# Patient Record
Sex: Male | Born: 1965 | Race: Black or African American | Hispanic: No | Smoking: Current every day smoker
Health system: Southern US, Community
[De-identification: ages and names within clinical notes are randomized; demographics above are authoritative.]

## PROBLEM LIST (undated history)

## (undated) DIAGNOSIS — F32A Depression, unspecified: Secondary | ICD-10-CM

## (undated) DIAGNOSIS — G35 Multiple sclerosis: Secondary | ICD-10-CM

## (undated) DIAGNOSIS — R269 Unspecified abnormalities of gait and mobility: Principal | ICD-10-CM

## (undated) DIAGNOSIS — I10 Essential (primary) hypertension: Secondary | ICD-10-CM

## (undated) DIAGNOSIS — J3089 Other allergic rhinitis: Secondary | ICD-10-CM

## (undated) DIAGNOSIS — Z8719 Personal history of other diseases of the digestive system: Secondary | ICD-10-CM

## (undated) DIAGNOSIS — G8250 Quadriplegia, unspecified: Secondary | ICD-10-CM

## (undated) DIAGNOSIS — N400 Enlarged prostate without lower urinary tract symptoms: Secondary | ICD-10-CM

## (undated) DIAGNOSIS — F329 Major depressive disorder, single episode, unspecified: Secondary | ICD-10-CM

## (undated) HISTORY — DX: Unspecified abnormalities of gait and mobility: R26.9

## (undated) HISTORY — PX: APPENDECTOMY: SHX54

## (undated) HISTORY — PX: HERNIA REPAIR: SHX51

## (undated) HISTORY — PX: OTHER SURGICAL HISTORY: SHX169

## (undated) HISTORY — DX: Quadriplegia, unspecified: G82.50

## (undated) HISTORY — DX: Multiple sclerosis: G35

---

## 1999-11-19 ENCOUNTER — Emergency Department (HOSPITAL_COMMUNITY): Admission: EM | Admit: 1999-11-19 | Discharge: 1999-11-19 | Payer: Self-pay | Admitting: Emergency Medicine

## 1999-12-16 ENCOUNTER — Emergency Department (HOSPITAL_COMMUNITY): Admission: EM | Admit: 1999-12-16 | Discharge: 1999-12-16 | Payer: Self-pay | Admitting: Emergency Medicine

## 1999-12-30 ENCOUNTER — Emergency Department (HOSPITAL_COMMUNITY): Admission: EM | Admit: 1999-12-30 | Discharge: 1999-12-30 | Payer: Self-pay | Admitting: Emergency Medicine

## 1999-12-31 ENCOUNTER — Emergency Department (HOSPITAL_COMMUNITY): Admission: EM | Admit: 1999-12-31 | Discharge: 1999-12-31 | Payer: Self-pay

## 2000-01-06 ENCOUNTER — Emergency Department (HOSPITAL_COMMUNITY): Admission: EM | Admit: 2000-01-06 | Discharge: 2000-01-06 | Payer: Self-pay | Admitting: Emergency Medicine

## 2000-01-14 ENCOUNTER — Emergency Department (HOSPITAL_COMMUNITY): Admission: EM | Admit: 2000-01-14 | Discharge: 2000-01-14 | Payer: Self-pay | Admitting: Emergency Medicine

## 2000-01-23 ENCOUNTER — Encounter: Payer: Self-pay | Admitting: Orthopedic Surgery

## 2000-01-23 ENCOUNTER — Ambulatory Visit (HOSPITAL_COMMUNITY): Admission: RE | Admit: 2000-01-23 | Discharge: 2000-01-23 | Payer: Self-pay | Admitting: Orthopedic Surgery

## 2000-02-05 ENCOUNTER — Encounter: Payer: Self-pay | Admitting: Emergency Medicine

## 2000-02-05 ENCOUNTER — Emergency Department (HOSPITAL_COMMUNITY): Admission: EM | Admit: 2000-02-05 | Discharge: 2000-02-05 | Payer: Self-pay | Admitting: Emergency Medicine

## 2000-02-11 ENCOUNTER — Encounter: Payer: Self-pay | Admitting: Emergency Medicine

## 2000-02-11 ENCOUNTER — Emergency Department (HOSPITAL_COMMUNITY): Admission: EM | Admit: 2000-02-11 | Discharge: 2000-02-11 | Payer: Self-pay | Admitting: Emergency Medicine

## 2000-02-18 ENCOUNTER — Emergency Department (HOSPITAL_COMMUNITY): Admission: EM | Admit: 2000-02-18 | Discharge: 2000-02-18 | Payer: Self-pay | Admitting: *Deleted

## 2000-02-18 ENCOUNTER — Emergency Department (HOSPITAL_COMMUNITY): Admission: EM | Admit: 2000-02-18 | Discharge: 2000-02-18 | Payer: Self-pay | Admitting: Emergency Medicine

## 2000-02-23 ENCOUNTER — Emergency Department (HOSPITAL_COMMUNITY): Admission: EM | Admit: 2000-02-23 | Discharge: 2000-02-23 | Payer: Self-pay | Admitting: Emergency Medicine

## 2000-02-23 ENCOUNTER — Ambulatory Visit (HOSPITAL_COMMUNITY): Admission: RE | Admit: 2000-02-23 | Discharge: 2000-02-23 | Payer: Self-pay | Admitting: Orthopedic Surgery

## 2000-02-23 ENCOUNTER — Encounter: Payer: Self-pay | Admitting: Orthopedic Surgery

## 2000-03-09 ENCOUNTER — Emergency Department (HOSPITAL_COMMUNITY): Admission: EM | Admit: 2000-03-09 | Discharge: 2000-03-09 | Payer: Self-pay | Admitting: Emergency Medicine

## 2000-03-22 ENCOUNTER — Emergency Department (HOSPITAL_COMMUNITY): Admission: EM | Admit: 2000-03-22 | Discharge: 2000-03-22 | Payer: Self-pay

## 2000-04-27 HISTORY — PX: LUMBAR DISC SURGERY: SHX700

## 2000-05-18 ENCOUNTER — Emergency Department (HOSPITAL_COMMUNITY): Admission: EM | Admit: 2000-05-18 | Discharge: 2000-05-18 | Payer: Self-pay | Admitting: Emergency Medicine

## 2000-05-28 ENCOUNTER — Ambulatory Visit (HOSPITAL_COMMUNITY): Admission: RE | Admit: 2000-05-28 | Discharge: 2000-05-28 | Payer: Self-pay | Admitting: *Deleted

## 2000-05-28 ENCOUNTER — Encounter: Payer: Self-pay | Admitting: *Deleted

## 2000-05-31 ENCOUNTER — Emergency Department (HOSPITAL_COMMUNITY): Admission: EM | Admit: 2000-05-31 | Discharge: 2000-05-31 | Payer: Self-pay | Admitting: Emergency Medicine

## 2000-06-21 ENCOUNTER — Emergency Department (HOSPITAL_COMMUNITY): Admission: EM | Admit: 2000-06-21 | Discharge: 2000-06-21 | Payer: Self-pay | Admitting: Emergency Medicine

## 2000-06-26 ENCOUNTER — Emergency Department (HOSPITAL_COMMUNITY): Admission: EM | Admit: 2000-06-26 | Discharge: 2000-06-26 | Payer: Self-pay | Admitting: Emergency Medicine

## 2000-09-09 ENCOUNTER — Emergency Department (HOSPITAL_COMMUNITY): Admission: EM | Admit: 2000-09-09 | Discharge: 2000-09-09 | Payer: Self-pay | Admitting: Emergency Medicine

## 2000-09-13 ENCOUNTER — Emergency Department (HOSPITAL_COMMUNITY): Admission: EM | Admit: 2000-09-13 | Discharge: 2000-09-13 | Payer: Self-pay

## 2000-10-13 ENCOUNTER — Ambulatory Visit (HOSPITAL_COMMUNITY): Admission: RE | Admit: 2000-10-13 | Discharge: 2000-10-13 | Payer: Self-pay | Admitting: Neurosurgery

## 2000-10-14 ENCOUNTER — Encounter: Payer: Self-pay | Admitting: Neurosurgery

## 2000-11-02 ENCOUNTER — Inpatient Hospital Stay (HOSPITAL_COMMUNITY): Admission: RE | Admit: 2000-11-02 | Discharge: 2000-11-03 | Payer: Self-pay | Admitting: Neurosurgery

## 2000-11-02 ENCOUNTER — Encounter: Payer: Self-pay | Admitting: Neurosurgery

## 2000-11-02 HISTORY — PX: LUMBAR DISC SURGERY: SHX700

## 2001-03-08 ENCOUNTER — Encounter: Payer: Self-pay | Admitting: Emergency Medicine

## 2001-03-08 ENCOUNTER — Emergency Department (HOSPITAL_COMMUNITY): Admission: EM | Admit: 2001-03-08 | Discharge: 2001-03-08 | Payer: Self-pay | Admitting: Emergency Medicine

## 2001-10-14 ENCOUNTER — Emergency Department (HOSPITAL_COMMUNITY): Admission: EM | Admit: 2001-10-14 | Discharge: 2001-10-14 | Payer: Self-pay | Admitting: Emergency Medicine

## 2002-11-19 ENCOUNTER — Emergency Department (HOSPITAL_COMMUNITY): Admission: EM | Admit: 2002-11-19 | Discharge: 2002-11-19 | Payer: Self-pay | Admitting: Emergency Medicine

## 2002-11-22 ENCOUNTER — Emergency Department (HOSPITAL_COMMUNITY): Admission: EM | Admit: 2002-11-22 | Discharge: 2002-11-22 | Payer: Self-pay | Admitting: Emergency Medicine

## 2002-11-26 ENCOUNTER — Emergency Department (HOSPITAL_COMMUNITY): Admission: EM | Admit: 2002-11-26 | Discharge: 2002-11-26 | Payer: Self-pay | Admitting: Emergency Medicine

## 2004-04-27 HISTORY — PX: SEPTOPLASTY: SUR1290

## 2004-09-24 ENCOUNTER — Emergency Department (HOSPITAL_COMMUNITY): Admission: EM | Admit: 2004-09-24 | Discharge: 2004-09-24 | Payer: Self-pay | Admitting: Emergency Medicine

## 2004-10-08 ENCOUNTER — Ambulatory Visit (HOSPITAL_COMMUNITY): Admission: RE | Admit: 2004-10-08 | Discharge: 2004-10-08 | Payer: Self-pay | Admitting: Otolaryngology

## 2004-10-08 HISTORY — PX: CLOSED REDUCTION NASAL FRACTURE: SUR256

## 2005-03-25 ENCOUNTER — Emergency Department (HOSPITAL_COMMUNITY): Admission: EM | Admit: 2005-03-25 | Discharge: 2005-03-25 | Payer: Self-pay | Admitting: Emergency Medicine

## 2005-07-21 ENCOUNTER — Emergency Department (HOSPITAL_COMMUNITY): Admission: EM | Admit: 2005-07-21 | Discharge: 2005-07-21 | Payer: Self-pay | Admitting: Emergency Medicine

## 2006-04-27 HISTORY — PX: ORIF ANKLE FRACTURE: SUR919

## 2006-04-27 HISTORY — PX: ORIF HUMERUS FRACTURE: SHX2126

## 2006-07-21 ENCOUNTER — Emergency Department (HOSPITAL_COMMUNITY): Admission: EM | Admit: 2006-07-21 | Discharge: 2006-07-21 | Payer: Self-pay | Admitting: Emergency Medicine

## 2006-07-27 ENCOUNTER — Emergency Department (HOSPITAL_COMMUNITY): Admission: EM | Admit: 2006-07-27 | Discharge: 2006-07-27 | Payer: Self-pay | Admitting: Emergency Medicine

## 2006-09-06 ENCOUNTER — Ambulatory Visit: Payer: Self-pay | Admitting: Family Medicine

## 2006-09-10 ENCOUNTER — Ambulatory Visit (HOSPITAL_COMMUNITY): Admission: RE | Admit: 2006-09-10 | Discharge: 2006-09-10 | Payer: Self-pay | Admitting: Family Medicine

## 2006-09-21 ENCOUNTER — Ambulatory Visit: Payer: Self-pay | Admitting: Family Medicine

## 2006-09-22 ENCOUNTER — Ambulatory Visit: Payer: Self-pay | Admitting: *Deleted

## 2006-11-09 ENCOUNTER — Ambulatory Visit (HOSPITAL_COMMUNITY): Admission: RE | Admit: 2006-11-09 | Discharge: 2006-11-09 | Payer: Self-pay | Admitting: Family Medicine

## 2006-11-22 ENCOUNTER — Ambulatory Visit: Payer: Self-pay | Admitting: Family Medicine

## 2006-11-26 ENCOUNTER — Ambulatory Visit (HOSPITAL_COMMUNITY): Admission: RE | Admit: 2006-11-26 | Discharge: 2006-11-26 | Payer: Self-pay | Admitting: Family Medicine

## 2007-01-13 ENCOUNTER — Ambulatory Visit: Payer: Self-pay | Admitting: Family Medicine

## 2007-01-24 ENCOUNTER — Inpatient Hospital Stay (HOSPITAL_COMMUNITY): Admission: AC | Admit: 2007-01-24 | Discharge: 2007-02-11 | Payer: Self-pay | Admitting: Emergency Medicine

## 2007-01-27 HISTORY — PX: OTHER SURGICAL HISTORY: SHX169

## 2007-03-14 ENCOUNTER — Encounter: Admission: RE | Admit: 2007-03-14 | Discharge: 2007-04-27 | Payer: Self-pay | Admitting: Orthopaedic Surgery

## 2007-03-21 ENCOUNTER — Ambulatory Visit: Payer: Self-pay | Admitting: Family Medicine

## 2007-04-28 HISTORY — PX: SHOULDER SURGERY: SHX246

## 2007-05-04 ENCOUNTER — Encounter: Admission: RE | Admit: 2007-05-04 | Discharge: 2007-06-30 | Payer: Self-pay | Admitting: Orthopaedic Surgery

## 2007-05-10 ENCOUNTER — Inpatient Hospital Stay (HOSPITAL_COMMUNITY): Admission: RE | Admit: 2007-05-10 | Discharge: 2007-05-13 | Payer: Self-pay | Admitting: Orthopaedic Surgery

## 2007-05-10 HISTORY — PX: OTHER SURGICAL HISTORY: SHX169

## 2007-07-12 ENCOUNTER — Encounter: Admission: RE | Admit: 2007-07-12 | Discharge: 2007-08-22 | Payer: Self-pay | Admitting: Orthopaedic Surgery

## 2007-11-03 ENCOUNTER — Ambulatory Visit: Payer: Self-pay | Admitting: Internal Medicine

## 2007-11-03 ENCOUNTER — Encounter (INDEPENDENT_AMBULATORY_CARE_PROVIDER_SITE_OTHER): Payer: Self-pay | Admitting: Family Medicine

## 2007-11-03 LAB — CONVERTED CEMR LAB
ALT: 10 units/L (ref 0–53)
AST: 13 units/L (ref 0–37)
Basophils Absolute: 0 10*3/uL (ref 0.0–0.1)
Basophils Relative: 1 % (ref 0–1)
Calcium: 9.6 mg/dL (ref 8.4–10.5)
Chloride: 110 meq/L (ref 96–112)
Creatinine, Ser: 1.05 mg/dL (ref 0.40–1.50)
MCHC: 33.2 g/dL (ref 30.0–36.0)
Monocytes Absolute: 0.3 10*3/uL (ref 0.1–1.0)
Neutro Abs: 1.5 10*3/uL — ABNORMAL LOW (ref 1.7–7.7)
Neutrophils Relative %: 38 % — ABNORMAL LOW (ref 43–77)
Platelets: 251 10*3/uL (ref 150–400)
Potassium: 4.2 meq/L (ref 3.5–5.3)
RDW: 14.5 % (ref 11.5–15.5)
Sodium: 144 meq/L (ref 135–145)

## 2007-11-15 ENCOUNTER — Ambulatory Visit (HOSPITAL_COMMUNITY): Admission: RE | Admit: 2007-11-15 | Discharge: 2007-11-15 | Payer: Self-pay | Admitting: Family Medicine

## 2008-05-24 ENCOUNTER — Ambulatory Visit: Payer: Self-pay | Admitting: Family Medicine

## 2008-07-18 ENCOUNTER — Ambulatory Visit (HOSPITAL_COMMUNITY): Admission: RE | Admit: 2008-07-18 | Discharge: 2008-07-18 | Payer: Self-pay | Admitting: Family Medicine
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
## 2009-05-20 ENCOUNTER — Encounter: Admission: RE | Admit: 2009-05-20 | Discharge: 2009-05-20 | Payer: Self-pay | Admitting: Family Medicine

## 2010-01-29 ENCOUNTER — Emergency Department (HOSPITAL_COMMUNITY): Admission: EM | Admit: 2010-01-29 | Discharge: 2010-01-29 | Payer: Self-pay | Admitting: Emergency Medicine
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
## 2010-09-09 NOTE — Op Note (Signed)
[name], [name] NO.:  [id number]   MEDICAL RECORD NO.:  [id number]          PATIENT TYPE:  INP   LOCATION:  5020                         FACILITY:  [facility]   PHYSICIAN:  Vanita Panda. Magnus Ivan, M.D.DATE OF BIRTH:  [birth year]   DATE OF PROCEDURE:  02/07/2007  DATE OF DISCHARGE:                               OPERATIVE REPORT   PREOPERATIVE DIAGNOSES:  Right shoulder infection status post open  reduction and internal fixation.   POSTOPERATIVE DIAGNOSES:  Right shoulder infection status post open  reduction and internal fixation.   FINDINGS:  Gross purulence right shoulder wound.   SURGEON:  Vanita Panda. Magnus Ivan, MD.   ANESTHESIA:  General.   ESTIMATED BLOOD LOSS:  100 mL.   COMPLICATIONS:  None.   INDICATIONS:  [name] is a 45 year old who is 11 days post open  reduction and internal fixation of a comminuted right proximal humerus  fracture.  Over the last 2 days while he was in the hospital, he  developed pain and drainage from the incision site. The peripheral white  blood cell count was elevated and his platelets were elevated as well.  Due to drainage from this area, I elected to bring him back to the  operating room for irrigation and debridement.  The risks and benefits  of this were explained to him and well understood and he agreed to  proceed with surgery.  Of note, while he was on the floor, due to his  increasing white blood cell count before I could get the surgery  scheduled I did start him on IV Ancef.   DESCRIPTION OF PROCEDURE:  After informed consent was obtained, the  appropriate right shoulder was marked, he was brought to the operating  room, placed supine on the operating table.  General anesthesia was then  obtained.  I then prepped the shoulder out with DuraPrep including a  sterile stockinette and sterile drapes. A timeout was called to identify  the correct patient and the correct extremity.  I then removed the  staples from the previous incision site and removed the first layer of  sutures. Gross purulence was encountered and a large effusion. I  suctioned this out. I was able to see that the plate was intact at the  shoulder itself.  I then removed just minimal necrotic tissue. Using a  Pulsavac, I  then Pulsavaced the wound with 3 liters of normal saline  solution followed by 500 mL of bacitracin solution.  A medium Hemovac  was placed deeply and I closed the tissue deeply with interrupted #0 PDS  suture followed by 2-0 PDS suture in the subcutaneous tissue and  interrupted 2-0 nylon on the  skin. Adaptic followed by a well-padded sterile dressing was applied and  the patient was awakened, extubated and taken to the recovery room in  stable condition.  Postoperatively, I will keep him on IV antibiotics  and he will likely remain hospitalized for several days before  determining the duration and type of antibiotics.      Vanita Panda. Magnus Ivan, M.D.  Electronically Signed     CYB/MEDQ  D:  02/07/2007  T:  02/08/2007  Job:  [id number]

## 2010-09-12 NOTE — Consult Note (Signed)
NAME:  JANZIEL, HOCKETT NO.:  [id number]   MEDICAL RECORD NO.:  [id number]          PATIENT TYPE:  EMS   LOCATION:  MAJO                         FACILITY:  [facility]   PHYSICIAN:  Coletta Memos, M.D.     DATE OF BIRTH:  1965/05/23   DATE OF CONSULTATION:  DATE OF DISCHARGE:  03/25/2005                                   CONSULTATION   INDICATIONS:  1.  Ms. Kayveon Lennartz is a 45 year old who presents with numbness and      difficulty using hands.  2.  Episodes of urinary incontinence.   HISTORY OF PRESENT ILLNESS:  Mr. Oguinn is a 45 year old gentleman who over  the last three weeks has had a decrease in his strength in his hands,  decrease in the use of his hands also.  He had great difficulty tying his  shoes for example, picking change up from a table. He says he was worse  about a week ago and has made some slight improvement to this day.  He,  however, has also had some episodes of urinary incontinence and has soiled  himself.  He does feel the need to go, but he finds it very difficult to  control the urge.  He has been relatively constipated over this period of  time also.  Prior to this he had no problems with urinary function or bowel  function.  He has never had problems like this in the upper extremities.  He  does have a history of lumbar surgery by Dr. Darreld Mclean at L4-5 due to a  herniated disk.  He currently is working for the city, I believe in lawn  care.  He has had some difficulty with his gait.  He notices himself  stumbling often times to the left side.  He says it appears as if he is  drunk on occasion.   MEDICATIONS:  He is taking no medications.   ALLERGIES:  MORPHINE.   SOCIAL HISTORY:  Past history of cocaine abuse.   FAMILY HISTORY:  Family history of cerebrovascular disease and has had a  number of relatives with cerebrovascular accidents.  He does use tobacco and  has used it for approximately 20 years.   PHYSICAL EXAMINATION:   VITAL SIGNS:  Temperature of 97.4, pulse of 51, blood  pressure 129/82, respiratory rate of 20.  GENERAL:  He is alert and oriented x4 and answers all questions  appropriately.  NEUROLOGIC:  He is hyperreflexic in both the upper and lower extremities.  He has suprapatellar responses.  He has some strength to his finger flexors  from the brachioradialis. He has bilateral Hoffman's sign.  No clonus is  elicited.  Toes are downgoing.  Good plantar simulation.  Pinprick and  proprioception were intact in the upper and lower extremities.  He has  positive Romberg test.  He is unable to tandem walk without sticking his arm  out to hold onto a wall or bed for balance.  Muscle tone and bulk are  normal.  He has some deficits in the fine motor movement.  Taking off his  socks was difficult for him.   I believe that Mr. Dohrman has some component of cervical spinal cord  compression.  He is uninsured and is I would inform that we will be able to  get a scan done at [facility] for less than what he would have to pay.  He also makes it clear that his function is better now than it was  approximately a week ago and he certainly has had improvement.  He no longer  has any pain.  At this point he is simply having a great deal of numbness.  I am therefore going to arrange for him to get an MRI done as an outpatient  and based on this make treatment  recommendations.  He is unable to stay in the hospital overnight and says he  has to work and he works for a Product/process development scientist.  He says if he  does not work he will lose his job.  Given that I think it best that we get  the scan as an outpatient.           ______________________________  Coletta Memos, M.D.     KC/MEDQ  D:  03/25/2005  T:  03/26/2005  Job:  [id number]

## 2010-09-12 NOTE — Discharge Summary (Signed)
[place]. [facility]  Patient:    [name]                     MRN: [id number] Adm. Date:  [id number] Disc. Date: 11/03/00 Attending:  Jackelyn Knife                           Discharge Summary  HISTORY OF PRESENT ILLNESS: 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  Subsequent MRI demonstrated a ruptured intervertebral disk on the left at L4-5.  HOSPITAL COURSE: After appropriate preoperative work-up including an explanation of the goals and risks of the procedure, he was taken to the operating room on November 02, 2000 and underwent a left L4-5 hemilaminectomy and diskectomy, with the finding of a rather large ruptured disk.  He did well postoperatively.  At the time of discharge, November 03, 2000, the incision was healing well and he was doing well.  FINAL DIAGNOSIS: Ruptured intervertebral disk, left L4-5.  DISCHARGE CONDITION: Improving.  DISCHARGE ACTIVITY: Up ad lib with a ten pound weight lifting restriction for four weeks.  DISCHARGE DIET: Regular.  FOLLOW-UP: Back to see me or Dr. Phoebe Perch in a week for suture removal.  DISCHARGE MEDICATIONS: Percocet p.r.n. DD:  11/03/00 TD:  11/03/00 Job: [id number]

## 2010-09-12 NOTE — H&P (Signed)
[place]. [facility]  Patient:    [name], [name]                     MRN: [id number] Adm. Date:  [id number] Disc. Date: [id number] Attending:  Armanda Heritage                         History and Physical  CHIEF COMPLAINT: [name] is a generally healthy 45 year old male, who was seen initially about a month or so ago with a chief complaint of back pain with bilateral leg involvement.  HISTORY OF PRESENT ILLNESS: He had been followed and treated appropriately with conservative measures by Dr. Parke Simmers.  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  The injury occurred on November 21, 1999.  He was lifting a heavy stone at that time and injured his back and developed bilateral leg pain afterward, which also has included some left buttock area pain.  He was followed conservatively and then eventually had an MRI study done in October 2001.  This study showed a disk rupture at the L5-S1 level, but this was not treated surgically and he continued to be followed. He has had ongoing back and leg pain and recently has noted increased back and bilateral leg pain, made worse with activity and relieved to some extent with reduced activity and analgesics.  He has had no difficulty with bowel or bladder control.  He underwent a repeat lumbosacral MRI at [facility] on October 14, 2000.  This study showed improvement of the previously noted disk herniation at L5-S1, but also noted a new problem with a rather large sized disk rupture at L4-5.  He was followed conservatively for a while and then he was asked to increase his activity to see what effect this might have and to see if we could make some attempt to get him back to work.  With increased activity around the house he had an increase in the symptoms of back and bilateral leg pain, and because of that called and wanted to  go ahead with the previously described surgical decompression at L4-5.  PAST MEDICAL HISTORY: He has generally been healthy.  He has had some stress headaches.  PAST SURGICAL HISTORY:  1. Appendectomy.  2. Hernia repair.  3. Epidurals tried for his back pain, which were of minimal help.  FAMILY HISTORY: His mother is living but has had a stroke.  His father died in a car wreck.  He has a 104 year old brother living and well.  He had a second brother, who died of an accidental gunshot wound 28 years ago.  He has a 54 year old child living and well.  CURRENT MEDICATIONS:  1. Vicodin.  2. Flexeril.  3. Ibuprofen.  4. Vioxx.  5. Celebrex (though not at the same time as Vioxx).  ALLERGIES: MORPHINE.  SOCIAL HISTORY: He smokes half a packs of cigarettes a day and drinks beer in moderation.  REVIEW OF SYSTEMS: Pertinent as mentioned.  PHYSICAL EXAMINATION:  GENERAL: He is alert and cooperative.  VITAL SIGNS: Weight 186 pounds.  Height 6 feet 1 inch.  Blood pressure 119/76, pulse 61.  He is afebrile.  HEENT: Normal.  NECK: No cervical or supraclavicular bruits, or mass lesions.  CHEST: Clear.  CARDIAC: No murmurs or enlargement.  ABDOMEN: Soft, nontender, no palpable masses.  NEUROLOGIC: Sensorium and cranial  nerves intact.  Upper extremities are normal in all regards, with good strength and normal sensation and normal reflexes. Straight leg raising on the right side produces a rather severe increase in back pain but no increased leg pain.  Straight leg raising on the left did not tend to accentuate back pain.  He has good peripheral pulses.  Sensory examination showed some spotty hypesthesia but nothing that was reliably reproducible.  Deep tendon reflexes were about 1+ and roughly symmetrical.  I questioned whether there was some diminution of the right ankle reflex but that on subsequent examinations remained questionable.  IMPRESSION: Disk herniation, L4-5 and  L5-S1, with magnetic resonance imaging improvement of the L5-S1 disk herniation and the L4-5 problem is new.  Since he has not responded to conservative measures surgical decompression is planned at this time.  PLAN: Careful explanation of the goals and risks of the procedure was carried out with the patient.  He understands the risks include the risk of infection, bleeding, damage to the common dural tube with CSF leak.  There can be damage to nerve roots with subsequent weakness or loss of sensation. DD:  11/02/00 TD:  11/02/00 Job: [id number]

## 2010-09-12 NOTE — Op Note (Signed)
[place]. [facility]  Patient:    NGUYEN, TODOROV                     MRN: [id number] Proc. Date: 11/02/00 Adm. Date:  [id number] Attending:  Jackelyn Knife                           Operative Report  PREOPERATIVE DIAGNOSIS:  Herniated intervertebral disk left L4-5.  POSTOPERATIVE DIAGNOSIS:  Herniated intervertebral disk left L4-5.  PROCEDURE:  Left L4-5 hemilaminotomy and diskectomy.  SURGEON:  Izell Montpelier. Elesa Hacker, M.D.  ASSISTANT:  Garlon Hatchet., M.D.  INDICATIONS:  Mr. Cavion Faiola is a 45 year old man who presented with a several-month history of low back and left leg pain.  This did not respond to conservative measures.  He had a test period of increased activity, which greatly exacerbated his discomfort in both his low back and left leg, and he also began to develop some dorsiflexor weakness involving the great toe and foot on that side.  Because of progression of his problem, he was decompressed in the manner described below.  DESCRIPTION OF PROCEDURE:  Under general endotracheal anesthesia, the usual subperiosteal approach was carried out to the left L4-5 interlaminar space.  A proper operative level was confirmed with intraoperative x-rays.  Using the operating microscope and high-speed drill, a hemilaminotomy was performed.  Ligamentum flavum was excised, and the dura and _____ nerve root were identified.  With retraction of these structures medially, a rather large, well-localized disk protrusion was identified.  The greatly-thinned annulus was incised with a #15 blade, and a moderately large quantity of degenerative disk material was removed from the interspace.  This afforded excellent decompression of the dural sac and nerve root.  A careful search was made for distal extruded fragments, and none were found. The wound was irrigated with antibiotic solution, and hemostasis was obtained. The wound was then closed in layers and a  sterile dressing applied.  The patient was taken to the recovery room in good condition. DD:  11/02/00 TD:  11/03/00 Job: [id number]
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
## 2011-01-14 LAB — CBC
HCT: 39.1
MCV: 89.3
Platelets: 248
RDW: 14.6

## 2011-01-14 LAB — BASIC METABOLIC PANEL
BUN: 12
GFR calc non Af Amer: >60
Glucose, Bld: 87
Potassium: 4.2

## 2011-02-04 LAB — PROTIME-INR
INR: 1.3
INR: 1.3
Prothrombin Time: 16.2 — ABNORMAL HIGH
Prothrombin Time: 16.4 — ABNORMAL HIGH
Prothrombin Time: 16.5 — ABNORMAL HIGH

## 2011-02-04 LAB — CBC
Hemoglobin: 9.1 — ABNORMAL LOW
MCHC: 33.2
Platelets: 1067
RBC: 2.83 — ABNORMAL LOW
RDW: 14.4 — ABNORMAL HIGH
RDW: 14.6 — ABNORMAL HIGH
WBC: 10.1

## 2011-02-05 LAB — CBC
HCT: 23.1 — ABNORMAL LOW
HCT: 26 — ABNORMAL LOW
HCT: 27.4 — ABNORMAL LOW
HCT: 27.9 — ABNORMAL LOW
HCT: 31.7 — ABNORMAL LOW
HCT: 26.6 — ABNORMAL LOW
HCT: 38.4 — ABNORMAL LOW
Hemoglobin: 10.4 — ABNORMAL LOW
Hemoglobin: 10.5 — ABNORMAL LOW
Hemoglobin: 7.9 — CL
Hemoglobin: 8.8 — ABNORMAL LOW
Hemoglobin: 9.1 — ABNORMAL LOW
Hemoglobin: 9.1 — ABNORMAL LOW
Hemoglobin: 9.2 — ABNORMAL LOW
Hemoglobin: 12.9 — ABNORMAL LOW
Hemoglobin: 9 — ABNORMAL LOW
MCHC: 33.6
MCHC: 33.6
MCHC: 34.1
MCV: 93.3
MCV: 93.4
MCV: 93.5
MCV: 94.8
Platelets: 1120
Platelets: 241
RBC: 2.44 — ABNORMAL LOW
RBC: 2.78 — ABNORMAL LOW
RBC: 2.82 — ABNORMAL LOW
RBC: 2.86 — ABNORMAL LOW
RBC: 2.97 — ABNORMAL LOW
RBC: 3.34 — ABNORMAL LOW
RBC: 3.4 — ABNORMAL LOW
RDW: 13.5
RDW: 14.2 — ABNORMAL HIGH
RDW: 14.6 — ABNORMAL HIGH
RDW: 14.5 — ABNORMAL HIGH
WBC: 11.3 — ABNORMAL HIGH
WBC: 12.6 — ABNORMAL HIGH
WBC: 12.7 — ABNORMAL HIGH
WBC: 9.2
WBC: 9.5
WBC: 7.9

## 2011-02-05 LAB — BASIC METABOLIC PANEL
CO2: 31
CO2: 24
Chloride: 108
GFR calc Af Amer: >60
GFR calc non Af Amer: >60
GFR calc non Af Amer: >60
Glucose, Bld: 110 — ABNORMAL HIGH
Glucose, Bld: 136 — ABNORMAL HIGH
Potassium: 3.3 — ABNORMAL LOW
Potassium: 3.8
Sodium: 136
Sodium: 140

## 2011-02-05 LAB — SEDIMENTATION RATE: Sed Rate: 101 — ABNORMAL HIGH

## 2011-02-05 LAB — PROTIME-INR
INR: 1.5
INR: 1.9 — ABNORMAL HIGH
INR: 1.9 — ABNORMAL HIGH
INR: 2.2 — ABNORMAL HIGH
INR: 2.4 — ABNORMAL HIGH
INR: 2.9 — ABNORMAL HIGH
Prothrombin Time: 22.4 — ABNORMAL HIGH
Prothrombin Time: 22.6 — ABNORMAL HIGH
Prothrombin Time: 26.4 — ABNORMAL HIGH
Prothrombin Time: 26.9 — ABNORMAL HIGH

## 2011-02-05 LAB — WOUND CULTURE: Gram Stain: NONE SEEN

## 2011-02-05 LAB — ANAEROBIC CULTURE

## 2011-02-05 LAB — POCT I-STAT CREATININE
Creatinine, Ser: 1.2
Operator id: 146091

## 2011-02-05 LAB — I-STAT 8, (EC8 V) (CONVERTED LAB)
HCT: 41
Hemoglobin: 13.9
Operator id: 146091
Potassium: 3.5
Sodium: 141
TCO2: 22

## 2011-02-05 LAB — CROSSMATCH
ABO/RH(D): O POS
Antibody Screen: NEGATIVE

## 2011-02-05 LAB — POCT I-STAT 4, (NA,K, GLUC, HGB,HCT)
HCT: 29 — ABNORMAL LOW
Operator id: 219281
Operator id: 117071

## 2011-02-05 LAB — ABO/RH: ABO/RH(D): O POS

## 2011-02-05 LAB — DIFFERENTIAL
Basophils Relative: 1
Eosinophils Relative: 1
Lymphs Abs: 1.6
Monocytes Absolute: 1.6 — ABNORMAL HIGH
Monocytes Relative: 13 — ABNORMAL HIGH
Neutro Abs: 9.2 — ABNORMAL HIGH
Smear Review: INCREASED

## 2011-12-07 ENCOUNTER — Ambulatory Visit (HOSPITAL_COMMUNITY): Payer: Self-pay | Admitting: Physical Therapy

## 2011-12-09 ENCOUNTER — Ambulatory Visit (HOSPITAL_COMMUNITY)
Admission: RE | Admit: 2011-12-09 | Discharge: 2011-12-09 | Disposition: A | Discharge status: Home / Self Care | Payer: Medicaid Other | Source: Ambulatory Visit | Attending: Orthopaedic Surgery | Admitting: Orthopaedic Surgery

## 2011-12-09 DIAGNOSIS — S82201A Unspecified fracture of shaft of right tibia, initial encounter for closed fracture: Secondary | ICD-10-CM | POA: Insufficient documentation

## 2011-12-09 DIAGNOSIS — M79609 Pain in unspecified limb: Secondary | ICD-10-CM | POA: Insufficient documentation

## 2011-12-09 DIAGNOSIS — R262 Difficulty in walking, not elsewhere classified: Secondary | ICD-10-CM | POA: Insufficient documentation

## 2011-12-09 DIAGNOSIS — M6281 Muscle weakness (generalized): Secondary | ICD-10-CM | POA: Insufficient documentation

## 2011-12-09 DIAGNOSIS — IMO0001 Reserved for inherently not codable concepts without codable children: Secondary | ICD-10-CM | POA: Insufficient documentation
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
## 2011-12-11 ENCOUNTER — Ambulatory Visit (HOSPITAL_COMMUNITY)
Admission: RE | Admit: 2011-12-11 | Discharge: 2011-12-11 | Disposition: A | Discharge status: Home / Self Care | Payer: Medicaid Other | Source: Ambulatory Visit | Attending: Orthopaedic Surgery | Admitting: Orthopaedic Surgery

## 2011-12-11 NOTE — Progress Notes (Signed)
Physical Therapy Treatment Patient Details  Name: [name] MRN: [id number] Date of Birth: January 24, 1966  Today's Date: 12/11/2011 Time: 1420-1509 PT Time Calculation (min): 49 min Charges: 17' TE Visit#: 2  of 12  (if able to be covered by [facility] discount)  Re-eval: 01/08/12    Authorization: Medicaid  Authorization Time Period: Pt reports that he spoke with betty and reports he is 100% covered.  Checked Medicaid today, no word.   Authorization Visit#: 1  of 3    Subjective: Symptoms/Limitations Symptoms: I am feeling a little tired today, but I am ready to work.   Exercise/Treatments Aerobic Stationary Bike: 8' @ 3.0 for acitivity tolerance Machines for Strengthening Cybex Knee Extension: 2.5 PL 2x15 Cybex Knee Flexion: 4.5 PL 2x15 Standing Heel Raises: 15 reps;Limitations Heel Raises Limitations: Toe Raises: 15x Lateral Step Up: Right;15 reps;Step Height: 4";Hand Hold: 1 Forward Step Up: Right;15 reps;Hand Hold: 1;Step Height: 4" Functional Squat: 15 reps;Limitations Functional Squat Limitations: cueing for technique Rocker Board: 2 minutes (w/finger touch only) Other Standing Knee Exercises: 1 RT each: tandem Gait, heel walking Supine Straight Leg Raises: AROM;15 reps;Right Sidelying Hip ABduction: Right;20 reps Hip ADduction: Right;15 reps Prone  Hip Extension: Right;15 reps  Physical Therapy Assessment and Plan PT Assessment and Plan Clinical Impression Statement: Pt is motivated to improve his LE strength, he is limited by functional RLE DF which is was aware of during entire treatment.  Requires mod A for most balance activities.  Able to independently demonstrate HEP. PT Plan: Cont to progress LE strength.  Add stool scoots, grn ball bridges, step downs, sports cord when able    Goals    Problem List Patient Active Problem List  Diagnosis  . Fracture of fibula with tibia, right, closed  . Muscle weakness (generalized)  . Difficulty in walking    PT -  End of Session Activity Tolerance: Patient limited by fatigue  Mat Stuard, PT 12/11/2011, 3:11 PM

## 2011-12-16 ENCOUNTER — Ambulatory Visit (HOSPITAL_COMMUNITY): Payer: Medicaid Other | Admitting: Physical Therapy

## 2011-12-18 ENCOUNTER — Ambulatory Visit (HOSPITAL_COMMUNITY)
Admission: RE | Admit: 2011-12-18 | Discharge: 2011-12-18 | Disposition: A | Discharge status: Home / Self Care | Payer: Medicaid Other | Source: Ambulatory Visit | Attending: Internal Medicine | Admitting: Internal Medicine

## 2011-12-18 NOTE — Progress Notes (Signed)
Physical Therapy Treatment Patient Details  Name: OREN BARELLA MRN: [id number] Date of Birth: [birth year]  Today's Date: 12/18/2011 Time: 1008-1059 PT Time Calculation (min): 51 min Charges: 87' TE Visit#: 3  of 12  (if able to be covered by [facility] discount)  Re-eval: 01/08/12    Authorization: Medicaid  Authorization Time Period: Pt reports that he sent in paper work to with betty and should hear back today about official coverage.  Medicaid approved 3 visits.   Authorization Visit#: 2  of 3    Subjective: Symptoms/Limitations Symptoms: Pt reports he is doing well, but his legs still remain tired.  Pain Assessment Currently in Pain?: No/denies  Exercise/Treatments Aerobic Stationary Bike: 8' @ 5.0 for acitivity tolerance Elliptical: 5 min @ 1.0 at end of tx Machines for Strengthening Cybex Knee Extension: 2.5pl x15, 3.5pl x10, 4.5pl x8 Cybex Knee Flexion: 4.5 pl x15, 5.5 Pl x10, 5.5 pl x8 Standing Lateral Step Up: Right;15 reps;Hand Hold: 1;Step Height: 6" Forward Step Up: Right;15 reps;Hand Hold: 0;Step Height: 6" Functional Squat: 20 reps;Other (comment) (w/orange theraball lift off the ground) Gait Training: 2 RT heel walking, 1 RT: retro gait, tandem gait.  independent gait:     Physical Therapy Assessment and Plan PT Assessment and Plan Clinical Impression Statement: Pt able to complete all exercises today with 1 RB.  He continues to have the greatest amount of difficulty with his balance.  Able to increase step heing to 6 inches and pt able to complete forward step without UE A.  PT Plan: Cont to progress LE strength.  Add stool scoots, grn ball bridges, step downs, sports cord when able    Goals    Problem List Patient Active Problem List  Diagnosis  . Fracture of fibula with tibia, right, closed  . Muscle weakness (generalized)  . Difficulty in walking    PT - End of Session Activity Tolerance: Patient limited by fatigue  Zethan Alfieri, PT 12/18/2011, 12:31  PM

## 2011-12-21 ENCOUNTER — Ambulatory Visit (HOSPITAL_COMMUNITY): Payer: Medicaid Other | Admitting: Physical Therapy

## 2011-12-23 ENCOUNTER — Ambulatory Visit (HOSPITAL_COMMUNITY): Payer: Medicaid Other

## 2011-12-25 ENCOUNTER — Ambulatory Visit (HOSPITAL_COMMUNITY)
Admission: RE | Admit: 2011-12-25 | Discharge: 2011-12-25 | Disposition: A | Discharge status: Home / Self Care | Payer: Medicaid Other | Source: Ambulatory Visit | Attending: Internal Medicine | Admitting: Internal Medicine

## 2011-12-25 NOTE — Progress Notes (Signed)
Physical Therapy Treatment Patient Details  Name: [name] MRN: [id number] Date of Birth: 1965-08-02  Today's Date: 12/25/2011 Time: 1430-1525 PT Time Calculation (min): 55 min  Visit#: 4  of 12   Re-eval: 01/08/12  Charge: therex 38', NMR 15'   Authorization: Medicaid  Authorization Time Period: Pt reports that he sent in paper work to with betty and should hear back today about official coverage. Medicaid approved 3 visits.  Authorization Visit#: 3  of 3    Subjective: Symptoms/Limitations Symptoms: Pt reports he is doing good, no pain today.  Still feels balance is off. Pain Assessment Currently in Pain?: No/denies  Objective:   Exercise/Treatments Aerobic Elliptical: 5 min @ 1.0 at end of tx Machines for Strengthening Cybex Knee Extension: 3.5Pl 15x; 4Pl 12x, 5Pl 10x  Cybex Knee Flexion: 4.5 pl x15, 5.5 Pl x10, 6.5 pl x10 Standing Lateral Step Up: Right;15 reps;Hand Hold: 1;Step Height: 6" Forward Step Up: Right;15 reps;Hand Hold: 1;Step Height: 6" Step Down: Right;10 reps;Hand Hold: 1;Step Height: 6" Gait Training: 2 RT heel walking, 2 RT: retro gait, tandem gait.  independent gait:  Other Standing Knee Exercises: sit to stand 3x- did require HHA descending Seated Stool Scoot - Round Trips: push and pull 2 RT on carpet    Physical Therapy Assessment and Plan PT Assessment and Plan Clinical Impression Statement: Pt with good control noted with all therex, added forward step down with 6 in with 1 HHA for control.  Balance continues to be pt.'s greatest deficit,  requires min-mod assistance for LOB episodes and cueing for spatial awareness  Strength is improving, able to increase weight with cybex machines noted increased quad and hamstring fatigue following activity. PT Plan: Continue to progress LE strength and balance.  Add sit to stands with cueing for control descent, green ball bridges and sports cord next session.    Goals    Problem List Patient  Active Problem List  Diagnosis  . Fracture of fibula with tibia, right, closed  . Muscle weakness (generalized)  . Difficulty in walking    PT - End of Session Activity Tolerance: Patient tolerated treatment well;Patient limited by fatigue General Behavior During Session: [facility] for tasks performed Cognition: [facility] for tasks performed  GP    Juel Burrow 12/25/2011, 4:02 PM

## 2011-12-29 ENCOUNTER — Ambulatory Visit (HOSPITAL_COMMUNITY): Payer: Medicaid Other

## 2011-12-31 ENCOUNTER — Ambulatory Visit (HOSPITAL_COMMUNITY)
Admission: RE | Admit: 2011-12-31 | Discharge: 2011-12-31 | Disposition: A | Discharge status: Home / Self Care | Payer: Medicaid Other | Source: Ambulatory Visit | Attending: Orthopaedic Surgery | Admitting: Orthopaedic Surgery

## 2011-12-31 DIAGNOSIS — IMO0001 Reserved for inherently not codable concepts without codable children: Secondary | ICD-10-CM | POA: Insufficient documentation

## 2011-12-31 DIAGNOSIS — M79609 Pain in unspecified limb: Secondary | ICD-10-CM | POA: Insufficient documentation

## 2011-12-31 DIAGNOSIS — R262 Difficulty in walking, not elsewhere classified: Secondary | ICD-10-CM | POA: Insufficient documentation

## 2011-12-31 DIAGNOSIS — M6281 Muscle weakness (generalized): Secondary | ICD-10-CM | POA: Insufficient documentation

## 2011-12-31 NOTE — Progress Notes (Signed)
Physical Therapy Treatment Patient Details  Name: [name] MRN: [id number] Date of Birth: 06-18-1965  Today's Date: 12/31/2011 Time: 4098-1191 PT Time Calculation (min): 51 min  Visit#: 5  of 12   Re-eval: 01/08/12  Charge: therex 23', NMR 28'  Authorization: Medicaid  Authorization Time Period: Pt reports has contacted finanical aide and completed the paperwork.  Authorization Visit#: 4  of 11    Subjective: Symptoms/Limitations Symptoms: Pt reports he is tired today, had a lot of activities to complete today.  Pt plans to return to the [facility] for strengthening next week.  Objective:   Exercise/Treatments Aerobic Elliptical: 8' @ 1.0 for activity tolerance  Machines for Strengthening Cybex Knee Extension: 4Pl 15x; 5Pl 12x, 5.5Pl 10x  Cybex Knee Flexion: 5.0 pl x15, 6.0 Pl x10, 7.5 pl x10 Standing Heel Raises: 20 reps;Limitations Heel Raises Limitations: Toe Raises: 20x Functional Squat: 10 reps;5 seconds;Limitations Functional Squat Limitations: increased hold time to control descent Gait Training: 2 RT down carpet for heel to toe gait and to bend knees Other Standing Knee Exercises: 5 STS without HHA with foam with max cueing to control descent Other Standing Knee Exercises: 2 RT tandem gait with 30" stance, retro tandem gait  Physical Therapy Assessment and Plan PT Assessment and Plan Clinical Impression Statement: Pt with improved descent control with sit to stands following multimodal cueing for proper form and technique with functional squats.  Pt able to STS without HHA following instructions.  Balance contrinues to be pt.'s greatest deficiit, pt able to tandem stance independently followiong cues for spatial awareness.  LE strength is improving, able to increase weight with cybes, pt plans to return to the [facility] next week, pt was given worksheet for appropriate weight and reps for quad and hamstring machines.  Pt improving activity tolerance, able to ride elliptical  for 8 minutes at end of session though limited by fatigue.   PT Plan: Continue to progress LE strength and balance.  Begin adduction isometric bridges and sports cord next session.    Goals    Problem List Patient Active Problem List  Diagnosis  . Fracture of fibula with tibia, right, closed  . Muscle weakness (generalized)  . Difficulty in walking    PT - End of Session Equipment Utilized During Treatment: Gait belt Activity Tolerance: Patient tolerated treatment well;Patient limited by fatigue General Behavior During Session: [facility] for tasks performed Cognition: [facility] for tasks performed  GP    Juel Burrow 12/31/2011, 5:18 PM

## 2012-01-04 ENCOUNTER — Ambulatory Visit (HOSPITAL_COMMUNITY): Payer: Medicaid Other | Admitting: Physical Therapy

## 2012-01-06 ENCOUNTER — Ambulatory Visit (HOSPITAL_COMMUNITY): Payer: Medicaid Other

## 2012-01-08 ENCOUNTER — Ambulatory Visit (HOSPITAL_COMMUNITY): Payer: Medicaid Other

## 2012-01-11 ENCOUNTER — Ambulatory Visit (HOSPITAL_COMMUNITY): Payer: Medicaid Other | Admitting: Physical Therapy

## 2012-01-12 ENCOUNTER — Ambulatory Visit (HOSPITAL_COMMUNITY)
Admission: RE | Admit: 2012-01-12 | Discharge: 2012-01-12 | Disposition: A | Discharge status: Home / Self Care | Payer: Medicaid Other | Source: Ambulatory Visit | Attending: Internal Medicine | Admitting: Internal Medicine

## 2012-01-12 NOTE — Progress Notes (Signed)
Physical Therapy Treatment Patient Details  Name: NISHANTH MCCAUGHAN MRN: [id number] Date of Birth: Jul 17, 1965  Today's Date: 01/12/2012 Time: 4696-2952 PT Time Calculation (min): 49 min Charges: 33' NMR, 14' Te Visit#: 6  of 12   Re-eval: 01/08/12    Authorization: Medicaid  Authorization Time Period: Pt reports has contacted finanical aide and completed the paperwork.  Authorization Visit#: 6  of 11    Subjective: Symptoms/Limitations Symptoms: Pt reports that he has not been doing much at home and has not been able to go to the [facility].  He complains of increased knee pain which he attributes to doing more activity Pain Assessment Currently in Pain?: Yes Pain Location: Knee Pain Orientation: Right  Precautions/Restrictions     Exercise/Treatments Stretches Quad Stretch: 1 rep;60 seconds (BLE) Aerobic Elliptical: 8' @ 1.0 for activity tolerance  Standing Side Lunges: Both;Limitations Side Lunges Limitations: 1 RT Other Standing Knee Exercises: Heel and Toe walking w/min A 1 RT on carpet, tandem gait w/5 sec hold each 2 RT w/min A, "butt kicks" w/min A x1/2 RT, front kicks 1/2 RT w/min A, Forward and Retro Ramp 1 RT each w/min A and cueing for leg length Supine Bridges: Both;10 reps;Limitations Bridges Limitations: w/cueing for knee push to improve pelvic rotation. Prone  Other Prone Exercises: TKE BLE 10x5 sec holds      Physical Therapy Assessment and Plan PT Assessment and Plan Clinical Impression Statement: Treatment focused on balance activities and strength. Pt continues to require min A with all balance activities and encouragement to ambulate w/o AD. Added exercises to improve balance and pelvic rotation.  PT Plan: Continue to progress LE strength and balance.  Re-eval next visit    Goals    Problem List Patient Active Problem List  Diagnosis  . Fracture of fibula with tibia, right, closed  . Muscle weakness (generalized)  . Difficulty in walking    PT  - End of Session Equipment Utilized During Treatment: Gait belt Activity Tolerance: Patient tolerated treatment well;Patient limited by fatigue General Behavior During Session: [facility] for tasks performed Cognition: [facility] for tasks performed  GP    Kendrea Cerritos 01/12/2012, 10:21 AM

## 2012-01-13 ENCOUNTER — Ambulatory Visit (HOSPITAL_COMMUNITY): Payer: Medicaid Other | Admitting: Physical Therapy

## 2012-01-14 ENCOUNTER — Ambulatory Visit (HOSPITAL_COMMUNITY): Payer: Medicaid Other | Admitting: Physical Therapy

## 2012-01-15 ENCOUNTER — Ambulatory Visit (HOSPITAL_COMMUNITY): Payer: Medicaid Other | Admitting: Physical Therapy

## 2012-01-19 ENCOUNTER — Ambulatory Visit (HOSPITAL_COMMUNITY)
Admission: RE | Admit: 2012-01-19 | Discharge: 2012-01-19 | Disposition: A | Discharge status: Home / Self Care | Payer: Medicaid Other | Source: Ambulatory Visit | Attending: Internal Medicine | Admitting: Internal Medicine

## 2012-01-19 NOTE — Progress Notes (Signed)
Physical Therapy Re-evaluation  Patient Details  Name: [name] MRN: [id number] Date of Birth: Aug 18, 1965  Today's Date: 01/19/2012 Time: 0929-1025 PT Time Calculation (min): 56 min  Visit#: 7  of 12   Re-eval: 02/16/12  Charge: MMT 1 unit, gait 10', NMR 23, self care 10  Authorization: Medicaid/financial aide through [employer]  Authorization Time Period:    Authorization Visit#: 7  of 11    Subjective Symptoms/Limitations Symptoms: Pt reports R LE is stiff from sitting today, no pain to c/o.   Pain Assessment Currently in Pain?: No/denies  Objective:   Sensation/Coordination/Flexibility/Functional Tests Functional Tests Functional Tests: LEFS: 39/80 (was 30/80) Functional Tests: 5 STS 11.9" no HHA  Assessment RLE Strength Right Hip Flexion: 5/5 (was 4+/5) Right Hip Extension: 4/5 (was 3-/5) Right Hip ABduction: 3/5 (was 3-/5) Right Hip ADduction: 4/5 (4+/5 was 3+/5) Right Knee Flexion: 5/5 Right Knee Extension:  (4+/5 was 4+/5) Right Ankle Dorsiflexion: 4/5 (was 3+/5) Right Ankle Plantar Flexion:  (2+/5 lacking full ROMwas 2+/5)  Mobility/Balance  Static Standing Balance Single Leg Stance - Right Leg: 4  Single Leg Stance - Left Leg: 2  Tandem Stance - Right Leg: 48  Tandem Stance - Left Leg: 32  Rhomberg - Eyes Opened: 60  Rhomberg - Eyes Closed: 60    Exercise/Treatments Aerobic Elliptical: 8' @ L2 for activity tolerance   Physical Therapy Assessment and Plan PT Assessment and Plan Clinical Impression Statement: Reassessment complete, Mr Fieldhouse has had 7 OPPT sessions over 6 weeks with the following findings:  Pt has met 0/1 STGs and 1/4 LTGs.  Pt is not compliant with HEP, reported the sit to stand and ambulation exercise but does not have a copy of his HEP.  Pt explained the benefits of completeing the exercises and encouraged to increase frequency,, pt was given another copy of HEP worksheet.  Overall balance has improved with Romberg's test  with eyes open and closed with no LOB, able to hold stance for 1'+, pt able to independently form the partial tandem stance  for greater than 29", pt is still unable to form tandem stance.  SLS is still very difficult and does require assistance with activity to reduce risk of falls.  Overall R LE strength has improved.  Pt able to complete 5 STS with no HHA in 11.9" without assistance and has increased perceived functional LE abilites, increased by 9 points since initial eval.  Pt is able to ambulate indoors independently with no AD, pt does state he uses SPC outdoor settings.    Goals Home Exercise Program Pt will Perform Home Exercise Program: Independently PT Goal: Perform Home Exercise Program - Progress: Not met PT Short Term Goals Time to Complete Short Term Goals: 2 weeks PT Short Term Goal 1: Pt will improve his balance and demonstrate static tandem stance x20 sec.  PT Short Term Goal 1 - Progress: Progressing toward goal (Partial tandem stance L 32", R 48) PT Long Term Goals Time to Complete Long Term Goals: 4 weeks PT Long Term Goal 1: Pt will improve RLE strength by 1 mm grade in order to ambulate independently x30 minutes. PT Long Term Goal 1 - Progress: Progressing toward goal PT Long Term Goal 2: Pt will improve 5 STS to less than 12 sec for improved functional strength (5 STS 11.9" no HHA) PT Long Term Goal 2 - Progress: Met Long Term Goal 3: Pt will improve his LEFS to 40/80 for improved percieved functional ability  Long Term  Goal 3 Progress: Progressing toward goal (LEFS: 39/80) Long Term Goal 4: Pt will improve his RLE flexibility to [facility] in order to ambulate indpendently with apporpriate gait mechanics  Long Term Goal 4 Progress: Progressing toward goal  Problem List Patient Active Problem List  Diagnosis  . Fracture of fibula with tibia, right, closed  . Muscle weakness (generalized)  . Difficulty in walking   PT - End of Session Equipment Utilized During Treatment:  Gait belt Activity Tolerance: Patient tolerated treatment well General Behavior During Session: [facility] for tasks performed Cognition: [facility] for tasks performed  Juel Burrow 01/19/2012, 2:20 PM

## 2012-01-21 ENCOUNTER — Ambulatory Visit (HOSPITAL_COMMUNITY)
Admission: RE | Admit: 2012-01-21 | Discharge: 2012-01-21 | Disposition: A | Discharge status: Home / Self Care | Payer: Medicaid Other | Source: Ambulatory Visit | Attending: Internal Medicine | Admitting: Internal Medicine

## 2012-01-21 NOTE — Progress Notes (Signed)
Physical Therapy Treatment Patient Details  Name: [name] MRN: [id number] Date of Birth: 08/28/1965  Today's Date: 01/21/2012 Time: 1000-1044 PT Time Calculation (min): 44 min Charges: 79' TE Visit#: 8  of 12   Re-eval: 02/16/12   Authorization: Medicaid/financial aide through [place]  Authorization Time Period:    Authorization Visit#: 8  of 11    Subjective: Symptoms/Limitations Symptoms: Feeling good today, no pain just stiff Pain Assessment Currently in Pain?: No/denies  Precautions/Restrictions     Exercise/Treatments Aerobic Elliptical: 8' @ L2 for activity tolerance  Machines for Strengthening Cybex Knee Extension: 5 PL 2x15 Cybex Knee Flexion: 8 PL 2x15 Standing Other Standing Knee Exercises: 2 RT retro and tandem gait with sacral-sternal cueing for posture and VC for stride length.  5 sec holds each step Other Standing Knee Exercises: Walking: high kicks, SKTC, butt kicks 1/2 RT each - mod A for all    Physical Therapy Assessment and Plan PT Assessment and Plan Clinical Impression Statement: Pt continues to require mod-max A w/sacral-sternal cueing for posture and balance.  he is most limited in his decreased hip extension and pelvic rotation.  PT Frequency: Min 1X/week PT Duration: 4 weeks PT Plan: Cont to improve balance and promote pelvic rotation and hip extension.  Add standing hip flexion stretch    Goals    Problem List Patient Active Problem List  Diagnosis  . Fracture of fibula with tibia, right, closed  . Muscle weakness (generalized)  . Difficulty in walking    PT Plan of Care PT Patient Instructions: Encouraged pt to work out at the [facility] to improve overall strength.   Hadley Detloff, PT 01/21/2012, 10:52 AM

## 2012-01-27 ENCOUNTER — Ambulatory Visit (HOSPITAL_COMMUNITY): Payer: Medicaid Other

## 2012-01-28 ENCOUNTER — Ambulatory Visit (HOSPITAL_COMMUNITY): Payer: Medicaid Other | Admitting: *Deleted

## 2012-02-03 ENCOUNTER — Ambulatory Visit (HOSPITAL_COMMUNITY): Payer: Medicaid Other | Admitting: Physical Therapy

## 2012-02-10 ENCOUNTER — Ambulatory Visit (HOSPITAL_COMMUNITY): Payer: Medicaid Other | Admitting: Physical Therapy

## 2012-02-17 ENCOUNTER — Ambulatory Visit (HOSPITAL_COMMUNITY): Payer: Medicaid Other | Admitting: Physical Therapy

## 2012-11-09 ENCOUNTER — Other Ambulatory Visit: Payer: Self-pay | Admitting: Physician Assistant

## 2012-11-09 DIAGNOSIS — R252 Cramp and spasm: Secondary | ICD-10-CM

## 2012-11-14 ENCOUNTER — Ambulatory Visit
Admission: RE | Admit: 2012-11-14 | Discharge: 2012-11-14 | Disposition: A | Discharge status: Home / Self Care | Payer: Medicaid Other | Source: Ambulatory Visit | Attending: Physician Assistant | Admitting: Physician Assistant

## 2012-11-14 DIAGNOSIS — R252 Cramp and spasm: Secondary | ICD-10-CM

## 2015-04-18 ENCOUNTER — Emergency Department (HOSPITAL_COMMUNITY)
Admission: EM | Admit: 2015-04-18 | Discharge: 2015-04-18 | Disposition: A | Discharge status: Home / Self Care | Payer: Medicaid Other | Attending: Emergency Medicine | Admitting: Emergency Medicine

## 2015-04-18 ENCOUNTER — Encounter (HOSPITAL_COMMUNITY): Payer: Self-pay | Admitting: Emergency Medicine

## 2015-04-18 DIAGNOSIS — R339 Retention of urine, unspecified: Secondary | ICD-10-CM | POA: Insufficient documentation

## 2015-04-18 DIAGNOSIS — R1030 Lower abdominal pain, unspecified: Secondary | ICD-10-CM | POA: Insufficient documentation

## 2015-04-18 DIAGNOSIS — R3911 Hesitancy of micturition: Secondary | ICD-10-CM | POA: Insufficient documentation

## 2015-04-18 DIAGNOSIS — F17210 Nicotine dependence, cigarettes, uncomplicated: Secondary | ICD-10-CM | POA: Diagnosis not present

## 2015-04-18 DIAGNOSIS — Z79899 Other long term (current) drug therapy: Secondary | ICD-10-CM | POA: Diagnosis not present

## 2015-04-18 DIAGNOSIS — R338 Other retention of urine: Secondary | ICD-10-CM

## 2015-04-18 LAB — I-STAT CREATININE, ED: CREATININE: 1.2 mg/dL (ref 0.61–1.24)

## 2015-04-18 MED ORDER — HYDROMORPHONE HCL 1 MG/ML IJ SOLN
0.5000 mg | Freq: Once | INTRAMUSCULAR | Status: AC
  Administered 2015-04-18: 0.5 mg via INTRAVENOUS
  Filled 2015-04-18: qty 1

## 2015-04-18 MED ORDER — TAMSULOSIN HCL 0.4 MG PO CAPS: 0.4000 mg | ORAL_CAPSULE | Freq: Every day | ORAL | Status: DC

## 2015-04-18 NOTE — ED Notes (Signed)
Per EMS pt states he has not been able to urinate in 2 hours; hx enlarged prostate;  BP 124/90; P 80; RR 16;

## 2015-04-18 NOTE — ED Notes (Signed)
Coude foley attempted unsuccessful

## 2015-04-18 NOTE — ED Provider Notes (Signed)
CSN: [id number]     Arrival date & time 04/18/15  2018 History   First MD Initiated Contact with Patient 04/18/15 2101     Chief Complaint  Patient presents with  . Urinary Retention     (Consider location/radiation/quality/duration/timing/severity/associated sxs/prior Treatment) HPI   49 year old male with history of benign prostatic hypertrophy brought here via EMS for evaluation of urinary retention. Patient reports gradual onset of low abdominal pain and urinary retention for the past 2-3 hours. Described as sharp pressure pain moderate in intensity, nonradiating. Patient attempt to urinate, but unable to produce any urine. He reports occasional urinary hesitancy and weak streams but never had urinary retention requiring Foley catheter placement. Patient denies any recent medication changes or any injury. He is a smoker and a drinker. No history of cancer. He does not have a urologist. His PCP is Dr. Renaye Rakers.    History reviewed. No pertinent past medical history. Past Surgical History  Procedure Laterality Date  . Back surgery    . Joint replacement     No family history on file. Social History  Substance Use Topics  . Smoking status: Current Every Day Smoker    Types: Cigarettes  . Smokeless tobacco: None  . Alcohol Use: Yes    Review of Systems  All other systems reviewed and are negative.     Allergies  Morphine and related  Home Medications   Prior to Admission medications   Medication Sig Start Date End Date Taking? Authorizing Provider  baclofen (LIORESAL) 10 MG tablet Take 10 mg by mouth 3 (three) times daily.   Yes Historical Provider, MD  FLUoxetine (PROZAC) 20 MG tablet Take 20 mg by mouth daily.   Yes Historical Provider, MD  lisinopril-hydrochlorothiazide (PRINZIDE,ZESTORETIC) 20-12.5 MG tablet Take 1 tablet by mouth daily.   Yes Historical Provider, MD  nicotine polacrilex (NICORETTE) 4 MG lozenge Take 4 mg by mouth daily as needed for smoking  cessation.   Yes Historical Provider, MD  traZODone (DESYREL) 150 MG tablet Take 150 mg by mouth at bedtime.   Yes Historical Provider, MD   BP 132/78 mmHg  Pulse 85  Temp(Src) 97.9 F (36.6 C) (Oral)  Resp 18  SpO2 100% Physical Exam  Constitutional: He appears well-developed and well-nourished. No distress.  African-American male, appears uncomfortable in bed.  HENT:  Head: Atraumatic.  Eyes: Conjunctivae are normal.  Neck: Neck supple.  Cardiovascular: Normal rate and regular rhythm.   Pulmonary/Chest: Effort normal and breath sounds normal.  Abdominal: There is tenderness (Suprapubic tenderness with guarding but no rebound tenderness.).  Genitourinary: Prostate normal and penis normal.  Chaperone present during exam. Normal rectal tone, enlarged prostate without any obvious mass. Normal color stool.  Neurological: He is alert.  Skin: No rash noted.  Psychiatric: He has a normal mood and affect.  Nursing note and vitals reviewed.   ED Course  Procedures (including critical care time) Labs Review Labs Reviewed  I-STAT CREATININE, ED     MDM   Final diagnoses:  Acute urinary retention    BP 116/82 mmHg  Pulse 83  Temp(Src) 97.9 F (36.6 C) (Oral)  Resp 16  SpO2 100%   9:14 PM Patient with history of BPH here with urinary retention and no abdominal pain. A bladder scan reveals greater than 1000 mL of retained urine. Plan to insert Foley catheter.  9:22 PM Initial attempt of Foley insertion by her nurse without any success. Will attempt Foley insertion using a coud catheter.  9:57  PM Unsuccessful attempt placing 16 Fr coude catheter.  It appears there's some clots obstructing the urethra.  Appreciate consultation with urologist Dr. Ronne Binning who will see pt in ER and will provide further management.  Pain medication given.  Care discussed with DR. Zammit.    11:01 PM Urologist Dr. Ronne Binning was able to successfully advance a foley catheter through a guide  wire.  Clear urine return and pt felt much better.  Dr. Ronne Binning recommend flomax for the next 7 days along with wearing foley catheter.  He will see pt in office in 1 week.  Pt agrees with plan.  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    Fayrene Helper, PA-C 04/18/15 2312  Bethann Berkshire, MD 04/18/15 [phone]

## 2015-04-18 NOTE — ED Notes (Signed)
RN provided education on Foley catheter care; pt verbalized understanding

## 2015-04-18 NOTE — ED Notes (Signed)
Bed: WA13 Expected date:  Expected time:  Means of arrival:  Comments: EMS-urinary retention 

## 2015-04-18 NOTE — Discharge Instructions (Signed)
Please take flomax daily and wear foley catheter for 1 week then follow up with Dr. Ronne Binning in office for further care.  Return if you have any concerns.    Acute Urinary Retention, Male Acute urinary retention is the temporary inability to urinate. This is a common problem in older men. As men age their prostates become larger and block the flow of urine from the bladder. This is usually a problem that has come on gradually.  HOME CARE INSTRUCTIONS If you are sent home with a Foley catheter and a drainage system, you will need to discuss the best course of action with your health care provider. While the catheter is in, maintain a good intake of fluids. Keep the drainage bag emptied and lower than your catheter. This is so that contaminated urine will not flow back into your bladder, which could lead to a urinary tract infection. There are two main types of drainage bags. One is a large bag that usually is used at night. It has a good capacity that will allow you to sleep through the night without having to empty it. The second type is called a leg bag. It has a smaller capacity, so it needs to be emptied more frequently. However, the main advantage is that it can be attached by a leg strap and can go underneath your clothing, allowing you the freedom to move about or leave your home. Only take over-the-counter or prescription medicines for pain, discomfort, or fever as directed by your health care provider.  SEEK MEDICAL CARE IF:  You develop a low-grade fever.  You experience spasms or leakage of urine with the spasms. SEEK IMMEDIATE MEDICAL CARE IF:  1. You develop chills or fever. 2. Your catheter stops draining urine. 3. Your catheter falls out. 4. You start to develop increased bleeding that does not respond to rest and increased fluid intake. MAKE SURE YOU: 1. Understand these instructions. 2. Will watch your condition. 3. Will get help right away if you are not doing well or get  worse.   This information is not intended to replace advice given to you by your health care provider. Make sure you discuss any questions you have with your health care provider.   Document Released: 07/20/2000 Document Revised: 08/28/2014 Document Reviewed: 09/22/2012 Elsevier Interactive Patient Education 2016 [employer]  Foley Catheter Care, Adult A Foley catheter is a soft, flexible tube that is placed into the bladder to drain urine. A Foley catheter may be inserted if:  You leak urine or are not able to control when you urinate (urinary incontinence).  You are not able to urinate when you need to (urinary retention).  You had prostate surgery or surgery on the genitals.  You have certain medical conditions, such as multiple sclerosis, dementia, or a spinal cord injury. If you are going home with a Foley catheter in place, follow the instructions below. TAKING CARE OF THE CATHETER 5. Wash your hands with soap and water. 6. Using mild soap and warm water on a clean washcloth:  Clean the area on your body closest to the catheter insertion site using a circular motion, moving away from the catheter. Never wipe toward the catheter because this could sweep bacteria up into the urethra and cause infection.  Remove all traces of soap. Pat the area dry with a clean towel. For males, reposition the foreskin. 7. Attach the catheter to your leg so there is no tension on the catheter. Use adhesive tape or a  leg strap. If you are using adhesive tape, remove any sticky residue left behind by the previous tape you used. 8. Keep the drainage bag below the level of the bladder, but keep it off the floor. 9. Check throughout the day to be sure the catheter is working and urine is draining freely. Make sure the tubing does not become kinked. 10. Do not pull on the catheter or try to remove it. Pulling could damage internal tissues. TAKING CARE OF THE DRAINAGE BAGS You will be given two drainage  bags to take home. One is a large overnight drainage bag, and the other is a smaller leg bag that fits underneath clothing. You may wear the overnight bag at any time, but you should never wear the smaller leg bag at night. Follow the instructions below for how to empty, change, and clean your drainage bags. Emptying the Drainage Bag You must empty your drainage bag when it is  - full or at least 2-3 times a day. 4. Wash your hands with soap and water. 5. Keep the drainage bag below your hips, below the level of your bladder. This stops urine from going back into the tubing and into your bladder. 6. Hold the dirty bag over the toilet or a clean container. 7. Open the pour spout at the bottom of the bag and empty the urine into the toilet or container. Do not let the pour spout touch the toilet, container, or any other surface. Doing so can place bacteria on the bag, which can cause an infection. 8. Clean the pour spout with a gauze pad or cotton ball that has rubbing alcohol on it. 9. Close the pour spout. 10. Attach the bag to your leg with adhesive tape or a leg strap. 11. Wash your hands well. Changing the Drainage Bag Change your drainage bag once a month or sooner if it starts to smell bad or look dirty. Below are steps to follow when changing the drainage bag. 1. Wash your hands with soap and water. 2. Pinch off the rubber catheter so that urine does not spill out. 3. Disconnect the catheter tube from the drainage tube at the connection valve. Do not let the tubes touch any surface. 4. Clean the end of the catheter tube with an alcohol wipe. Use a different alcohol wipe to clean the end of the drainage tube. 5. Connect the catheter tube to the drainage tube of the clean drainage bag. 6. Attach the new bag to the leg with adhesive tape or a leg strap. Avoid attaching the new bag too tightly. 7. Wash your hands well. Cleaning the Drainage Bag 1. Wash your hands with soap and water. 2. Wash  the bag in warm, soapy water. 3. Rinse the bag thoroughly with warm water. 4. Fill the bag with a solution of white vinegar and water (1 cup vinegar to 1 qt warm water [.2 L vinegar to 1 L warm water]). Close the bag and soak it for 30 minutes in the solution. 5. Rinse the bag with warm water. 6. Hang the bag to dry with the pour spout open and hanging downward. 7. Store the clean bag (once it is dry) in a clean plastic bag. 8. Wash your hands well. PREVENTING INFECTION  Wash your hands before and after handling your catheter.  Take showers daily and wash the area where the catheter enters your body. Do not take baths. Replace wet leg straps with dry ones, if this applies.  Do not  use powders, sprays, or lotions on the genital area. Only use creams, lotions, or ointments as directed by your caregiver.  For females, wipe from front to back after each bowel movement.  Drink enough fluids to keep your urine clear or pale yellow unless you have a fluid restriction.  Do not let the drainage bag or tubing touch or lie on the floor.  Wear cotton underwear to absorb moisture and to keep your skin drier. SEEK MEDICAL CARE IF:   Your urine is cloudy or smells unusually bad.  Your catheter becomes clogged.  You are not draining urine into the bag or your bladder feels full.  Your catheter starts to leak. SEEK IMMEDIATE MEDICAL CARE IF:   You have pain, swelling, redness, or pus where the catheter enters the body.  You have pain in the abdomen, legs, lower back, or bladder.  You have a fever.  You see blood fill the catheter, or your urine is pink or red.  You have nausea, vomiting, or chills.  Your catheter gets pulled out. MAKE SURE YOU:   Understand these instructions.  Will watch your condition.  Will get help right away if you are not doing well or get worse.   This information is not intended to replace advice given to you by your health care provider. Make sure you  discuss any questions you have with your health care provider.   Document Released: 04/13/2005 Document Revised: 08/28/2013 Document Reviewed: 04/04/2012 Elsevier Interactive Patient Education [employer]

## 2015-04-18 NOTE — Consult Note (Signed)
Urology Consult  Referring physician: Dr. Estell Harpin Reason for referral: urinary retention, difficult foley  Chief Complaint: suprapubic pain  History of Present Illness: [name] is a 49yo who presented to the ER tonight with a 12 hour hx of an inability to urinate. He has been having worsening LUTS over the past year. He has nocturia 3x, frequency q 1-2 hours, urgency, hesitancy, weak stream. He is not on any BPH meds. He denies hematuria, dysuria. No hx of UTIs, prostate infections. No issues with erections  History reviewed. No pertinent past medical history. Past Surgical History  Procedure Laterality Date  . Back surgery    . Joint replacement      Medications: I have reviewed the patient's current medications. Allergies:  Allergies  Allergen Reactions  . Morphine And Related Hives    No family history on file. Social History:  reports that he has been smoking Cigarettes.  He does not have any smokeless tobacco history on file. He reports that he drinks alcohol. His drug history is not on file.  Review of Systems  Gastrointestinal: Positive for nausea.  Genitourinary: Positive for urgency and frequency.  All other systems reviewed and are negative.   Physical Exam:  Vital signs in last 24 hours: Temp:  [97.9 F (36.6 C)] 97.9 F (36.6 C) (12/22 2030) Pulse Rate:  [85] 85 (12/22 2030) Resp:  [18] 18 (12/22 2030) BP: (132)/(78) 132/78 mmHg (12/22 2030) SpO2:  [100 %] 100 % (12/22 2030) Physical Exam  Constitutional: He is oriented to person, place, and time. He appears well-developed and well-nourished.  HENT:  Head: Normocephalic and atraumatic.  Eyes: EOM are normal. Pupils are equal, round, and reactive to light.  Neck: Normal range of motion. No thyromegaly present.  Cardiovascular: Normal rate and regular rhythm.   Respiratory: Effort normal. No respiratory distress.  GI: Soft. He exhibits no distension. Hernia confirmed negative in the right inguinal area and  confirmed negative in the left inguinal area.  Genitourinary: Rectum normal and penis normal. Prostate is enlarged. Prostate is not tender. Right testis shows no mass and no tenderness. Left testis shows no mass and no tenderness. Circumcised. No penile erythema or penile tenderness. No discharge found.  Musculoskeletal: Normal range of motion.  Neurological: He is alert and oriented to person, place, and time.  Skin: Skin is warm and dry.  Psychiatric: He has a normal mood and affect. His behavior is normal. Judgment and thought content normal.    Laboratory Data:  Results for orders placed or performed during the hospital encounter of 04/18/15 (from the past 72 hour(s))  I-Stat Creatinine, ED (do not order at [facility])     Status: None   Collection Time: 04/18/15  9:50 PM  Result Value Ref Range   Creatinine, Ser 1.20 0.61 - 1.24 mg/dL   No results found for this or any previous visit (from the past 240 hour(s)). Creatinine:  Recent Labs  04/18/15 2150  CREATININE 1.20   Baseline Creatinine: unknown  Foley Catheter Placemenet--Complex  Patient was prepped and draped in the usual sterile fashion using betadine solution. 2% viscous lidocaine was inserted per urethra. A 0.038 sensor was advanced per urethra without significant resistance or recoiling. Upon passage through the bladder neck with the wire urine was noted to emanate from the urethral meatus. A 18 Fr council catheter was placed via the Blitz technique and passed easily with immediate return of >1000 cc of clear, amber colored urine without clot. 10 cc sterile water was  placed in the balloon port.    Impression/Assessment:  49yo with BPH w LUTS, urinary retention, difficult foley placement  Plan:  1. 18 french foley placed and 1000cc of clear urine drained promptly. 2. Flomax 0.4mg  daily 3. Followup Urology 1 week for voiding trial  [name] L 04/18/2015, 10:57 PM

## 2015-04-18 NOTE — ED Notes (Signed)
Writer was unable to insert a foley, notified PA and RN.

## 2015-07-14 ENCOUNTER — Encounter (HOSPITAL_COMMUNITY): Payer: Self-pay | Admitting: Oncology

## 2015-07-14 ENCOUNTER — Emergency Department (HOSPITAL_COMMUNITY)
Admission: EM | Admit: 2015-07-14 | Discharge: 2015-07-14 | Disposition: A | Discharge status: Home / Self Care | Payer: Medicaid Other | Attending: Emergency Medicine | Admitting: Emergency Medicine

## 2015-07-14 DIAGNOSIS — F17210 Nicotine dependence, cigarettes, uncomplicated: Secondary | ICD-10-CM | POA: Diagnosis not present

## 2015-07-14 DIAGNOSIS — R1030 Lower abdominal pain, unspecified: Secondary | ICD-10-CM | POA: Insufficient documentation

## 2015-07-14 DIAGNOSIS — N400 Enlarged prostate without lower urinary tract symptoms: Secondary | ICD-10-CM | POA: Diagnosis not present

## 2015-07-14 DIAGNOSIS — R140 Abdominal distension (gaseous): Secondary | ICD-10-CM | POA: Diagnosis not present

## 2015-07-14 DIAGNOSIS — Z79899 Other long term (current) drug therapy: Secondary | ICD-10-CM | POA: Diagnosis not present

## 2015-07-14 DIAGNOSIS — R339 Retention of urine, unspecified: Secondary | ICD-10-CM | POA: Insufficient documentation

## 2015-07-14 HISTORY — DX: Benign prostatic hyperplasia without lower urinary tract symptoms: N40.0

## 2015-07-14 LAB — URINE MICROSCOPIC-ADD ON
Bacteria, UA: NONE SEEN
WBC UA: NONE SEEN WBC/hpf (ref 0–5)

## 2015-07-14 LAB — URINALYSIS, ROUTINE W REFLEX MICROSCOPIC
Bilirubin Urine: NEGATIVE
Glucose, UA: NEGATIVE mg/dL
KETONES UR: NEGATIVE mg/dL
LEUKOCYTES UA: NEGATIVE
NITRITE: NEGATIVE
PROTEIN: NEGATIVE mg/dL
Specific Gravity, Urine: 1.004 — ABNORMAL LOW (ref 1.005–1.030)
pH: 5.5 (ref 5.0–8.0)

## 2015-07-14 MED ORDER — TAMSULOSIN HCL 0.4 MG PO CAPS: 0.4000 mg | ORAL_CAPSULE | Freq: Every day | ORAL | Status: DC

## 2015-07-14 NOTE — ED Notes (Signed)
Per EMS pt has been unable to void since yesterday morning.  Pt is rating pain 8/10.

## 2015-07-14 NOTE — ED Notes (Signed)
Per bladder scanner pt has >999 ml's in bladder.  Attempted I&O x 1 w/o success.  Pt told this writer that the last time he had this problem they had to put "wires" in d/t enlarged prostate.

## 2015-07-14 NOTE — ED Provider Notes (Signed)
CSN: [id number]     Arrival date & time 07/14/15  0031 History  By signing my name below, I, [name], attest that this documentation has been prepared under the direction and in the presence of Geoffery Lyons, MD. Electronically Signed: Bethel Olsen, ED Scribe. 07/14/2015. 1:13 AM   Chief Complaint  Patient presents with  . Urinary Retention   The history is provided by the patient. No language interpreter was used.   [name] is a 50 y.o. male who presents to the Emergency Department complaining of recurrent urinary retention with onset yesterday. Pt states that he has been unable to urinate since yesterday morning. He last had similar symptoms 1 week ago that resolved over time.  In the past he has been told that he had an enlarged prostate.  Associated symptoms include constant 8/10 in severity lower abdominal pain. Pt denies fever, nausea, and vomiting.   Past Medical History  Diagnosis Date  . BPH (benign prostatic hyperplasia)    Past Surgical History  Procedure Laterality Date  . Back surgery    . Joint replacement     No family history on file. Social History  Substance Use Topics  . Smoking status: Current Every Day Smoker    Types: Cigarettes  . Smokeless tobacco: None  . Alcohol Use: Yes    Review of Systems  10 Systems reviewed and all are negative for acute change except as noted in the HPI.  Allergies  Morphine and related  Home Medications   Prior to Admission medications   Medication Sig Start Date End Date Taking? Authorizing Provider  baclofen (LIORESAL) 10 MG tablet Take 10 mg by mouth 3 (three) times daily.   Yes Historical Provider, MD  FLUoxetine (PROZAC) 20 MG tablet Take 20 mg by mouth daily.   Yes Historical Provider, MD  lisinopril-hydrochlorothiazide (PRINZIDE,ZESTORETIC) 20-12.5 MG tablet Take 1 tablet by mouth daily.   Yes Historical Provider, MD  montelukast (SINGULAIR) 10 MG tablet Take 10 mg by mouth at bedtime.   Yes  Historical Provider, MD  tamsulosin (FLOMAX) 0.4 MG CAPS capsule Take 1 capsule (0.4 mg total) by mouth daily. 04/18/15  Yes Fayrene Helper, PA-C  traZODone (DESYREL) 150 MG tablet Take 75 mg by mouth at bedtime.    Yes Historical Provider, MD   BP 146/103 mmHg  Pulse 60  Temp(Src) 97.5 F (36.4 C) (Oral)  Resp 22  SpO2 99% Physical Exam  Constitutional: He is oriented to person, place, and time. He appears well-developed and well-nourished.  HENT:  Head: Normocephalic and atraumatic.  Eyes: EOM are normal.  Neck: Normal range of motion.  Cardiovascular: Normal rate, regular rhythm, normal heart sounds and intact distal pulses.   Pulmonary/Chest: Effort normal and breath sounds normal. No respiratory distress.  Abdominal: Soft. He exhibits distension. There is tenderness.  Suprapubic tenderness present and distension   Musculoskeletal: Normal range of motion.  Neurological: He is alert and oriented to person, place, and time.  Skin: Skin is warm and dry.  Psychiatric: He has a normal mood and affect. Judgment normal.  Nursing note and vitals reviewed.   ED Course  Procedures (including critical care time) DIAGNOSTIC STUDIES: Oxygen Saturation is 99% on RA,  normal by my interpretation.    COORDINATION OF CARE: 12:49 AM Discussed treatment plan which includes urinary catheter placement and UA with pt at bedside and pt agreed to plan.  Labs Review Labs Reviewed  URINALYSIS, ROUTINE W REFLEX MICROSCOPIC (NOT AT [facility])  Imaging Review No results found. I personally reviewed and evaluated these lab results as a part of my medical decision-making.    EKG Interpretation None      MDM   Final diagnoses:  None    Foley catheter placed with nearly 1 L of return. Symptoms have resolved. Patient advised to follow-up with urology, however he states that he does not have the financial means to do so. He is requesting to be discharged after the Foley is removed. He has been on  Flomax in the past, but cannot afford this medication either. He will be discharged with a prescription for this and referral will be placed to social work to see if they can give him some assistance with affording this medication. His urine is clear with no evidence for infection.  I personally performed the services described in this documentation, which was scribed in my presence. The recorded information has been reviewed and is accurate.       Geoffery Lyons, MD 07/14/15 [phone]

## 2015-07-14 NOTE — ED Notes (Signed)
Bed: WA01 Expected date:  Expected time:  Means of arrival:  Comments: EMS 

## 2015-07-14 NOTE — Discharge Instructions (Signed)
Flomax as prescribed.  Follow-up with [facility]. The contact information has been provided in this discharge summary for you to call and arrange this appointment.   Acute Urinary Retention, Male Acute urinary retention is the temporary inability to urinate. This is a common problem in older men. As men age their prostates become larger and block the flow of urine from the bladder. This is usually a problem that has come on gradually.  HOME CARE INSTRUCTIONS If you are sent home with a Foley catheter and a drainage system, you will need to discuss the best course of action with your health care provider. While the catheter is in, maintain a good intake of fluids. Keep the drainage bag emptied and lower than your catheter. This is so that contaminated urine will not flow back into your bladder, which could lead to a urinary tract infection. There are two main types of drainage bags. One is a large bag that usually is used at night. It has a good capacity that will allow you to sleep through the night without having to empty it. The second type is called a leg bag. It has a smaller capacity, so it needs to be emptied more frequently. However, the main advantage is that it can be attached by a leg strap and can go underneath your clothing, allowing you the freedom to move about or leave your home. Only take over-the-counter or prescription medicines for pain, discomfort, or fever as directed by your health care provider.  SEEK MEDICAL CARE IF:  You develop a low-grade fever.  You experience spasms or leakage of urine with the spasms. SEEK IMMEDIATE MEDICAL CARE IF:   You develop chills or fever.  Your catheter stops draining urine.  Your catheter falls out.  You start to develop increased bleeding that does not respond to rest and increased fluid intake. MAKE SURE YOU:  Understand these instructions.  Will watch your condition.  Will get help right away if you are not doing well or  get worse.   This information is not intended to replace advice given to you by your health care provider. Make sure you discuss any questions you have with your health care provider.   Document Released: 07/20/2000 Document Revised: 08/28/2014 Document Reviewed: 09/22/2012 Elsevier Interactive Patient Education [employer]

## 2015-07-14 NOTE — ED Notes (Signed)
MD at bedside. 

## 2015-07-15 ENCOUNTER — Telehealth: Payer: Self-pay | Admitting: *Deleted

## 2015-07-16 ENCOUNTER — Telehealth: Payer: Self-pay | Admitting: *Deleted

## 2015-08-22 ENCOUNTER — Emergency Department (HOSPITAL_COMMUNITY)
Admission: EM | Admit: 2015-08-22 | Discharge: 2015-08-22 | Disposition: A | Discharge status: Home / Self Care | Payer: Medicaid Other | Attending: Emergency Medicine | Admitting: Emergency Medicine

## 2015-08-22 ENCOUNTER — Encounter (HOSPITAL_COMMUNITY): Payer: Self-pay | Admitting: Emergency Medicine

## 2015-08-22 DIAGNOSIS — F17210 Nicotine dependence, cigarettes, uncomplicated: Secondary | ICD-10-CM | POA: Diagnosis not present

## 2015-08-22 DIAGNOSIS — Z79899 Other long term (current) drug therapy: Secondary | ICD-10-CM | POA: Insufficient documentation

## 2015-08-22 DIAGNOSIS — R109 Unspecified abdominal pain: Secondary | ICD-10-CM | POA: Diagnosis not present

## 2015-08-22 DIAGNOSIS — N401 Enlarged prostate with lower urinary tract symptoms: Secondary | ICD-10-CM | POA: Insufficient documentation

## 2015-08-22 DIAGNOSIS — R339 Retention of urine, unspecified: Secondary | ICD-10-CM | POA: Insufficient documentation

## 2015-08-22 DIAGNOSIS — R3915 Urgency of urination: Secondary | ICD-10-CM | POA: Insufficient documentation

## 2015-08-22 DIAGNOSIS — R300 Dysuria: Secondary | ICD-10-CM | POA: Diagnosis present

## 2015-08-22 DIAGNOSIS — R39198 Other difficulties with micturition: Secondary | ICD-10-CM | POA: Diagnosis not present

## 2015-08-22 DIAGNOSIS — R338 Other retention of urine: Secondary | ICD-10-CM

## 2015-08-22 LAB — CBC WITH DIFFERENTIAL/PLATELET
BASOS ABS: 0 10*3/uL (ref 0.0–0.1)
Basophils Relative: 1 %
Eosinophils Absolute: 0.1 10*3/uL (ref 0.0–0.7)
Eosinophils Relative: 1 %
HEMATOCRIT: 37.8 % — AB (ref 39.0–52.0)
Hemoglobin: 13 g/dL (ref 13.0–17.0)
LYMPHS ABS: 2.1 10*3/uL (ref 0.7–4.0)
LYMPHS PCT: 40 %
MCH: 31.6 pg (ref 26.0–34.0)
MCHC: 34.4 g/dL (ref 30.0–36.0)
MCV: 91.7 fL (ref 78.0–100.0)
MONO ABS: 0.4 10*3/uL (ref 0.1–1.0)
MONOS PCT: 8 %
NEUTROS ABS: 2.6 10*3/uL (ref 1.7–7.7)
Neutrophils Relative %: 50 %
Platelets: 213 10*3/uL (ref 150–400)
RBC: 4.12 MIL/uL — ABNORMAL LOW (ref 4.22–5.81)
RDW: 13.3 % (ref 11.5–15.5)
WBC: 5.3 10*3/uL (ref 4.0–10.5)

## 2015-08-22 LAB — URINALYSIS, ROUTINE W REFLEX MICROSCOPIC
Bilirubin Urine: NEGATIVE
GLUCOSE, UA: NEGATIVE mg/dL
KETONES UR: NEGATIVE mg/dL
Nitrite: NEGATIVE
PH: 6 (ref 5.0–8.0)
Protein, ur: NEGATIVE mg/dL
Specific Gravity, Urine: 1.004 — ABNORMAL LOW (ref 1.005–1.030)

## 2015-08-22 LAB — BASIC METABOLIC PANEL
ANION GAP: 12 (ref 5–15)
BUN: 16 mg/dL (ref 6–20)
CHLORIDE: 108 mmol/L (ref 101–111)
CO2: 23 mmol/L (ref 22–32)
Calcium: 9.6 mg/dL (ref 8.9–10.3)
Creatinine, Ser: 1.3 mg/dL — ABNORMAL HIGH (ref 0.61–1.24)
GFR calc Af Amer: >60 mL/min (ref >60)
GLUCOSE: 85 mg/dL (ref 65–99)
POTASSIUM: 3.8 mmol/L (ref 3.5–5.1)
Sodium: 143 mmol/L (ref 135–145)

## 2015-08-22 LAB — URINE MICROSCOPIC-ADD ON

## 2015-08-22 MED ORDER — LIDOCAINE HCL 2 % EX GEL
1.0000 "application " | Freq: Once | CUTANEOUS | Status: AC
  Administered 2015-08-22: 1 via URETHRAL
  Filled 2015-08-22: qty 11

## 2015-08-22 NOTE — ED Notes (Signed)
Foley catheter placement attempted x3 without success. MD at bedside on 3rd attempt and is to consult Urology for difficulty with catheter placement.

## 2015-08-22 NOTE — ED Provider Notes (Signed)
CSN: [id number]     Arrival date & time 08/22/15  1921 History   First MD Initiated Contact with Patient 08/22/15 1938     Chief Complaint  Patient presents with  . Dysuria  PT HAS A HX OF BPH AND URINARY RETENTION.  HE WAS HERE LAST IN MARCH FOR THE SAME.  THE PT SAID HE SEES [facility].  IN THE PAST, HE HAD TROUBLE GETTING HIS FLOMAX DUE TO COST, BUT HE SAID THAT HE HAS BEEN ABLE TO GET IT AND HAS BEEN TAKING IT.  THE PT WAS LAST ABLE TO URINATE AROUND 1600 AND FEELS LIKE HIS BLADDER IS FULL.   (Consider location/radiation/quality/duration/timing/severity/associated sxs/prior Treatment) Patient is a 50 y.o. male presenting with dysuria. The history is provided by the patient and the EMS personnel.  Dysuria This is a recurrent problem. The current episode started 3 to 5 hours ago. Associated symptoms include abdominal pain. Nothing aggravates the symptoms. Nothing relieves the symptoms.    Past Medical History  Diagnosis Date  . BPH (benign prostatic hyperplasia)    Past Surgical History  Procedure Laterality Date  . Back surgery    . Joint replacement     History reviewed. No pertinent family history. Social History  Substance Use Topics  . Smoking status: Current Every Day Smoker -- 0.50 packs/day    Types: Cigarettes  . Smokeless tobacco: None  . Alcohol Use: Yes     Comment: drinks beer occasionally    Review of Systems  Gastrointestinal: Positive for abdominal pain.  Genitourinary: Positive for dysuria, urgency and difficulty urinating.  All other systems reviewed and are negative.     Allergies  Morphine and related  Home Medications   Prior to Admission medications   Medication Sig Start Date End Date Taking? Authorizing Provider  baclofen (LIORESAL) 10 MG tablet Take 10 mg by mouth 3 (three) times daily.   Yes Historical Provider, MD  FLUoxetine (PROZAC) 20 MG tablet Take 20 mg by mouth daily.   Yes Historical Provider, MD    lisinopril-hydrochlorothiazide (PRINZIDE,ZESTORETIC) 20-12.5 MG tablet Take 1 tablet by mouth daily.   Yes Historical Provider, MD  montelukast (SINGULAIR) 10 MG tablet Take 10 mg by mouth at bedtime.   Yes Historical Provider, MD  tamsulosin (FLOMAX) 0.4 MG CAPS capsule Take 1 capsule (0.4 mg total) by mouth daily. 07/14/15  Yes Geoffery Lyons, MD  traZODone (DESYREL) 150 MG tablet Take 75 mg by mouth at bedtime.    Yes Historical Provider, MD   BP 144/99 mmHg  Pulse 72  Temp(Src) 98 F (36.7 C) (Oral)  Resp 22  Ht  (1.88 m)  Wt 179 lb (81.194 kg)  BMI 22.97 kg/m2  SpO2 98% Physical Exam  Constitutional: He is oriented to person, place, and time. He appears well-developed and well-nourished.  HENT:  Head: Normocephalic and atraumatic.  Right Ear: External ear normal.  Left Ear: External ear normal.  Mouth/Throat: Oropharynx is clear and moist.  Eyes: Conjunctivae and EOM are normal. Pupils are equal, round, and reactive to light.  Neck: Normal range of motion. Neck supple.  Cardiovascular: Normal rate, regular rhythm, normal heart sounds and intact distal pulses.   Pulmonary/Chest: Effort normal and breath sounds normal.  Abdominal: Soft. There is tenderness.    Musculoskeletal: Normal range of motion.  Neurological: He is alert and oriented to person, place, and time.  Skin: Skin is warm and dry.  Psychiatric: He has a normal mood and affect. His behavior is normal.  Thought content normal.  Nursing note and vitals reviewed.   ED Course  Procedures (including critical care time) Labs Review Labs Reviewed  BASIC METABOLIC PANEL - Abnormal; Notable for the following:    Creatinine, Ser 1.30 (*)    All other components within normal limits  CBC WITH DIFFERENTIAL/PLATELET - Abnormal; Notable for the following:    RBC 4.12 (*)    HCT 37.8 (*)    All other components within normal limits  URINALYSIS, ROUTINE W REFLEX MICROSCOPIC (NOT AT [facility]) - Abnormal; Notable for the  following:    APPearance CLOUDY (*)    Specific Gravity, Urine 1.004 (*)    Hgb urine dipstick LARGE (*)    Leukocytes, UA SMALL (*)    All other components within normal limits  URINE MICROSCOPIC-ADD ON - Abnormal; Notable for the following:    Squamous Epithelial / LPF 0-5 (*)    Bacteria, UA RARE (*)    All other components within normal limits    Imaging Review No results found. I have personally reviewed and evaluated these images and lab results as part of my medical decision-making.   EKG Interpretation None      MDM  NURSES TRIED 3 TIMES TO PASS A FOLEY W/O SUCCESS.  I THEN PLACED VISCOUS LIDOCAINE INTO URETHRA AND THE NURSE WAS ABLE TO PASS A 16 F COUDE INTO PT'S BLADDER.  1000 CC OF URINE DRAINED FROM BLADDER.  PT FEELS MUCH BETTER.  PT WILL BE D/C'D HOME WITH HIS CATH AND INSTR TO F/U WITH UROLOGY. Final diagnoses:  Acute urinary retention        Jacalyn Lefevre, MD 08/22/15 2051

## 2015-08-22 NOTE — ED Notes (Signed)
[facility] [facility] notified of need for pt transport back to residence.

## 2015-08-22 NOTE — ED Notes (Signed)
Bed: OV56 Expected date:  Expected time:  Means of arrival:  Comments: EMS-difficulty urinating

## 2015-08-22 NOTE — ED Notes (Signed)
[facility] here for transport. 

## 2015-08-22 NOTE — ED Notes (Signed)
Pt reports not being able to urinate since 4pm today. Pt reports having history of difficulty urinating over a month ago.

## 2015-08-22 NOTE — Progress Notes (Signed)
[facility] consulted for medication assistance.  EDCM unable to assist with medication cost as this patient has Medicaid insurance.  With Medicaid, patient's medications will cost three dollars or less.

## 2015-08-22 NOTE — Discharge Instructions (Signed)
Acute Urinary Retention, Male  Acute urinary retention is when you are unable to pee (urinate). Acute urinary retention is common in older men. Prostates can get bigger, which blocks the flow of pee.   HOME CARE  · Drink enough fluids to keep your pee clear or pale yellow.  · If you are sent home with a tube that drains the bladder (catheter), there will be a drainage bag attached to it. There are two types of bags. One is big that you can wear at night without having to empty it. One is smaller and needs to be emptied more often.    Keep the drainage bag empty.    Keep the drainage bag lower than your catheter.  · Only take medicine as told by your doctor.  GET HELP IF:  · You have a low-grade fever.  · You have spasms or you are leaking pee when you have spasms.  GET HELP RIGHT AWAY IF:   · You have chills or a fever.  · Your catheter stops draining pee.  · Your catheter falls out.  · You have increased bleeding that does not stop after you have rested and increased the amount of fluids you had been drinking.  MAKE SURE YOU:   · Understand these instructions.  · Will watch your condition.  · Will get help right away if you are not doing well or get worse.     This information is not intended to replace advice given to you by your health care provider. Make sure you discuss any questions you have with your health care provider.     Document Released: 09/30/2007 Document Revised: 08/28/2014 Document Reviewed: 09/22/2012  Elsevier Interactive Patient Education ©2016 Elsevier Inc.

## 2015-08-23 ENCOUNTER — Encounter (HOSPITAL_COMMUNITY): Payer: Self-pay | Admitting: *Deleted

## 2015-08-23 ENCOUNTER — Emergency Department (HOSPITAL_COMMUNITY)
Admission: EM | Admit: 2015-08-23 | Discharge: 2015-08-23 | Disposition: A | Discharge status: Home / Self Care | Payer: Medicaid Other | Attending: Emergency Medicine | Admitting: Emergency Medicine

## 2015-08-23 DIAGNOSIS — T83031A Leakage of indwelling urethral catheter, initial encounter: Secondary | ICD-10-CM | POA: Insufficient documentation

## 2015-08-23 DIAGNOSIS — Z79899 Other long term (current) drug therapy: Secondary | ICD-10-CM | POA: Diagnosis not present

## 2015-08-23 DIAGNOSIS — Y828 Other medical devices associated with adverse incidents: Secondary | ICD-10-CM | POA: Insufficient documentation

## 2015-08-23 DIAGNOSIS — N401 Enlarged prostate with lower urinary tract symptoms: Secondary | ICD-10-CM | POA: Insufficient documentation

## 2015-08-23 DIAGNOSIS — F17210 Nicotine dependence, cigarettes, uncomplicated: Secondary | ICD-10-CM | POA: Diagnosis not present

## 2015-08-23 DIAGNOSIS — T839XXA Unspecified complication of genitourinary prosthetic device, implant and graft, initial encounter: Secondary | ICD-10-CM

## 2015-08-23 NOTE — ED Provider Notes (Signed)
CSN: [id number]     Arrival date & time 08/23/15  1021 History   First MD Initiated Contact with Patient 08/23/15 1043     Chief Complaint  Patient presents with  . cath leaking      (Consider location/radiation/quality/duration/timing/severity/associated sxs/prior Treatment) HPI Comments: 50 yo M w/ catheter placed last night for BPH and retention. Had some drainage around catheter last night so came in here for eval. No pain. No fever. No other symptoms.    Past Medical History  Diagnosis Date  . BPH (benign prostatic hyperplasia)    Past Surgical History  Procedure Laterality Date  . Back surgery    . Joint replacement     No family history on file. Social History  Substance Use Topics  . Smoking status: Current Every Day Smoker -- 0.50 packs/day    Types: Cigarettes  . Smokeless tobacco: None  . Alcohol Use: Yes     Comment: drinks beer occasionally    Review of Systems  All other systems reviewed and are negative.     Allergies  Morphine and related  Home Medications   Prior to Admission medications   Medication Sig Start Date End Date Taking? Authorizing Provider  baclofen (LIORESAL) 10 MG tablet Take 10 mg by mouth 3 (three) times daily.   Yes Historical Provider, MD  FLUoxetine (PROZAC) 20 MG tablet Take 20 mg by mouth daily.   Yes Historical Provider, MD  lisinopril-hydrochlorothiazide (PRINZIDE,ZESTORETIC) 20-12.5 MG tablet Take 1 tablet by mouth daily.   Yes Historical Provider, MD  montelukast (SINGULAIR) 10 MG tablet Take 10 mg by mouth at bedtime.   Yes Historical Provider, MD  tamsulosin (FLOMAX) 0.4 MG CAPS capsule Take 1 capsule (0.4 mg total) by mouth daily. 07/14/15  Yes Geoffery Lyons, MD  traZODone (DESYREL) 150 MG tablet Take 75 mg by mouth at bedtime.     Historical Provider, MD   BP 114/76 mmHg  Pulse 62  Temp(Src) 98.1 F (36.7 C) (Oral)  Resp 17  SpO2 100% Physical Exam  Constitutional: He is oriented to person, place, and time. He  appears well-developed and well-nourished.  HENT:  Head: Normocephalic and atraumatic.  Neck: Normal range of motion.  Cardiovascular: Normal rate.   Pulmonary/Chest: Effort normal. No respiratory distress.  Abdominal: Soft. He exhibits no distension. There is no tenderness.  Musculoskeletal: Normal range of motion.  Neurological: He is alert and oriented to person, place, and time. No cranial nerve deficit. Coordination normal.  Skin: Skin is warm and dry.  Nursing note and vitals reviewed.   ED Course  Procedures (including critical care time) Labs Review Labs Reviewed - No data to display  Imaging Review No results found. I have personally reviewed and evaluated these images and lab results as part of my medical decision-making.   EKG Interpretation None      MDM   Final diagnoses:  Foley catheter problem, initial encounter (HCC)   Foley leaking, deflated balloon, repositioned and observed for > 1 hour without recurrent leaking. Has urology follow up on Tuesday for consideration of TURP.   New Prescriptions: New Prescriptions   No medications on file    I have personally and contemperaneously reviewed labs and imaging and used in my decision making as above.   A medical screening exam was performed and I feel the patient has had an appropriate workup for their chief complaint at this time and likelihood of emergent condition existing is low and thus workup can continue on an  outpatient basis.. Their vital signs are stable. They have been counseled on decision, discharge, follow up and which symptoms necessitate immediate return to the emergency department.  They verbally stated understanding and agreement with plan and discharged in stable condition.      Marily Memos, MD 08/23/15 1257

## 2015-08-23 NOTE — ED Notes (Signed)
Patient states he had a urinary catheter placed last night and this morning felt as though the catheter wasn't draining.  On exam, patient has 600 ml of urine in foley bag.  Patient's abdomen is soft and non-tender to palpation.

## 2015-08-23 NOTE — ED Notes (Signed)
Seen here last night due to unable to urinate, cath placed, now leaking around cath.

## 2015-10-14 ENCOUNTER — Other Ambulatory Visit: Payer: Self-pay | Admitting: Urology

## 2015-10-18 ENCOUNTER — Encounter (HOSPITAL_BASED_OUTPATIENT_CLINIC_OR_DEPARTMENT_OTHER): Payer: Self-pay | Admitting: *Deleted

## 2015-10-21 ENCOUNTER — Encounter (HOSPITAL_BASED_OUTPATIENT_CLINIC_OR_DEPARTMENT_OTHER): Payer: Self-pay | Admitting: *Deleted

## 2015-10-21 NOTE — Progress Notes (Signed)
NPO AFTER MN WITH EXCEPTION CLEAR LIQUIDS UNTIL 0730 (NO CREAM /MILK PRODUCTS).  ARRIVE AT 1215.  NEEDS ISTAT AND EKG.  WILL TAKE AM MEDS DOS W/ SIPS OF WATER EXCEPT NO LISINOPRIL/ HCT.  PT AWARE OWER AT MAIN.

## 2015-10-25 ENCOUNTER — Encounter (HOSPITAL_BASED_OUTPATIENT_CLINIC_OR_DEPARTMENT_OTHER): Payer: Self-pay | Admitting: *Deleted

## 2015-10-25 MED ORDER — LACTATED RINGERS IV SOLN
INTRAVENOUS | Status: DC
  Administered 2015-10-28 (×2): via INTRAVENOUS
  Filled 2015-10-25: qty 1000

## 2015-10-28 ENCOUNTER — Ambulatory Visit (HOSPITAL_COMMUNITY): Payer: Medicaid Other | Admitting: Certified Registered Nurse Anesthetist

## 2015-10-28 ENCOUNTER — Observation Stay (HOSPITAL_COMMUNITY)
Admission: RE | Admit: 2015-10-28 | Discharge: 2015-10-29 | Disposition: A | Discharge status: Home / Self Care | Payer: Medicaid Other | Source: Ambulatory Visit | Attending: Urology | Admitting: Urology

## 2015-10-28 ENCOUNTER — Encounter (HOSPITAL_COMMUNITY)
Admission: RE | Disposition: A | Discharge status: Home / Self Care | Payer: Self-pay | Source: Ambulatory Visit | Attending: Urology

## 2015-10-28 ENCOUNTER — Encounter (HOSPITAL_COMMUNITY): Payer: Self-pay | Admitting: *Deleted

## 2015-10-28 DIAGNOSIS — K449 Diaphragmatic hernia without obstruction or gangrene: Secondary | ICD-10-CM | POA: Insufficient documentation

## 2015-10-28 DIAGNOSIS — R3915 Urgency of urination: Secondary | ICD-10-CM | POA: Diagnosis not present

## 2015-10-28 DIAGNOSIS — R262 Difficulty in walking, not elsewhere classified: Secondary | ICD-10-CM | POA: Insufficient documentation

## 2015-10-28 DIAGNOSIS — R339 Retention of urine, unspecified: Secondary | ICD-10-CM | POA: Diagnosis not present

## 2015-10-28 DIAGNOSIS — Z79899 Other long term (current) drug therapy: Secondary | ICD-10-CM | POA: Insufficient documentation

## 2015-10-28 DIAGNOSIS — Z885 Allergy status to narcotic agent status: Secondary | ICD-10-CM | POA: Diagnosis not present

## 2015-10-28 DIAGNOSIS — F17210 Nicotine dependence, cigarettes, uncomplicated: Secondary | ICD-10-CM | POA: Insufficient documentation

## 2015-10-28 DIAGNOSIS — N400 Enlarged prostate without lower urinary tract symptoms: Secondary | ICD-10-CM | POA: Diagnosis present

## 2015-10-28 DIAGNOSIS — N401 Enlarged prostate with lower urinary tract symptoms: Principal | ICD-10-CM | POA: Insufficient documentation

## 2015-10-28 DIAGNOSIS — F329 Major depressive disorder, single episode, unspecified: Secondary | ICD-10-CM | POA: Diagnosis not present

## 2015-10-28 DIAGNOSIS — I10 Essential (primary) hypertension: Secondary | ICD-10-CM | POA: Diagnosis not present

## 2015-10-28 DIAGNOSIS — R300 Dysuria: Secondary | ICD-10-CM | POA: Diagnosis not present

## 2015-10-28 HISTORY — DX: Other allergic rhinitis: J30.89

## 2015-10-28 HISTORY — DX: Major depressive disorder, single episode, unspecified: F32.9

## 2015-10-28 HISTORY — DX: Depression, unspecified: F32.A

## 2015-10-28 HISTORY — PX: TRANSURETHRAL RESECTION OF PROSTATE: SHX73

## 2015-10-28 HISTORY — DX: Personal history of other diseases of the digestive system: Z87.19

## 2015-10-28 HISTORY — DX: Essential (primary) hypertension: I10

## 2015-10-28 LAB — CBC
HCT: 34.3 % — ABNORMAL LOW (ref 39.0–52.0)
HEMATOCRIT: 35.6 % — AB (ref 39.0–52.0)
HEMOGLOBIN: 12.1 g/dL — AB (ref 13.0–17.0)
Hemoglobin: 11.5 g/dL — ABNORMAL LOW (ref 13.0–17.0)
MCH: 30.9 pg (ref 26.0–34.0)
MCH: 30.7 pg (ref 26.0–34.0)
MCHC: 34 g/dL (ref 30.0–36.0)
MCHC: 33.5 g/dL (ref 30.0–36.0)
MCV: 91 fL (ref 78.0–100.0)
MCV: 91.7 fL (ref 78.0–100.0)
PLATELETS: 186 10*3/uL (ref 150–400)
Platelets: 196 10*3/uL (ref 150–400)
RBC: 3.91 MIL/uL — AB (ref 4.22–5.81)
RBC: 3.74 MIL/uL — ABNORMAL LOW (ref 4.22–5.81)
RDW: 13.3 % (ref 11.5–15.5)
RDW: 13.2 % (ref 11.5–15.5)
WBC: 4 10*3/uL (ref 4.0–10.5)
WBC: 4.6 10*3/uL (ref 4.0–10.5)

## 2015-10-28 LAB — BASIC METABOLIC PANEL
ANION GAP: 5 (ref 5–15)
Anion gap: 6 (ref 5–15)
BUN: 18 mg/dL (ref 6–20)
BUN: 17 mg/dL (ref 6–20)
CALCIUM: 9.4 mg/dL (ref 8.9–10.3)
CALCIUM: 9 mg/dL (ref 8.9–10.3)
CO2: 28 mmol/L (ref 22–32)
CO2: 27 mmol/L (ref 22–32)
Chloride: 106 mmol/L (ref 101–111)
Chloride: 107 mmol/L (ref 101–111)
Creatinine, Ser: 1.32 mg/dL — ABNORMAL HIGH (ref 0.61–1.24)
Creatinine, Ser: 1.32 mg/dL — ABNORMAL HIGH (ref 0.61–1.24)
GFR calc Af Amer: >60 mL/min (ref >60)
GLUCOSE: 93 mg/dL (ref 65–99)
GLUCOSE: 101 mg/dL — AB (ref 65–99)
POTASSIUM: 3.8 mmol/L (ref 3.5–5.1)
Potassium: 3.9 mmol/L (ref 3.5–5.1)
Sodium: 139 mmol/L (ref 135–145)
Sodium: 140 mmol/L (ref 135–145)

## 2015-10-28 SURGERY — TRANSURETHRAL RESECTION OF THE PROSTATE WITH GYRUS INSTRUMENTS
Anesthesia: General | Site: Prostate

## 2015-10-28 MED ORDER — LIDOCAINE HCL (CARDIAC) 20 MG/ML IV SOLN
INTRAVENOUS | Status: AC
Start: 2015-10-28 — End: 2015-10-28
  Filled 2015-10-28: qty 5

## 2015-10-28 MED ORDER — MIDAZOLAM HCL 2 MG/2ML IJ SOLN
INTRAMUSCULAR | Status: AC
  Filled 2015-10-28: qty 2

## 2015-10-28 MED ORDER — FENTANYL CITRATE (PF) 100 MCG/2ML IJ SOLN
INTRAMUSCULAR | Status: AC
  Filled 2015-10-28: qty 2

## 2015-10-28 MED ORDER — BACLOFEN 10 MG PO TABS
10.0000 mg | ORAL_TABLET | Freq: Three times a day (TID) | ORAL | Status: DC
  Administered 2015-10-28 – 2015-10-29 (×3): 10 mg via ORAL
  Filled 2015-10-28 (×3): qty 1

## 2015-10-28 MED ORDER — FLUOXETINE HCL 20 MG PO CAPS
40.0000 mg | ORAL_CAPSULE | Freq: Every morning | ORAL | Status: DC
  Administered 2015-10-29: 40 mg via ORAL
  Filled 2015-10-28: qty 2

## 2015-10-28 MED ORDER — LIDOCAINE HCL (CARDIAC) 20 MG/ML IV SOLN
INTRAVENOUS | Status: DC | PRN
  Administered 2015-10-28: 75 mg via INTRAVENOUS

## 2015-10-28 MED ORDER — DIPHENHYDRAMINE HCL 50 MG/ML IJ SOLN: 12.5000 mg | Freq: Four times a day (QID) | INTRAMUSCULAR | Status: DC | PRN

## 2015-10-28 MED ORDER — ONDANSETRON HCL 4 MG/2ML IJ SOLN
INTRAMUSCULAR | Status: AC
  Filled 2015-10-28: qty 2

## 2015-10-28 MED ORDER — BELLADONNA ALKALOIDS-OPIUM 16.2-60 MG RE SUPP: 1.0000 | Freq: Four times a day (QID) | RECTAL | Status: DC | PRN

## 2015-10-28 MED ORDER — TRAZODONE HCL 50 MG PO TABS
75.0000 mg | ORAL_TABLET | Freq: Every day | ORAL | Status: DC
  Administered 2015-10-28: 75 mg via ORAL
  Filled 2015-10-28: qty 2

## 2015-10-28 MED ORDER — EPHEDRINE SULFATE 50 MG/ML IJ SOLN
INTRAMUSCULAR | Status: DC | PRN
  Administered 2015-10-28: 5 mg via INTRAVENOUS

## 2015-10-28 MED ORDER — GLYCOPYRROLATE 0.2 MG/ML IJ SOLN
INTRAMUSCULAR | Status: DC | PRN
  Administered 2015-10-28 (×2): 0.1 mg via INTRAVENOUS

## 2015-10-28 MED ORDER — ONDANSETRON HCL 4 MG/2ML IJ SOLN: 4.0000 mg | Freq: Once | INTRAMUSCULAR | Status: DC | PRN

## 2015-10-28 MED ORDER — CETYLPYRIDINIUM CHLORIDE 0.05 % MT LIQD: 7.0000 mL | Freq: Two times a day (BID) | OROMUCOSAL | Status: DC

## 2015-10-28 MED ORDER — ONDANSETRON HCL 4 MG/2ML IJ SOLN
INTRAMUSCULAR | Status: DC | PRN
  Administered 2015-10-28 (×2): 2 mg via INTRAVENOUS

## 2015-10-28 MED ORDER — DIPHENHYDRAMINE HCL 12.5 MG/5ML PO ELIX: 12.5000 mg | ORAL_SOLUTION | Freq: Four times a day (QID) | ORAL | Status: DC | PRN

## 2015-10-28 MED ORDER — MONTELUKAST SODIUM 10 MG PO TABS
10.0000 mg | ORAL_TABLET | Freq: Every morning | ORAL | Status: DC
  Administered 2015-10-29: 10 mg via ORAL
  Filled 2015-10-28: qty 1

## 2015-10-28 MED ORDER — SODIUM CHLORIDE 0.9 % IR SOLN
Status: DC | PRN
  Administered 2015-10-28: 6000 mL

## 2015-10-28 MED ORDER — HYDROMORPHONE HCL 1 MG/ML IJ SOLN: 0.5000 mg | INTRAMUSCULAR | Status: DC | PRN

## 2015-10-28 MED ORDER — DEXTROSE 5 % IV SOLN
2.0000 g | INTRAVENOUS | Status: DC
  Administered 2015-10-28: 2 g via INTRAVENOUS
  Filled 2015-10-28: qty 2

## 2015-10-28 MED ORDER — PROPOFOL 10 MG/ML IV BOLUS
INTRAVENOUS | Status: AC
  Filled 2015-10-28: qty 20

## 2015-10-28 MED ORDER — PIPERACILLIN-TAZOBACTAM 3.375 G IVPB
INTRAVENOUS | Status: AC
  Filled 2015-10-28: qty 50

## 2015-10-28 MED ORDER — DEXAMETHASONE SODIUM PHOSPHATE 10 MG/ML IJ SOLN
INTRAMUSCULAR | Status: AC
  Filled 2015-10-28: qty 1

## 2015-10-28 MED ORDER — OXYCODONE-ACETAMINOPHEN 5-325 MG PO TABS: 1.0000 | ORAL_TABLET | ORAL | Status: DC | PRN

## 2015-10-28 MED ORDER — FENTANYL CITRATE (PF) 100 MCG/2ML IJ SOLN: 25.0000 ug | INTRAMUSCULAR | Status: DC | PRN

## 2015-10-28 MED ORDER — SODIUM CHLORIDE 0.9 % IV SOLN
INTRAVENOUS | Status: DC
  Administered 2015-10-28 – 2015-10-29 (×3): via INTRAVENOUS

## 2015-10-28 MED ORDER — METOCLOPRAMIDE HCL 5 MG/ML IJ SOLN
INTRAMUSCULAR | Status: DC | PRN
  Administered 2015-10-28: 5 mg via INTRAVENOUS

## 2015-10-28 MED ORDER — ONDANSETRON HCL 4 MG/2ML IJ SOLN: 4.0000 mg | INTRAMUSCULAR | Status: DC | PRN

## 2015-10-28 MED ORDER — KETOROLAC TROMETHAMINE 30 MG/ML IJ SOLN: 30.0000 mg | Freq: Once | INTRAMUSCULAR | Status: DC

## 2015-10-28 MED ORDER — ZOLPIDEM TARTRATE 5 MG PO TABS
5.0000 mg | ORAL_TABLET | Freq: Every evening | ORAL | Status: DC | PRN
Start: 2015-10-28 — End: 2015-10-29

## 2015-10-28 MED ORDER — DEXAMETHASONE SODIUM PHOSPHATE 10 MG/ML IJ SOLN
INTRAMUSCULAR | Status: DC | PRN
  Administered 2015-10-28: 8 mg via INTRAVENOUS

## 2015-10-28 MED ORDER — ACETAMINOPHEN 325 MG PO TABS: 650.0000 mg | ORAL_TABLET | ORAL | Status: DC | PRN

## 2015-10-28 MED ORDER — MEPERIDINE HCL 50 MG/ML IJ SOLN: 6.2500 mg | INTRAMUSCULAR | Status: DC | PRN

## 2015-10-28 MED ORDER — FENTANYL CITRATE (PF) 100 MCG/2ML IJ SOLN
INTRAMUSCULAR | Status: DC | PRN
  Administered 2015-10-28: 25 ug via INTRAVENOUS
  Administered 2015-10-28: 50 ug via INTRAVENOUS
  Administered 2015-10-28: 25 ug via INTRAVENOUS

## 2015-10-28 MED ORDER — PIPERACILLIN-TAZOBACTAM 3.375 G IVPB 30 MIN
3.3750 g | INTRAVENOUS | Status: AC
  Administered 2015-10-28: 3.375 g via INTRAVENOUS
  Filled 2015-10-28: qty 50

## 2015-10-28 MED ORDER — MIDAZOLAM HCL 5 MG/5ML IJ SOLN
INTRAMUSCULAR | Status: DC | PRN
  Administered 2015-10-28 (×2): 1 mg via INTRAVENOUS

## 2015-10-28 MED ORDER — OXYCODONE-ACETAMINOPHEN 5-325 MG PO TABS
1.0000 | ORAL_TABLET | ORAL | Status: DC | PRN
  Administered 2015-10-29: 1 via ORAL
  Filled 2015-10-28: qty 1

## 2015-10-28 MED ORDER — PROPOFOL 10 MG/ML IV BOLUS
INTRAVENOUS | Status: DC | PRN
  Administered 2015-10-28: 100 mg via INTRAVENOUS

## 2015-10-28 SURGICAL SUPPLY — 19 items
BAG URINE DRAINAGE (UROLOGICAL SUPPLIES) ×3 IMPLANT
BAG URO CATCHER STRL LF (MISCELLANEOUS) ×3 IMPLANT
ELECT REM PT RETURN 9FT ADLT (ELECTROSURGICAL) ×3
ELECTRODE REM PT RTRN 9FT ADLT (ELECTROSURGICAL) ×1 IMPLANT
GLOVE BIO SURGEON STRL SZ 6.5 (GLOVE) ×2 IMPLANT
GLOVE BIO SURGEONS STRL SZ 6.5 (GLOVE) ×1
GLOVE BIOGEL M 8.0 STRL (GLOVE) ×3 IMPLANT
GLOVE BIOGEL PI IND STRL 6.5 (GLOVE) ×1 IMPLANT
GLOVE BIOGEL PI INDICATOR 6.5 (GLOVE) ×2
GOWN STRL REUS W/TWL LRG LVL3 (GOWN DISPOSABLE) ×3 IMPLANT
GOWN STRL REUS W/TWL XL LVL3 (GOWN DISPOSABLE) ×3 IMPLANT
HOLDER FOLEY CATH W/STRAP (MISCELLANEOUS) IMPLANT
LOOP CUT BIPOLAR 24F LRG (ELECTROSURGICAL) ×3 IMPLANT
MANIFOLD NEPTUNE II (INSTRUMENTS) ×3 IMPLANT
PACK CYSTO (CUSTOM PROCEDURE TRAY) ×3 IMPLANT
SET ASPIRATION TUBING (TUBING) IMPLANT
SYRINGE IRR TOOMEY STRL 70CC (SYRINGE) IMPLANT
TUBING CONNECTING 10 (TUBING) ×2 IMPLANT
TUBING CONNECTING 10' (TUBING) ×1

## 2015-10-28 NOTE — Transfer of Care (Signed)
Immediate Anesthesia Transfer of Care Note  Patient: [name]  Procedure(s) Performed: Procedure(s): TRANSURETHRAL RESECTION OF THE PROSTATE WITH GYRUS INSTRUMENTS (N/A)  Patient Location: PACU  Anesthesia Type:General  Level of Consciousness: awake, oriented, pateint uncooperative, lethargic and responds to stimulation  Airway & Oxygen Therapy: Patient Spontanous Breathing and Patient connected to face mask oxygen  Post-op Assessment: Report given to RN, Post -op Vital signs reviewed and stable and Patient moving all extremities  Post vital signs: Reviewed and stable  Last Vitals:  Filed Vitals:   10/28/15 1113  BP: 112/77  Pulse: 47  Temp: 36.1 C  Resp: 18    Last Pain: There were no vitals filed for this visit.       Complications: No apparent anesthesia complications

## 2015-10-28 NOTE — Anesthesia Preprocedure Evaluation (Addendum)
Anesthesia Evaluation  Patient identified by MRN, date of birth, ID band Patient awake    Reviewed: Allergy & Precautions, H&P , Patient's Chart, lab work & pertinent test results  Airway Mallampati: III  TM Distance: >3 FB Neck ROM: full    Dental no notable dental hx. (+) Teeth Intact   Pulmonary Current Smoker,    Pulmonary exam normal        Cardiovascular hypertension, Pt. on medications Normal cardiovascular exam     Neuro/Psych negative neurological ROS     GI/Hepatic Neg liver ROS,   Endo/Other  negative endocrine ROS  Renal/GU negative Renal ROS     Musculoskeletal   Abdominal Normal abdominal exam  (+)   Peds  Hematology negative hematology ROS (+)   Anesthesia Other Findings   Reproductive/Obstetrics negative OB ROS                            Anesthesia Physical Anesthesia Plan  ASA: II  Anesthesia Plan: General   Post-op Pain Management:    Induction: Intravenous  Airway Management Planned: LMA  Additional Equipment:   Intra-op Plan:   Post-operative Plan:   Informed Consent: I have reviewed the patients History and Physical, chart, labs and discussed the procedure including the risks, benefits and alternatives for the proposed anesthesia with the patient or authorized representative who has indicated his/her understanding and acceptance.     Plan Discussed with: CRNA and Surgeon  Anesthesia Plan Comments:         Anesthesia Quick Evaluation

## 2015-10-28 NOTE — H&P (Signed)
Urology Admission H&P  Chief Complaint: urinary retention  History of Present Illness: [name] is a 50yo with a hx of BPh with LUTS her for TURP. He has failed medical therapy and currently has an indwelling foley. He has urgency and dysuria with the foley in place  Past Medical History  Diagnosis Date  . BPH (benign prostatic hyperplasia)   . Hypertension   . Environmental and seasonal allergies   . Foley catheter in place   . Depression   . History of hiatal hernia    Past Surgical History  Procedure Laterality Date  . Lumbar disc surgery  11-02-2000    L4 -- L5  . Closed reduction nasal fracture  10-08-2004    and Septoplasty  . Orif right proximal humerus fx and orif right ankle fx  01-27-2007  . 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 Hernia repair      pt states has had hiatal hernia repair   . Appendectomy    . Left ankle surgery       Home Medications:  Prescriptions prior to admission  Medication Sig Dispense Refill Last Dose  . baclofen (LIORESAL) 10 MG tablet Take 10 mg by mouth 3 (three) times daily.   10/28/2015 at 0730  . FLUoxetine (PROZAC) 40 MG capsule Take 40 mg by mouth every morning.   10/28/2015 at 0730  . lisinopril-hydrochlorothiazide (PRINZIDE,ZESTORETIC) 20-12.5 MG tablet Take 1 tablet by mouth every morning.    10/26/2015  . montelukast (SINGULAIR) 10 MG tablet Take 10 mg by mouth every morning.    10/27/2015 at Unknown time  . tamsulosin (FLOMAX) 0.4 MG CAPS capsule Take 1 capsule (0.4 mg total) by mouth daily. (Patient taking differently: Take 0.4 mg by mouth daily after breakfast. ) 30 capsule 1 10/28/2015 at 0730  . traZODone (DESYREL) 150 MG tablet Take 75 mg by mouth at bedtime.    10/27/2015 at 2200   Allergies:  Allergies  Allergen Reactions  . Morphine And Related Hives    History reviewed. No pertinent family history. Social  History:  reports that he has been smoking Cigarettes.  He has a 15 pack-year smoking history. He has never used smokeless tobacco. He reports that he drinks alcohol. He reports that he does not use illicit drugs.  Review of Systems  All other systems reviewed and are negative.   Physical Exam:  Vital signs in last 24 hours: Temp:  [97 F (36.1 C)] 97 F (36.1 C) (07/03 1113) Pulse Rate:  [47] 47 (07/03 1113) Resp:  [18] 18 (07/03 1113) BP: (112)/(77) 112/77 mmHg (07/03 1113) SpO2:  [100 %] 100 % (07/03 1113) Weight:  [81.647 kg (180 lb)] 81.647 kg (180 lb) (07/03 1113) Physical Exam  Constitutional: He is oriented to person, place, and time. He appears well-developed and well-nourished.  HENT:  Head: Normocephalic and atraumatic.  Eyes: EOM are normal. Pupils are equal, round, and reactive to light.  Neck: Normal range of motion. No thyromegaly present.  Cardiovascular: Normal rate and regular rhythm.   Respiratory: Effort normal. No respiratory distress.  GI: Soft. He exhibits no distension.  Musculoskeletal: Normal range of motion.  Neurological: He is alert and oriented to person, place, and time.  Skin: Skin is warm and dry.  Psychiatric: He has a normal mood and affect. His behavior is normal. Judgment and thought content normal.    Laboratory  Data:  Results for orders placed or performed during the hospital encounter of 10/28/15 (from the past 24 hour(s))  Basic metabolic panel     Status: Abnormal   Collection Time: 10/28/15 11:55 AM  Result Value Ref Range   Sodium 139 135 - 145 mmol/L   Potassium 3.8 3.5 - 5.1 mmol/L   Chloride 106 101 - 111 mmol/L   CO2 28 22 - 32 mmol/L   Glucose, Bld 93 65 - 99 mg/dL   BUN 18 6 - 20 mg/dL   Creatinine, Ser 9.14 (H) 0.61 - 1.24 mg/dL   Calcium 9.4 8.9 - 78.2 mg/dL   GFR calc non Af Amer >60 >60 mL/min   GFR calc Af Amer >60 >60 mL/min   Anion gap 5 5 - 15  CBC     Status: Abnormal   Collection Time: 10/28/15 11:55 AM   Result Value Ref Range   WBC 4.0 4.0 - 10.5 K/uL   RBC 3.91 (L) 4.22 - 5.81 MIL/uL   Hemoglobin 12.1 (L) 13.0 - 17.0 g/dL   HCT 95.6 (L) 21.3 - 08.6 %   MCV 91.0 78.0 - 100.0 fL   MCH 30.9 26.0 - 34.0 pg   MCHC 34.0 30.0 - 36.0 g/dL   RDW 57.8 46.9 - 62.9 %   Platelets 196 150 - 400 K/uL   No results found for this or any previous visit (from the past 240 hour(s)). Creatinine:  Recent Labs  10/28/15 1155  CREATININE 1.32*   Baseline Creatinine: 1.3  Impression/Assessment:  50yo with BPH with LUTS, urinary retention  Plan:  The risks/benefits/alternatives to TURp was explained to the patient and he understands and wishes to proceed with surgery  Wilkie Aye 10/28/2015, 12:52 PM

## 2015-10-28 NOTE — Op Note (Signed)
Preoperative diagnosis: BPH  Postoperative diagnosis: BPH  Procedure: 1 cystoscopy 2. Transurethral resection of the prostate  Attending: Wilkie Aye  Anesthesia: General  Estimated blood loss: Minimal  Drains: 22 French foley  Specimens: 1. Prostate Chips  Antibiotics: Rocephin  Findings: Bilobar prostate enlargement. Ureteral orifices in normal anatomic location.   Indications: Patient is a 50 year old male with a history of BPH and urinary retention requiring indwelling foley.  After discussing treatment options, they decided proceed with transurethral resection of the prostate.  Procedure her in detail: The patient was brought to the operating room and a brief timeout was done to ensure correct patient, correct procedure, correct site.  General anesthesia was administered patient was placed in dorsal lithotomy position.  Their genitalia was then prepped and draped in usual sterile fashion.  A rigid 22 French cystoscope was passed in the urethra and the bladder.  Bladder was inspected and we noted no masses or lesions.  the ureteral orifices were in the normal orthotopic locations. removed the cystoscope and placed a resectoscope into the bladder. We then turned our attention to the prostate resection. Using the bipolar resectoscope we resected the median lobe first from the bladder neck to the verumontanum. We then started at the 12 oclock position on the left lobe and resection to the 6 o'clock position from the bladder neck to the verumontanum. We then did the same resection of the right lobe. Once the resection was complete we then cauterized individual bleeders. We then removed the prostate chips and sent them for pathology.  We then re-inspected the prostatic fossa and found no residual bleeding.  the bladder was then drained, a 22 French foley was placed and this concluded the procedure which was well tolerated by patient.  Complications: None  Condition: Stable, extubated,  transferred to PACU  Plan: Patient is admitted overnight with continuous bladder irrigation. If their urine is clear tomorrow they will be discharged home and followup in 5 days for foley catheter removal and pathology discussion.

## 2015-10-28 NOTE — Anesthesia Postprocedure Evaluation (Signed)
Anesthesia Post Note  Patient: [name]  Procedure(s) Performed: Procedure(s) (LRB): TRANSURETHRAL RESECTION OF THE PROSTATE WITH GYRUS INSTRUMENTS (N/A)  Patient location during evaluation: PACU Anesthesia Type: General Level of consciousness: sedated Pain management: pain level controlled Vital Signs Assessment: post-procedure vital signs reviewed and stable Respiratory status: spontaneous breathing Cardiovascular status: stable Postop Assessment: no signs of nausea or vomiting Anesthetic complications: no     Last Vitals:  Filed Vitals:   10/28/15 1415 10/28/15 1430  BP: 111/67 108/77  Pulse: 52 50  Temp:    Resp: 14 13    Last Pain:  Filed Vitals:   10/28/15 1432  PainSc: Asleep   Pain Goal:                 Aysen Shieh JR,JOHN Senica Crall

## 2015-10-28 NOTE — Anesthesia Procedure Notes (Signed)
Procedure Name: LMA Insertion Date/Time: 10/28/2015 1:11 PM Performed by: Edison Pace Pre-anesthesia Checklist: Patient identified, Emergency Drugs available, Suction available, Patient being monitored and Timeout performed Patient Re-evaluated:Patient Re-evaluated prior to inductionOxygen Delivery Method: Circle system utilized Preoxygenation: Pre-oxygenation with 100% oxygen Intubation Type: IV induction LMA: LMA with gastric port inserted LMA Size: 4.0 Number of attempts: 1 Placement Confirmation: positive ETCO2 Tube secured with: Tape Dental Injury: Teeth and Oropharynx as per pre-operative assessment

## 2015-10-28 NOTE — Progress Notes (Signed)
Mr. Lempke reports drinking 20 oz coke this morning at 9am.  Dr. Leta Jungling notified, no new orders given.

## 2015-10-29 DIAGNOSIS — N401 Enlarged prostate with lower urinary tract symptoms: Secondary | ICD-10-CM | POA: Diagnosis not present

## 2015-10-29 LAB — BASIC METABOLIC PANEL
ANION GAP: 5 (ref 5–15)
BUN: 16 mg/dL (ref 6–20)
CO2: 25 mmol/L (ref 22–32)
Calcium: 9.2 mg/dL (ref 8.9–10.3)
Chloride: 109 mmol/L (ref 101–111)
Creatinine, Ser: 1.17 mg/dL (ref 0.61–1.24)
Glucose, Bld: 141 mg/dL — ABNORMAL HIGH (ref 65–99)
POTASSIUM: 4 mmol/L (ref 3.5–5.1)
SODIUM: 139 mmol/L (ref 135–145)

## 2015-10-29 LAB — CBC
HCT: 33.7 % — ABNORMAL LOW (ref 39.0–52.0)
HEMOGLOBIN: 11.5 g/dL — AB (ref 13.0–17.0)
MCH: 31.3 pg (ref 26.0–34.0)
MCHC: 34.1 g/dL (ref 30.0–36.0)
MCV: 91.6 fL (ref 78.0–100.0)
PLATELETS: 182 10*3/uL (ref 150–400)
RBC: 3.68 MIL/uL — AB (ref 4.22–5.81)
RDW: 13.2 % (ref 11.5–15.5)
WBC: 9.5 10*3/uL (ref 4.0–10.5)

## 2015-10-29 MED ORDER — CEPHALEXIN 500 MG PO CAPS: 500.0000 mg | ORAL_CAPSULE | Freq: Three times a day (TID) | ORAL | Status: DC

## 2015-10-29 MED ORDER — OXYCODONE-ACETAMINOPHEN 5-325 MG PO TABS: 1.0000 | ORAL_TABLET | ORAL | Status: DC | PRN

## 2015-10-29 NOTE — Progress Notes (Signed)
Discharge instruction reviewed, questions and concerns denied. Additional instruction provided regarding foley catheter care while at home. F/u visit has been scheduled. Pt aware of date and time.

## 2015-10-29 NOTE — Progress Notes (Signed)
Walked by patients room after he was taken out for DC. Wallet left on bed. Will call pt for pickup

## 2015-10-29 NOTE — Progress Notes (Signed)
Wallet was secured by security representative. Pt is to come pick up

## 2015-10-29 NOTE — Progress Notes (Signed)
Pt does not want to walk in the hallway. Pt states "the only time I want to walk is when they tell me I can go on my way home." Pt educated on importance of walking after surgery. Pt has transferred from bed to chair multiple times overnight.

## 2015-10-29 NOTE — Discharge Summary (Signed)
Date of admission: 10/28/2015  Date of discharge: 10/29/2015  Admission diagnosis: BPH  Discharge diagnosis: BPH  Secondary diagnoses:  Patient Active Problem List   Diagnosis Date Noted  . BPH (benign prostatic hyperplasia) 10/28/2015  . Fracture of fibula with tibia, right, closed 12/09/2011  . Muscle weakness (generalized) 12/09/2011  . Difficulty in walking(719.7) 12/09/2011    History and Physical: For full details, please see admission history and physical. Briefly, [name] is a 50 y.o. year old patient with BPH and urinary retention admitted s/p TURP.   Hospital Course: Patient tolerated the procedure well.  He was then transferred to the floor after an uneventful PACU stay.  His hospital course was uncomplicated.  On POD#1  he had met discharge criteria: was eating a regular diet, was up and ambulating independently,  pain was well controlled, , and was ready to for discharge. Foley was clear yellow with CBI off.   Laboratory values:   Recent Labs  10/28/15 1155 10/28/15 1416 10/29/15 0517  WBC 4.0 4.6 9.5  HGB 12.1* 11.5* 11.5*  HCT 35.6* 34.3* 33.7*    Recent Labs  10/28/15 1155 10/28/15 1416 10/29/15 0517  NA 139 140 139  K 3.8 3.9 4.0  CL 106 107 109  CO2 _0 GLUCOSE 93 101* 141*  BUN _1 CREATININE 1.32* 1.32* 1.17  CALCIUM 9.4 9.0 9.2   No results for input(s): LABPT, INR in the last 72 hours. No results for input(s): LABURIN in the last 72 hours. Results for orders placed or performed during the hospital encounter of 01/24/07  Pathologist smear review     Status: None   Collection Time: 02/06/07  8:07 PM  Result Value Ref Range Status   Tech Review   Final    MILD NORMOCYTIC ANEMIA, LEUKOCYTOSIS, THROMBOCYTOSIS. Reviewed By Violet Baldy, M.D. 02/07/07.  Wound culture     Status: None   Collection Time: 02/07/07  6:25 PM  Result Value Ref Range Status   Specimen Description WOUND RIGHT SHOULDER  Final   Special Requests  PATIENT ON FOLLOWING ANCEF  Final   Gram Stain   Final    RARE WBC PRESENT, PREDOMINANTLY PMN NO SQUAMOUS EPITHELIAL CELLS SEEN NO ORGANISMS SEEN   Culture FEW ENTEROBACTER CLOACAE  Final   Report Status 02/10/2007 FINAL  Final   Organism ID, Bacteria ENTEROBACTER CLOACAE  Final      Susceptibility   Enterobacter cloacae - MIC    CEFAZOLIN >=64 Resistant     CEFEPIME <=1 Sensitive     CEFOTETAN <=4 Resistant     CEFTAZIDIME <=1 Sensitive     CEFTRIAXONE <=1 Sensitive     CIPROFLOXACIN <=0.25 Sensitive     GENTAMICIN <=1 Sensitive     IMIPENEM <=1 Sensitive     PIP/TAZO <=4 Sensitive     TOBRAMYCIN <=1 Sensitive     TRIMETH/SULFA <=20 Sensitive   Anaerobic culture     Status: None   Collection Time: 02/07/07  6:25 PM  Result Value Ref Range Status   Specimen Description WOUND RIGHT SHOULDER  Final   Special Requests PATIENT ON FOLLOWING  ANCEF  Final   Gram Stain   Final    RARE WBC PRESENT, PREDOMINANTLY PMN NO SQUAMOUS EPITHELIAL CELLS SEEN NO ORGANISMS SEEN   Culture NO ANAEROBES ISOLATED  Final   Report Status 02/12/2007 FINAL  Final  Wound culture     Status: None   Collection Time: 02/07/07  6:30  PM  Result Value Ref Range Status   Specimen Description WOUND RIGHT SHOULDER  Final   Special Requests PATIENT ON FOLLOWING ANCEF  Final   Gram Stain   Final    NO WBC SEEN NO SQUAMOUS EPITHELIAL CELLS SEEN NO ORGANISMS SEEN   Culture   Final    FEW PSEUDOMONAS AERUGINOSA MODERATE ENTEROBACTER AEROGENES   Report Status 02/11/2007 FINAL  Final   Organism ID, Bacteria PSEUDOMONAS AERUGINOSA  Final   Organism ID, Bacteria ENTEROBACTER AEROGENES  Final      Susceptibility   Enterobacter aerogenes - MIC    CEFAZOLIN >=64 Resistant     CEFEPIME <=1 Sensitive     CEFOTETAN 8 Resistant     CEFTAZIDIME <=1 Sensitive     CEFTRIAXONE <=1 Sensitive     CIPROFLOXACIN <=0.25 Sensitive     GENTAMICIN <=1 Sensitive     IMIPENEM <=1 Sensitive     PIP/TAZO <=4 Sensitive      TOBRAMYCIN <=1 Sensitive     TRIMETH/SULFA <=20 Sensitive    Pseudomonas aeruginosa - MIC    CEFEPIME 2 Sensitive     CEFTAZIDIME 4 Sensitive     CIPROFLOXACIN <=0.25 Sensitive     GENTAMICIN <=1 Sensitive     IMIPENEM <=1 Sensitive     PIP/TAZO 8 Sensitive     TOBRAMYCIN <=1 Sensitive   Anaerobic culture     Status: None   Collection Time: 02/07/07  6:30 PM  Result Value Ref Range Status   Specimen Description WOUND RIGHT SHOULDER  Final   Special Requests PATIENT ON FOLLOWING ANCEF  Final   Gram Stain   Final    NO WBC SEEN NO SQUAMOUS EPITHELIAL CELLS SEEN NO ORGANISMS SEEN   Culture NO ANAEROBES ISOLATED  Final   Report Status 02/12/2007 FINAL  Final    Disposition: Home  Discharge instruction: The patient was instructed to be ambulatory but told to refrain from heavy lifting, strenuous activity, or driving. Continue foley until follow up  Discharge medications:   Medication List    TAKE these medications        baclofen 10 MG tablet  Commonly known as:  LIORESAL  Take 10 mg by mouth 3 (three) times daily.     FLUoxetine 40 MG capsule  Commonly known as:  PROZAC  Take 40 mg by mouth every morning.     lisinopril-hydrochlorothiazide 20-12.5 MG tablet  Commonly known as:  PRINZIDE,ZESTORETIC  Take 1 tablet by mouth every morning.     montelukast 10 MG tablet  Commonly known as:  SINGULAIR  Take 10 mg by mouth every morning.     oxyCODONE-acetaminophen 5-325 MG tablet  Commonly known as:  ROXICET  Take 1 tablet by mouth every 4 (four) hours as needed for severe pain.     tamsulosin 0.4 MG Caps capsule  Commonly known as:  FLOMAX  Take 1 capsule (0.4 mg total) by mouth daily.     traZODone 150 MG tablet  Commonly known as:  DESYREL  Take 75 mg by mouth at bedtime.        Followup:      Follow-up Information    Follow up with Nicolette Bang, MD.   Specialty:  Urology   Why:  as scheduled   Contact information:   [place]  [phone]

## 2015-11-28 ENCOUNTER — Encounter: Payer: Self-pay | Admitting: Neurology

## 2015-11-28 ENCOUNTER — Ambulatory Visit (INDEPENDENT_AMBULATORY_CARE_PROVIDER_SITE_OTHER): Payer: Medicaid Other | Admitting: Neurology

## 2015-11-28 DIAGNOSIS — R269 Unspecified abnormalities of gait and mobility: Secondary | ICD-10-CM

## 2015-11-28 DIAGNOSIS — G8250 Quadriplegia, unspecified: Secondary | ICD-10-CM

## 2015-11-28 HISTORY — DX: Unspecified abnormalities of gait and mobility: R26.9

## 2015-11-28 HISTORY — DX: Quadriplegia, unspecified: G82.50

## 2015-11-28 MED ORDER — BACLOFEN 10 MG PO TABS: 15.0000 mg | ORAL_TABLET | Freq: Three times a day (TID) | ORAL | 3 refills | Status: DC
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
## 2015-11-28 NOTE — Patient Instructions (Addendum)
We will increase the baclofen to 1.5 tablets three times a day. We will get MRI evaluation of the neck.  Fall Prevention in the Home  Falls can cause injuries and can affect people from all age groups. There are many simple things that you can do to make your home safe and to help prevent falls. WHAT CAN I DO ON THE OUTSIDE OF MY HOME?  Regularly repair the edges of walkways and driveways and fix any cracks.  Remove high doorway thresholds.  Trim any shrubbery on the main path into your home.  Use bright outdoor lighting.  Clear walkways of debris and clutter, including tools and rocks.  Regularly check that handrails are securely fastened and in good repair. Both sides of any steps should have handrails.  Install guardrails along the edges of any raised decks or porches.  Have leaves, snow, and ice cleared regularly.  Use sand or salt on walkways during winter months.  In the garage, clean up any spills right away, including grease or oil spills. WHAT CAN I DO IN THE BATHROOM?  Use night lights.  Install grab bars by the toilet and in the tub and shower. Do not use towel bars as grab bars.  Use non-skid mats or decals on the floor of the tub or shower.  If you need to sit down while you are in the shower, use a plastic, non-slip stool.Marland Kitchen  Keep the floor dry. Immediately clean up any water that spills on the floor.  Remove soap buildup in the tub or shower on a regular basis.  Attach bath mats securely with double-sided non-slip rug tape.  Remove throw rugs and other tripping hazards from the floor. WHAT CAN I DO IN THE BEDROOM?  Use night lights.  Make sure that a bedside light is easy to reach.  Do not use oversized bedding that drapes onto the floor.  Have a firm chair that has side arms to use for getting dressed.  Remove throw rugs and other tripping hazards from the floor. WHAT CAN I DO IN THE KITCHEN?   Clean up any spills right away.  Avoid walking  on wet floors.  Place frequently used items in easy-to-reach places.  If you need to reach for something above you, use a sturdy step stool that has a grab bar.  Keep electrical cables out of the way.  Do not use floor polish or wax that makes floors slippery. If you have to use wax, make sure that it is non-skid floor wax.  Remove throw rugs and other tripping hazards from the floor. WHAT CAN I DO IN THE STAIRWAYS?  Do not leave any items on the stairs.  Make sure that there are handrails on both sides of the stairs. Fix handrails that are broken or loose. Make sure that handrails are as long as the stairways.  Check any carpeting to make sure that it is firmly attached to the stairs. Fix any carpet that is loose or worn.  Avoid having throw rugs at the top or bottom of stairways, or secure the rugs with carpet tape to prevent them from moving.  Make sure that you have a light switch at the top of the stairs and the bottom of the stairs. If you do not have them, have them installed. WHAT ARE SOME OTHER FALL PREVENTION TIPS?  Wear closed-toe shoes that fit well and support your feet. Wear shoes that have rubber soles or low heels.  When you use a  stepladder, make sure that it is completely opened and that the sides are firmly locked. Have someone hold the ladder while you are using it. Do not climb a closed stepladder.  Add color or contrast paint or tape to grab bars and handrails in your home. Place contrasting color strips on the first and last steps.  Use mobility aids as needed, such as canes, walkers, scooters, and crutches.  Turn on lights if it is dark. Replace any light bulbs that burn out.  Set up furniture so that there are clear paths. Keep the furniture in the same spot.  Fix any uneven floor surfaces.  Choose a carpet design that does not hide the edge of steps of a stairway.  Be aware of any and all pets.  Review your medicines with your healthcare provider.  Some medicines can cause dizziness or changes in blood pressure, which increase your risk of falling. Talk with your health care provider about other ways that you can decrease your risk of falls. This may include working with a physical therapist or trainer to improve your strength, balance, and endurance.   This information is not intended to replace advice given to you by your health care provider. Make sure you discuss any questions you have with your health care provider.   Document Released: 04/03/2002 Document Revised: 08/28/2014 Document Reviewed: 05/18/2014 Elsevier Interactive Patient Education [employer]

## 2015-12-14 ENCOUNTER — Ambulatory Visit
Admission: RE | Admit: 2015-12-14 | Discharge: 2015-12-14 | Disposition: A | Discharge status: Home / Self Care | Payer: Medicaid Other | Source: Ambulatory Visit | Attending: Neurology | Admitting: Neurology

## 2015-12-14 DIAGNOSIS — R269 Unspecified abnormalities of gait and mobility: Secondary | ICD-10-CM

## 2015-12-14 DIAGNOSIS — G8250 Quadriplegia, unspecified: Secondary | ICD-10-CM

## 2015-12-16 ENCOUNTER — Telehealth: Payer: Self-pay | Admitting: Neurology

## 2015-12-16 DIAGNOSIS — G801 Spastic diplegic cerebral palsy: Secondary | ICD-10-CM

## 2015-12-16 NOTE — Telephone Encounter (Signed)
  I called patient. The MRI the cervical spine shows no problems with spinal cord compression or prior evidence of spinal cord injury. The patient denies any family history of progressive spastic walking problems. I will send the patient back for MRI of the thoracic spine. MRI the cervical spine does not explain his current gait disorder.   MRI cervical 12/16/15:  IMPRESSION:  This MRI of the cervical spine without contrast shows the following: 1.    There is minimal spinal stenosis at C5-C6 and borderline spinal stenosis at C3-C4 and C4-C5.  There are milder degenerative changes at the other cervical levels. There is no nerve root impingement at any of these levels.  2.    The spinal cord is normal signal.     3.    No acute findings.

## 2015-12-31 ENCOUNTER — Ambulatory Visit
Admission: RE | Admit: 2015-12-31 | Discharge: 2015-12-31 | Disposition: A | Discharge status: Home / Self Care | Payer: Medicaid Other | Source: Ambulatory Visit | Attending: Neurology | Admitting: Neurology

## 2015-12-31 DIAGNOSIS — G801 Spastic diplegic cerebral palsy: Secondary | ICD-10-CM

## 2016-01-02 ENCOUNTER — Telehealth: Payer: Self-pay | Admitting: Neurology

## 2016-01-02 DIAGNOSIS — IMO0002 Reserved for concepts with insufficient information to code with codable children: Secondary | ICD-10-CM

## 2016-01-02 DIAGNOSIS — R269 Unspecified abnormalities of gait and mobility: Secondary | ICD-10-CM

## 2016-01-02 NOTE — Telephone Encounter (Signed)
I called patient. MRI of the thoracic spine and cervical spine do not show an etiology for his progressive gait disorder. We will do blood work. Something such as a hereditary spastic paraparesis should be considered.   MRI thoracic 01/01/16:  IMPRESSION:  Normal MRI thoracic spine (without).

## 2016-03-03 ENCOUNTER — Telehealth: Payer: Self-pay

## 2016-03-03 ENCOUNTER — Ambulatory Visit: Payer: Medicaid Other | Admitting: Adult Health

## 2016-03-03 NOTE — Telephone Encounter (Signed)
Patient did not show to appt today  

## 2016-03-04 ENCOUNTER — Encounter: Payer: Self-pay | Admitting: Adult Health

## 2016-03-16 ENCOUNTER — Encounter (INDEPENDENT_AMBULATORY_CARE_PROVIDER_SITE_OTHER): Payer: Medicaid Other | Admitting: Adult Health

## 2016-03-16 NOTE — Progress Notes (Signed)
Patient did not have lab work completed. Today he completed lab work that was ordered in September. He rescheduled for 12/5. He was not seen.   This encounter was created in error - please disregard.

## 2016-03-23 ENCOUNTER — Telehealth: Payer: Self-pay | Admitting: Neurology

## 2016-03-23 LAB — LYME, WESTERN BLOT, SERUM (REFLEXED)
IGG P30 AB.: ABSENT
IGG P45 AB.: ABSENT
IGG P66 AB.: ABSENT
IGM P39 AB.: ABSENT
IgG P18 Ab.: ABSENT
IgG P23 Ab.: ABSENT
IgG P28 Ab.: ABSENT
IgG P39 Ab.: ABSENT
IgG P93 Ab.: ABSENT
IgM P41 Ab.: ABSENT
LYME IGM WB: NEGATIVE
Lyme IgG Wb: NEGATIVE

## 2016-03-23 LAB — PARANEOPLASTIC PROFILE 1
Neuronal Nuclear (Hu) Antibody (IB): <1:10 {titer}
Purkinje Cell (Yo) Autoantobodies- IFA: <1:10 {titer}

## 2016-03-23 LAB — ENA+DNA/DS+SJORGEN'S: DSDNA AB: 17 [IU]/mL — AB (ref 0–9)

## 2016-03-23 LAB — B. BURGDORFI ANTIBODIES: Lyme IgG/IgM Ab: 1.07 {ISR} — ABNORMAL HIGH (ref 0.00–0.90)

## 2016-03-23 LAB — HIV ANTIBODY (ROUTINE TESTING W REFLEX): HIV SCREEN 4TH GENERATION: NONREACTIVE

## 2016-03-23 LAB — SEDIMENTATION RATE: SED RATE: 2 mm/h (ref 0–30)

## 2016-03-23 LAB — RPR: RPR: NONREACTIVE

## 2016-03-23 LAB — ANGIOTENSIN CONVERTING ENZYME: ANGIO CONVERT ENZYME: 40 U/L (ref 14–82)

## 2016-03-23 LAB — HTLV I+II ANTIBODIES, (EIA), BLD: HTLV I/II AB: NEGATIVE

## 2016-03-23 LAB — VITAMIN B12: Vitamin B-12: 381 pg/mL (ref 211–946)

## 2016-03-23 LAB — ANA W/REFLEX: Anti Nuclear Antibody(ANA): POSITIVE — AB

## 2016-03-23 NOTE — Telephone Encounter (Signed)
I called patient. Blood work was relatively unremarkable, and a was positive, but only 1 antibody on the panel was elevated slightly, not likely to be clinically significant.  The Lyme antibody panel was positive, but the Western blot was not consistent with Lyme disease.  The patient could have a hereditary spastic paraparesis, he reports no family history, however.  We may consider MRI of the brain in the near future to fully exclude mild name disease. MRI of the cervical spine and thoracic spine did not show an etiology for his gait problem.

## 2016-03-31 ENCOUNTER — Encounter: Payer: Self-pay | Admitting: Adult Health

## 2016-03-31 ENCOUNTER — Ambulatory Visit (INDEPENDENT_AMBULATORY_CARE_PROVIDER_SITE_OTHER): Payer: Medicaid Other | Admitting: Adult Health

## 2016-03-31 VITALS — BP 108/74 | HR 75 | Ht 73.5 in | Wt 199.6 lb

## 2016-03-31 DIAGNOSIS — R269 Unspecified abnormalities of gait and mobility: Secondary | ICD-10-CM

## 2016-03-31 DIAGNOSIS — G8250 Quadriplegia, unspecified: Secondary | ICD-10-CM | POA: Diagnosis not present

## 2016-03-31 MED ORDER — BACLOFEN 20 MG PO TABS: 20.0000 mg | ORAL_TABLET | Freq: Three times a day (TID) | ORAL | 5 refills | Status: DC
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
## 2016-03-31 NOTE — Patient Instructions (Signed)
Increase Baclofen 20 mg three times a day MRI brain  If your symptoms worsen or you develop new symptoms please let us know.

## 2016-03-31 NOTE — Progress Notes (Signed)
I have read the note, and I agree with the clinical assessment and plan.  [name] KEITH   

## 2016-04-14 ENCOUNTER — Other Ambulatory Visit: Payer: Self-pay | Admitting: Adult Health

## 2016-04-21 ENCOUNTER — Ambulatory Visit
Admission: RE | Admit: 2016-04-21 | Discharge: 2016-04-21 | Disposition: A | Discharge status: Home / Self Care | Payer: Medicaid Other | Source: Ambulatory Visit | Attending: Adult Health | Admitting: Adult Health

## 2016-04-21 DIAGNOSIS — R269 Unspecified abnormalities of gait and mobility: Secondary | ICD-10-CM

## 2016-04-21 DIAGNOSIS — G8250 Quadriplegia, unspecified: Secondary | ICD-10-CM

## 2016-04-21 MED ORDER — GADOBENATE DIMEGLUMINE 529 MG/ML IV SOLN
19.0000 mL | Freq: Once | INTRAVENOUS | Status: AC | PRN
  Administered 2016-04-21: 19 mL via INTRAVENOUS

## 2016-04-23 ENCOUNTER — Telehealth: Payer: Self-pay | Admitting: Neurology

## 2016-04-23 DIAGNOSIS — R9089 Other abnormal findings on diagnostic imaging of central nervous system: Secondary | ICD-10-CM

## 2016-04-23 DIAGNOSIS — R269 Unspecified abnormalities of gait and mobility: Secondary | ICD-10-CM

## 2016-04-23 NOTE — Telephone Encounter (Signed)
    MRI brain results  04/23/16:  IMPRESSION:  This is an abnormal MRI of the brain with and without contrast showing the following: 1.    There is extensive T2/FLAIR hyperintense signal change in both hemispheres with some foci also noted in the brainstem. Associated with this is mild generalized cortical atrophy and corpus callosal atrophy. Although nonspecific, this could be seen with chronic microvascular ischemic change, cerebral palsy or longstanding demyelinating disease.    Due to the widespread nature of the changes, genetic leukodystrophies need to also be considered. 2.   There are no acute findings and there is a normal enhancement pattern.

## 2016-04-23 NOTE — Telephone Encounter (Signed)
Patient returning Dr. Anne HahnWillis' call. Please call

## 2016-04-23 NOTE — Telephone Encounter (Signed)
I called patient. The MRI the brain shows extensive white matter changes, we will consider lumbar puncture to fully exclude demyelinating disease. The patient claims he has never had severe hypertension in the past. He is on blood pressure medications currently.

## 2016-04-23 NOTE — Telephone Encounter (Signed)
-----   Message from Guy Begin, RN sent at 04/23/2016  9:04 AM EST -----   ----- Message ----- From: Asa Lente, MD Sent: 04/21/2016   1:03 PM To: Butch Penny, NP

## 2016-04-23 NOTE — Addendum Note (Signed)
Addended by: Stephanie Acre on: 04/23/2016 02:40 PM   Modules accepted: Orders

## 2016-05-01 ENCOUNTER — Ambulatory Visit
Admission: RE | Admit: 2016-05-01 | Discharge: 2016-05-01 | Disposition: A | Discharge status: Home / Self Care | Payer: Medicaid Other | Source: Ambulatory Visit | Attending: Neurology | Admitting: Neurology

## 2016-05-01 VITALS — BP 125/63 | HR 56

## 2016-05-01 DIAGNOSIS — R269 Unspecified abnormalities of gait and mobility: Secondary | ICD-10-CM

## 2016-05-01 DIAGNOSIS — R9089 Other abnormal findings on diagnostic imaging of central nervous system: Secondary | ICD-10-CM

## 2016-05-01 DIAGNOSIS — M6281 Muscle weakness (generalized): Secondary | ICD-10-CM

## 2016-05-01 LAB — CSF CELL COUNT WITH DIFFERENTIAL
RBC COUNT CSF: 2 {cells}/uL (ref 0–10)
WBC CSF: 0 {cells}/uL (ref 0–5)

## 2016-05-01 LAB — GLUCOSE, CSF: GLUCOSE CSF: 64 mg/dL (ref 43–76)

## 2016-05-01 LAB — PROTEIN, CSF: Total Protein, CSF: 30 mg/dL (ref 15–45)

## 2016-05-01 NOTE — Discharge Instructions (Signed)

## 2016-05-01 NOTE — Progress Notes (Signed)
1 SST tube drawn from left AC to go with spinal fluid. Site is unremarkable and pt tolerated well. Discharge instructions explained to pt

## 2016-05-03 LAB — ANGIOTENSIN CONVERTING ENZYME, CSF: ACE, CSF: 3 U/L (ref <=15)

## 2016-05-03 LAB — VDRL, CSF: SYPHILIS VDRL QUANT CSF: NONREACTIVE

## 2016-05-07 ENCOUNTER — Telehealth: Payer: Self-pay | Admitting: Neurology

## 2016-05-07 DIAGNOSIS — R9089 Other abnormal findings on diagnostic imaging of central nervous system: Secondary | ICD-10-CM

## 2016-05-07 LAB — B. BURGDORFI ANTIBODIES, CSF
Albumin Ratio: 0.0027
INTERPRETATION: NEGATIVE

## 2016-05-07 LAB — OLIGOCLONAL BANDS, CSF + SERM

## 2016-05-07 NOTE — Telephone Encounter (Signed)
Patient called in reference to Lumbar puncture results. Please call

## 2016-05-07 NOTE — Telephone Encounter (Signed)
I called patient. The spinal fluid results are all in with exception of the Lyme antibody panel. The spinal fluid is negative with exception that the patient has greater than 5 oligoclonal bands in the spinal fluid, this would be suggestive but not specific for multiple sclerosis. I will check a visual evoked response test, I will have the patient come back in for a revisit, the patient could potentially have primary progressive multiple sclerosis, we may consider a medication such as Ocrevus.

## 2016-05-07 NOTE — Addendum Note (Signed)
Addended by: Stephanie Acre on: 05/07/2016 01:00 PM   Modules accepted: Orders

## 2016-05-07 NOTE — Telephone Encounter (Signed)
Called pt and scheduled work-in appt for Tues 1/23 w/ noon arrival time.

## 2016-05-19 ENCOUNTER — Encounter: Payer: Self-pay | Admitting: Neurology

## 2016-05-19 ENCOUNTER — Ambulatory Visit (INDEPENDENT_AMBULATORY_CARE_PROVIDER_SITE_OTHER): Payer: Medicaid Other | Admitting: Neurology

## 2016-05-19 VITALS — BP 98/70 | HR 51 | Ht 73.5 in | Wt 194.0 lb

## 2016-05-19 DIAGNOSIS — G35 Multiple sclerosis: Secondary | ICD-10-CM

## 2016-05-19 DIAGNOSIS — G8250 Quadriplegia, unspecified: Secondary | ICD-10-CM | POA: Diagnosis not present

## 2016-05-19 DIAGNOSIS — Z5181 Encounter for therapeutic drug level monitoring: Secondary | ICD-10-CM | POA: Diagnosis not present

## 2016-05-19 DIAGNOSIS — M21372 Foot drop, left foot: Secondary | ICD-10-CM

## 2016-05-19 DIAGNOSIS — R269 Unspecified abnormalities of gait and mobility: Secondary | ICD-10-CM

## 2016-05-19 DIAGNOSIS — M21371 Foot drop, right foot: Secondary | ICD-10-CM | POA: Diagnosis not present

## 2016-05-19 HISTORY — DX: Multiple sclerosis: G35

## 2016-05-19 NOTE — Progress Notes (Signed)
Reason for visit: Multiple sclerosis  [name] is an 51 y.o. male  History of present illness:  [name] is a 51 year old right-handed black male with a history of a progressive spastic quadriparesis. The patient is walking with a walker, he has not had any falls since last seen. The patient has undergone MRI evaluation of the cervical spine and thoracic spine that did not show evidence of spinal cord compression or spinal cord lesions. MRI of the brain has shown extensive white matter changes. The patient does have a history of high blood pressure, but his blood pressures have never been extremely elevated. His history is complicated by the fact that he has a prior history of crack cocaine abuse. The patient was sent for lumbar puncture, the studies were unremarkable with exception that the patient had greater than 5 oligoclonal bands in the spinal fluid suggestive of demyelinating disease such as multiple sclerosis. A visual evoked response test has been ordered, this has not yet been done. The patient returns to the office today for an evaluation. He has been set up for physical therapy, this has not yet taken place. The patient himself denies any memory issues, he denies any change in control of the bowels or the bladder.  Past Medical History:  Diagnosis Date  . Abnormality of gait 11/28/2015  . BPH (benign prostatic hyperplasia)   . Depression   . Environmental and seasonal allergies   . History of hiatal hernia   . Hypertension   . 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 APPENDECTOMY    . CLOSED REDUCTION NASAL FRACTURE  10-08-2004   and Septoplasty  . HERNIA REPAIR     pt states has had hiatal hernia repair   . left ankle surgery     . LUMBAR DISC SURGERY  11-02-2000   L4 -- L5  . ORIF RIGHT PROXIMAL HUMERUS FX AND ORIF RIGHT ANKLE FX  01-27-2007  .  RIGHT SHOULDER MANIPULATION WITH HARDWARE REMOVAL/  RIGHT TIBIA NONUNION SITE  HARDWARE REMOVAL AND EXCHANGED AND BONE GRAFT  AND ALLOGRAFT  05-10-2007  . TRANSURETHRAL RESECTION OF PROSTATE N/A 10/28/2015   Procedure: TRANSURETHRAL RESECTION OF THE PROSTATE WITH GYRUS INSTRUMENTS;  Surgeon: Malen Gauze, MD;  Location: [facility];  Service: Urology;  Laterality: N/A;    Family History  Problem Relation Age of Onset  . Colon cancer Mother     Social history:  reports that he has been smoking Cigarettes.  He has a 12.00 pack-year smoking history. He has never used smokeless tobacco. He reports that he drinks alcohol. He reports that he does not use drugs.    Allergies  Allergen Reactions  . Morphine And Related Hives    Medications:  Prior to Admission medications   Medication Sig Start Date End Date Taking? Authorizing Provider  baclofen (LIORESAL) 20 MG tablet Take 1 tablet (20 mg total) by mouth 3 (three) times daily. 03/31/16  Yes Butch Penny, NP  FLUoxetine (PROZAC) 40 MG capsule Take 40 mg by mouth every morning.   Yes Historical Provider, MD  lisinopril-hydrochlorothiazide (PRINZIDE,ZESTORETIC) 20-12.5 MG tablet Take 1 tablet by mouth every morning.    Yes Historical Provider, MD  montelukast (SINGULAIR) 10 MG tablet Take 10 mg by mouth every morning.    Yes Historical Provider, MD  tamsulosin (FLOMAX) 0.4 MG CAPS capsule Take 1 capsule (0.4 mg total) by  mouth daily. Patient taking differently: Take 0.4 mg by mouth daily after breakfast.  07/14/15  Yes Geoffery Lyons, MD  traZODone (DESYREL) 150 MG tablet Take 75 mg by mouth at bedtime.    Yes Historical Provider, MD    ROS:  Out of a complete 14 system review of symptoms, the patient complains only of the following symptoms, and all other reviewed systems are negative.  Walking difficulty  Blood pressure 98/70, pulse (!) 51, height 6' 1.5" (1.867 m), weight 194 lb (88 kg).  Physical Exam  General: The patient is alert  and cooperative at the time of the examination.  Skin: No significant peripheral edema is noted.   Neurologic Exam  Mental status: The patient is alert and oriented x 3 at the time of the examination. The patient has apparent normal recent and remote memory, with an apparently normal attention span and concentration ability.   Cranial nerves: Facial symmetry is present. Speech is normal, no aphasia or dysarthria is noted. Extraocular movements are full. Visual fields are full. Pupils are equal, round, and reactive to light. Discs are flat bilaterally.  Motor: The patient has good strength in all 4 extremities.  Sensory examination: Soft touch sensation is symmetric on the face, arms, and legs.  Coordination: The patient has good finger-nose-finger and heel-to-shin bilaterally.  Gait and station: The patient has a slightly wide-based, diplegic gait, the patient walks with a walker. No drift is seen.  Reflexes: Deep tendon reflexes are symmetric.   MRI brain results  04/23/16:  IMPRESSION: This is an abnormal MRI of the brain with and without contrast showing the following: 1. There is extensive T2/FLAIR hyperintense signal change in both hemispheres with some foci also noted in the brainstem. Associated with this is mild generalized cortical atrophy and corpus callosal atrophy. Although nonspecific, this could be seen with chronic microvascular ischemic change, cerebral palsy or longstanding demyelinating disease. Due to the widespread nature of the changes, genetic leukodystrophies need to also be considered. 2. There are no acute findings and there is a normal enhancement pattern.  * MRI scan images were reviewed online. I agree with the written report.    Assessment/Plan:  1. 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 The  patient will undergo a visual evoked response test, further blood work will be done today, the patient will be considered for treatment with Ocrevus. The patient will follow-up in 4 months. A prescription was given for AFO braces.  Marlan Palau MD 05/19/2016 1:02 PM  [facility] [address] [place], [place] [address]  Phone [phone] Fax [phone]

## 2016-05-22 LAB — COMPREHENSIVE METABOLIC PANEL
A/G RATIO: 1.7 (ref 1.2–2.2)
ALT: 9 IU/L (ref 0–44)
AST: 14 IU/L (ref 0–40)
Albumin: 4.7 g/dL (ref 3.5–5.5)
Alkaline Phosphatase: 49 IU/L (ref 39–117)
BILIRUBIN TOTAL: 0.6 mg/dL (ref 0.0–1.2)
BUN/Creatinine Ratio: 11 (ref 9–20)
BUN: 14 mg/dL (ref 6–24)
CHLORIDE: 102 mmol/L (ref 96–106)
CO2: 25 mmol/L (ref 18–29)
Calcium: 10 mg/dL (ref 8.7–10.2)
Creatinine, Ser: 1.31 mg/dL — ABNORMAL HIGH (ref 0.76–1.27)
GFR calc non Af Amer: 62 mL/min/{1.73_m2} (ref >59)
GFR, EST AFRICAN AMERICAN: 72 mL/min/{1.73_m2} (ref >59)
GLOBULIN, TOTAL: 2.7 g/dL (ref 1.5–4.5)
Glucose: 97 mg/dL (ref 65–99)
POTASSIUM: 4.3 mmol/L (ref 3.5–5.2)
SODIUM: 141 mmol/L (ref 134–144)
TOTAL PROTEIN: 7.4 g/dL (ref 6.0–8.5)

## 2016-05-22 LAB — CBC WITH DIFFERENTIAL/PLATELET
BASOS ABS: 0 10*3/uL (ref 0.0–0.2)
Basos: 1 %
EOS (ABSOLUTE): 0.1 10*3/uL (ref 0.0–0.4)
Eos: 2 %
Hematocrit: 40.4 % (ref 37.5–51.0)
Hemoglobin: 13.7 g/dL (ref 13.0–17.7)
Immature Grans (Abs): 0 10*3/uL (ref 0.0–0.1)
Immature Granulocytes: 0 %
LYMPHS ABS: 2.5 10*3/uL (ref 0.7–3.1)
LYMPHS: 55 %
MCH: 30.8 pg (ref 26.6–33.0)
MCHC: 33.9 g/dL (ref 31.5–35.7)
MCV: 91 fL (ref 79–97)
MONOS ABS: 0.3 10*3/uL (ref 0.1–0.9)
Monocytes: 7 %
NEUTROS ABS: 1.6 10*3/uL (ref 1.4–7.0)
Neutrophils: 35 %
PLATELETS: 286 10*3/uL (ref 150–379)
RBC: 4.45 x10E6/uL (ref 4.14–5.80)
RDW: 13.9 % (ref 12.3–15.4)
WBC: 4.6 10*3/uL (ref 3.4–10.8)

## 2016-05-22 LAB — QUANTIFERON IN TUBE
QUANTIFERON MITOGEN VALUE: >10 IU/mL
QUANTIFERON NIL VALUE: 0.2 [IU]/mL
QUANTIFERON TB AG VALUE: 0.14 [IU]/mL
QUANTIFERON TB GOLD: NEGATIVE

## 2016-05-22 LAB — HEPATITIS C ANTIBODY: HEP C VIRUS AB: 1 {s_co_ratio} — AB (ref 0.0–0.9)

## 2016-05-22 LAB — HEPATITIS B SURFACE ANTIGEN: Hepatitis B Surface Ag: NEGATIVE

## 2016-05-22 LAB — HEPATITIS B CORE ANTIBODY, TOTAL: HEP B C TOTAL AB: NEGATIVE

## 2016-05-22 LAB — QUANTIFERON TB GOLD ASSAY (BLOOD)

## 2016-05-22 LAB — VARICELLA ZOSTER ANTIBODY, IGG: VARICELLA: 403 {index} (ref >165)

## 2016-05-27 ENCOUNTER — Ambulatory Visit: Payer: Medicaid Other | Attending: Adult Health | Admitting: Physical Therapy

## 2016-05-27 DIAGNOSIS — R2689 Other abnormalities of gait and mobility: Secondary | ICD-10-CM | POA: Diagnosis present
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
## 2016-05-29 ENCOUNTER — Telehealth: Payer: Self-pay | Admitting: Neurology

## 2016-05-29 DIAGNOSIS — Z5181 Encounter for therapeutic drug level monitoring: Secondary | ICD-10-CM

## 2016-05-29 NOTE — Telephone Encounter (Signed)
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

## 2016-06-04 ENCOUNTER — Encounter: Payer: Self-pay | Admitting: Adult Health

## 2016-06-05 ENCOUNTER — Encounter (INDEPENDENT_AMBULATORY_CARE_PROVIDER_SITE_OTHER): Payer: Medicaid Other

## 2016-06-17 ENCOUNTER — Other Ambulatory Visit: Payer: Self-pay | Admitting: Neurology

## 2016-06-17 ENCOUNTER — Telehealth: Payer: Self-pay | Admitting: Neurology

## 2016-06-17 ENCOUNTER — Ambulatory Visit: Payer: Medicaid Other | Attending: Adult Health | Admitting: Physical Therapy

## 2016-06-17 DIAGNOSIS — M21371 Foot drop, right foot: Secondary | ICD-10-CM

## 2016-06-17 DIAGNOSIS — R2689 Other abnormalities of gait and mobility: Secondary | ICD-10-CM

## 2016-06-17 DIAGNOSIS — M21372 Foot drop, left foot: Principal | ICD-10-CM

## 2016-06-17 NOTE — Telephone Encounter (Signed)
I called patient. For some reason he called concerned about side effects on Ocrevus, he has not even signed a form to get the medication started yet. He will be in the office on February 26  To sign the form.  I indicated to him that this is the only medication for primary progressive multiple sclerosis that is available, there are no other options.

## 2016-06-17 NOTE — Telephone Encounter (Signed)
Dr Willis- please advise 

## 2016-06-17 NOTE — Therapy (Signed)
[facility] [address] [place], [place], [address] Phone: [phone]   Fax:  [phone]  Patient Details  Name: [name] MRN: [id number] Date of Birth: 1965-11-05 Referring Provider: York Spaniel, MD  Encounter Date: 06/17/2016  Mr. Mercure saw Molly Maduro Reita Cliche) [place], [facility] at [facility] on Wednesday, 06/10/2016 for assessment for AFOs. [employer] is working on getting documentation to support Bil. AFOs from Dr. York Spaniel and submit to [facility].  For maximal benefits to his PT care, PT is holding his 3 visits approved by Medicaid until Mr. Turso has AFOs. He ambulates with foot drop, supination / eversion of ankle and knee hyperextension with Left > Right. Patient would benefit from dynamic response Carbon AFOs with medial struts and posterior plate / calf support due noted deviations.  PT plan is to see pt 1x/week for 3 weeks after he receives the AFOs.  Vladimir Faster PT, DPT 06/17/2016, 1:53 PM  [facility] [address] [place], [place], [address] Phone: [phone]   Fax:  [phone]

## 2016-06-17 NOTE — Telephone Encounter (Signed)
Pt would like a call back from nurse regardig medication MS. He is concerned over the side effects and would like something else prescribed. [phone]

## 2016-06-19 ENCOUNTER — Ambulatory Visit: Payer: Medicaid Other | Admitting: Physical Therapy

## 2016-06-22 ENCOUNTER — Encounter (INDEPENDENT_AMBULATORY_CARE_PROVIDER_SITE_OTHER): Payer: Self-pay

## 2016-06-22 ENCOUNTER — Ambulatory Visit: Payer: Medicaid Other | Admitting: Physical Therapy

## 2016-06-22 DIAGNOSIS — Z0289 Encounter for other administrative examinations: Secondary | ICD-10-CM

## 2016-06-22 NOTE — Telephone Encounter (Signed)
Patient stopped in office today. He signed forms for ocrevus. Spoke with Lupe Carney, RN and he had previously signed forms in January. She stated approval still in process.

## 2016-06-25 ENCOUNTER — Encounter: Payer: Self-pay | Admitting: *Deleted

## 2016-06-25 ENCOUNTER — Telehealth: Payer: Self-pay | Admitting: *Deleted

## 2016-06-25 MED ORDER — OCRELIZUMAB 300 MG/10ML IV SOLN: INTRAVENOUS | 0 refills | Status: DC

## 2016-06-25 NOTE — Progress Notes (Signed)
Faxed signed orders back to [facility] for need for bilateral AFOS to improve ambulation because of bilateral foot drop.  (Carbon sprial AFO) Fax: [phone]. Received confirmation.

## 2016-06-25 NOTE — Telephone Encounter (Signed)
Rx. faxed to [facility]/fim 

## 2016-06-30 MED FILL — OCREVUS 300 MG/10ML SOLN: 300 | 15 days supply | Qty: 20 | Fill #0

## 2016-07-01 NOTE — Telephone Encounter (Signed)
Teacher, music Scientist, research (physical sciences)) at [employer]. Advised PA ocrevus approved. She verbalized understanding and stated they were able to run the medication through yesterday and they are already working on it.

## 2016-07-01 NOTE — Telephone Encounter (Signed)
Called [employer]. PA ocrevus approved.  WU#[id number]. Effective from 06/30/16-06/25/17.

## 2016-07-08 ENCOUNTER — Telehealth: Payer: Self-pay | Admitting: Neurology

## 2016-07-08 ENCOUNTER — Ambulatory Visit (INDEPENDENT_AMBULATORY_CARE_PROVIDER_SITE_OTHER): Payer: Medicaid Other

## 2016-07-08 DIAGNOSIS — R9089 Other abnormal findings on diagnostic imaging of central nervous system: Secondary | ICD-10-CM

## 2016-07-08 DIAGNOSIS — G35 Multiple sclerosis: Secondary | ICD-10-CM | POA: Diagnosis not present

## 2016-07-08 NOTE — Telephone Encounter (Signed)
I called the patient. The VER is abnormal bilaterally. I think that this helps solidify the diagnosis of multiple sclerosis, the patient will be starting on Ocrevus in the near future.

## 2016-07-08 NOTE — Procedures (Signed)
     History: [name] is a 51 year old gentleman with a history of multiple sclerosis associated with extensive white matter changes, and abnormal spinal fluid evaluation with oligoclonal banding. The patient is being evaluated for optic nerve dysfunction.  Description: The visual evoked response test was performed bilaterally using 32 x 32 check sizes. Visual acuity on the left is 20/50 uncorrected, and is 20/40 uncorrected on the right. The absolute latencies for the P100 wave forms were prolonged bilaterally as were the N1 latencies bilaterally. The amplitudes for the P100 wave forms were within normal limits bilaterally, and relatively are symmetric from one side to the next.  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

## 2016-07-13 ENCOUNTER — Telehealth: Payer: Self-pay | Admitting: Neurology

## 2016-07-13 NOTE — Telephone Encounter (Signed)
Patient called office notified patient of nurse's message of the infusion information.  Patient voiced understanding and will contact the number I gave him for [facility].

## 2016-07-13 NOTE — Telephone Encounter (Addendum)
Called pt. He wanted me to hold on a minute while he stepped away from what he was doing. Call got disconnected. I tried calling back, went to VM.   Patient receiving Ocrevus infusions via [facility]. He gets next infusion 2 weeks after initial infusion. He can call [facility] at [phone]

## 2016-07-13 NOTE — Telephone Encounter (Signed)
Mary/[facility] [phone] called to advise the pt called her wanting to where his next infusion is going to be. Please call the pt and advise

## 2016-07-13 NOTE — Telephone Encounter (Signed)
Noted, thank you

## 2016-07-14 ENCOUNTER — Encounter (HOSPITAL_COMMUNITY)
Admission: RE | Admit: 2016-07-14 | Discharge: 2016-07-14 | Disposition: A | Discharge status: Home / Self Care | Payer: Medicaid Other | Source: Ambulatory Visit | Attending: Neurology | Admitting: Neurology

## 2016-07-14 ENCOUNTER — Encounter (HOSPITAL_COMMUNITY): Payer: Self-pay

## 2016-07-14 ENCOUNTER — Telehealth: Payer: Self-pay | Admitting: *Deleted

## 2016-07-14 DIAGNOSIS — G35 Multiple sclerosis: Secondary | ICD-10-CM | POA: Diagnosis not present

## 2016-07-14 MED ORDER — DIPHENHYDRAMINE HCL 25 MG PO TABS
50.0000 mg | ORAL_TABLET | Freq: Once | ORAL | Status: DC
  Filled 2016-07-14: qty 2

## 2016-07-14 MED ORDER — SODIUM CHLORIDE 0.9 % IV SOLN
Freq: Once | INTRAVENOUS | Status: AC
  Administered 2016-07-14: 10:00:00 via INTRAVENOUS

## 2016-07-14 MED ORDER — DIPHENHYDRAMINE HCL 25 MG PO CAPS
50.0000 mg | ORAL_CAPSULE | Freq: Once | ORAL | Status: AC
  Administered 2016-07-14: 50 mg via ORAL
  Filled 2016-07-14: qty 2

## 2016-07-14 MED ORDER — SODIUM CHLORIDE 0.9 % IV SOLN
300.0000 mg | Freq: Once | INTRAVENOUS | Status: AC
  Administered 2016-07-14: 300 mg via INTRAVENOUS
  Filled 2016-07-14: qty 10

## 2016-07-14 MED ORDER — SODIUM CHLORIDE 0.9 % IV SOLN
500.0000 mg | Freq: Once | INTRAVENOUS | Status: AC
  Administered 2016-07-14: 500 mg via INTRAVENOUS
  Filled 2016-07-14: qty 4

## 2016-07-14 NOTE — Telephone Encounter (Signed)
Called and spoke with pt. Advised [name] calling wants to know if he can be there within the next hour for first ocrevus infusion. Pt stated he was at the cancer center. Advised this was wrong location. Advised him to ask someone to direct him to admitting through the main entrance. He was able to find his way. He will call if he needs further help.

## 2016-07-14 NOTE — Discharge Instructions (Signed)
Ocrelizumab injection What is this medicine? OCRELIZUMAB (ok re LIZ ue mab) treats multiple sclerosis. It helps to decrease the number of multiple sclerosis relapses. It is not a cure. This medicine may be used for other purposes; ask your health care provider or pharmacist if you have questions. COMMON BRAND NAME(S): OCREVUS What should I tell my health care provider before I take this medicine? They need to know if you have any of these conditions: -cancer -hepatitis B infection -other infection (especially a virus infection such as chickenpox, cold sores, or herpes) -an unusual or allergic reaction to ocrelizumab, other medicines, foods, dyes or preservatives -pregnant or trying to get pregnant -breast-feeding How should I use this medicine? This medicine is for infusion into a vein. It is given by a health care professional in a hospital or clinic setting. Talk to your pediatrician regarding the use of this medicine in children. Special care may be needed. Overdosage: If you think you have taken too much of this medicine contact a poison control center or emergency room at once. NOTE: This medicine is only for you. Do not share this medicine with others. What if I miss a dose? Keep appointments for follow-up doses as directed. It is important not to miss your dose. Call your doctor or health care professional if you are unable to keep an appointment. What may interact with this medicine? -alemtuzumab -daclizumab -dimethyl fumarate -fingolimod -glatiramer -interferon beta -live virus vaccines -mitoxantrone -natalizumab -peginterferon beta -rituximab -steroid medicines like prednisone or cortisone -teriflunomide This list may not describe all possible interactions. Give your health care provider a list of all the medicines, herbs, non-prescription drugs, or dietary supplements you use. Also tell them if you smoke, drink alcohol, or use illegal drugs. Some items may interact with  your medicine. What should I watch for while using this medicine? Tell your doctor or healthcare professional if your symptoms do not start to get better or if they get worse. This medicine can cause serious allergic reactions. To reduce your risk you may need to take medicine before treatment with this medicine. Take your medicine as directed. Women should inform their doctor if they wish to become pregnant or think they might be pregnant. There is a potential for serious side effects to an unborn child. Talk to your health care professional or pharmacist for more information. Male patients should use effective birth control methods while receiving this medicine and for 6 months after the last dose. Call your doctor or health care professional for advice if you get a fever, chills or sore throat, or other symptoms of a cold or flu. Do not treat yourself. This drug decreases your body's ability to fight infections. Try to avoid being around people who are sick. If you have a hepatitis B infection or a history of a hepatitis B infection, talk to your doctor. The symptoms of hepatitis B may get worse if you take this medicine. In some patients, this medicine may cause a serious brain infection that may cause death. If you have any problems seeing, thinking, speaking, walking, or standing, tell your doctor right away. If you cannot reach your doctor, urgently seek other source of medical care. This medicine can decrease the response to a vaccine. If you need to get vaccinated, tell your healthcare professional if you have received this medicine. Extra booster doses may be needed. Talk to your doctor to see if a different vaccination schedule is needed. Talk to your doctor about your risk of cancer.   You may be more at risk for certain types of cancers if you take this medicine. What side effects may I notice from receiving this medicine? Side effects that you should report to your doctor or health care  professional as soon as possible: -allergic reactions like skin rash, itching or hives, swelling of the face, lips, or tongue -breathing problems -facial flushing -fast, irregular heartbeat -lump or soreness in the breast -signs and symptoms of herpes such as cold sore, shingles, or genital sores -signs and symptoms of infection like fever or chills, cough, sore throat, pain or trouble passing urine -signs and symptoms of low blood pressure like dizziness; feeling faint or lightheaded, falls; unusually weak or tired -signs and symptoms of progressive multifocal leukoencephalopathy (PML) like changes in vision; clumsiness; confusion; personality changes; weakness on one side of the body -swelling of the ankles, feet, hands Side effects that usually do not require medical attention (report these to your doctor or health care professional if they continue or are bothersome): -back pain -depressed mood -diarrhea -pain, redness, or irritation at site where injected This list may not describe all possible side effects. Call your doctor for medical advice about side effects. You may report side effects to FDA at 1-800-FDA-1088. Where should I keep my medicine? This drug is given in a hospital or clinic and will not be stored at home. NOTE: This sheet is a summary. It may not cover all possible information. If you have questions about this medicine, talk to your doctor, pharmacist, or health care provider.  2018 Elsevier/Gold Standard (2015-07-30 09:40:25)  

## 2016-07-14 NOTE — Progress Notes (Signed)
Patient tolerated first dose of Ocrevus well. Aware of d/c instructions and next appointment.

## 2016-07-16 ENCOUNTER — Encounter: Payer: Self-pay | Admitting: Physical Therapy

## 2016-07-16 ENCOUNTER — Ambulatory Visit: Payer: Medicaid Other | Attending: Adult Health | Admitting: Physical Therapy

## 2016-07-16 DIAGNOSIS — R2689 Other abnormalities of gait and mobility: Secondary | ICD-10-CM | POA: Diagnosis not present
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
## 2016-07-28 ENCOUNTER — Encounter (HOSPITAL_COMMUNITY)
Admission: RE | Admit: 2016-07-28 | Discharge: 2016-07-28 | Disposition: A | Discharge status: Home / Self Care | Payer: Medicaid Other | Source: Ambulatory Visit | Attending: Neurology | Admitting: Neurology

## 2016-07-28 ENCOUNTER — Encounter (HOSPITAL_COMMUNITY): Payer: Self-pay

## 2016-07-28 DIAGNOSIS — G35 Multiple sclerosis: Secondary | ICD-10-CM | POA: Diagnosis present

## 2016-07-28 MED ORDER — SODIUM CHLORIDE 0.9 % IV SOLN
INTRAVENOUS | Status: DC
  Administered 2016-07-28: 08:00:00 via INTRAVENOUS

## 2016-07-28 MED ORDER — OCRELIZUMAB 300 MG/10ML IV SOLN
300.0000 mg | Freq: Once | INTRAVENOUS | Status: AC
  Administered 2016-07-28: 300 mg via INTRAVENOUS
  Filled 2016-07-28: qty 10

## 2016-07-28 MED ORDER — SODIUM CHLORIDE 0.9 % IV SOLN
500.0000 mg | Freq: Once | INTRAVENOUS | Status: AC
  Administered 2016-07-28: 500 mg via INTRAVENOUS
  Filled 2016-07-28: qty 4

## 2016-07-28 MED ORDER — DIPHENHYDRAMINE HCL 25 MG PO CAPS
50.0000 mg | ORAL_CAPSULE | Freq: Once | ORAL | Status: AC
  Administered 2016-07-28: 50 mg via ORAL
  Filled 2016-07-28: qty 2

## 2016-07-28 NOTE — Progress Notes (Signed)
  July 28, 2016  Patient: [name]  Date of Birth: 09/05/1965  Date of Visit: 07/28/2016 0730 - 1315    To Whom It May Concern:  [name] was seen and treated in our short stay department or on 07/28/2016. [name] may return to class/ work upon discharge @ 1315.  Sincerely,  Burnell Blanks, RN

## 2016-07-28 NOTE — Discharge Instructions (Signed)
Ocrelizumab injection What is this medicine? OCRELIZUMAB (ok re LIZ ue mab) treats multiple sclerosis. It helps to decrease the number of multiple sclerosis relapses. It is not a cure. This medicine may be used for other purposes; ask your health care provider or pharmacist if you have questions. COMMON BRAND NAME(S): OCREVUS What should I tell my health care provider before I take this medicine? They need to know if you have any of these conditions: -cancer -hepatitis B infection -other infection (especially a virus infection such as chickenpox, cold sores, or herpes) -an unusual or allergic reaction to ocrelizumab, other medicines, foods, dyes or preservatives -pregnant or trying to get pregnant -breast-feeding How should I use this medicine? This medicine is for infusion into a vein. It is given by a health care professional in a hospital or clinic setting. Talk to your pediatrician regarding the use of this medicine in children. Special care may be needed. Overdosage: If you think you have taken too much of this medicine contact a poison control center or emergency room at once. NOTE: This medicine is only for you. Do not share this medicine with others. What if I miss a dose? Keep appointments for follow-up doses as directed. It is important not to miss your dose. Call your doctor or health care professional if you are unable to keep an appointment. What may interact with this medicine? -alemtuzumab -daclizumab -dimethyl fumarate -fingolimod -glatiramer -interferon beta -live virus vaccines -mitoxantrone -natalizumab -peginterferon beta -rituximab -steroid medicines like prednisone or cortisone -teriflunomide This list may not describe all possible interactions. Give your health care provider a list of all the medicines, herbs, non-prescription drugs, or dietary supplements you use. Also tell them if you smoke, drink alcohol, or use illegal drugs. Some items may interact with  your medicine. What should I watch for while using this medicine? Tell your doctor or healthcare professional if your symptoms do not start to get better or if they get worse. This medicine can cause serious allergic reactions. To reduce your risk you may need to take medicine before treatment with this medicine. Take your medicine as directed. Women should inform their doctor if they wish to become pregnant or think they might be pregnant. There is a potential for serious side effects to an unborn child. Talk to your health care professional or pharmacist for more information. Male patients should use effective birth control methods while receiving this medicine and for 6 months after the last dose. Call your doctor or health care professional for advice if you get a fever, chills or sore throat, or other symptoms of a cold or flu. Do not treat yourself. This drug decreases your body's ability to fight infections. Try to avoid being around people who are sick. If you have a hepatitis B infection or a history of a hepatitis B infection, talk to your doctor. The symptoms of hepatitis B may get worse if you take this medicine. In some patients, this medicine may cause a serious brain infection that may cause death. If you have any problems seeing, thinking, speaking, walking, or standing, tell your doctor right away. If you cannot reach your doctor, urgently seek other source of medical care. This medicine can decrease the response to a vaccine. If you need to get vaccinated, tell your healthcare professional if you have received this medicine. Extra booster doses may be needed. Talk to your doctor to see if a different vaccination schedule is needed. Talk to your doctor about your risk of cancer.   You may be more at risk for certain types of cancers if you take this medicine. What side effects may I notice from receiving this medicine? Side effects that you should report to your doctor or health care  professional as soon as possible: -allergic reactions like skin rash, itching or hives, swelling of the face, lips, or tongue -breathing problems -facial flushing -fast, irregular heartbeat -lump or soreness in the breast -signs and symptoms of herpes such as cold sore, shingles, or genital sores -signs and symptoms of infection like fever or chills, cough, sore throat, pain or trouble passing urine -signs and symptoms of low blood pressure like dizziness; feeling faint or lightheaded, falls; unusually weak or tired -signs and symptoms of progressive multifocal leukoencephalopathy (PML) like changes in vision; clumsiness; confusion; personality changes; weakness on one side of the body -swelling of the ankles, feet, hands Side effects that usually do not require medical attention (report these to your doctor or health care professional if they continue or are bothersome): -back pain -depressed mood -diarrhea -pain, redness, or irritation at site where injected This list may not describe all possible side effects. Call your doctor for medical advice about side effects. You may report side effects to FDA at 1-800-FDA-1088. Where should I keep my medicine? This drug is given in a hospital or clinic and will not be stored at home. NOTE: This sheet is a summary. It may not cover all possible information. If you have questions about this medicine, talk to your doctor, pharmacist, or health care provider.  2018 Elsevier/Gold Standard (2015-07-30 09:40:25)  Multiple Sclerosis Multiple sclerosis (MS) is a disease of the central nervous system. It leads to the loss of the insulating covering of the nerves (myelin sheath) of your brain. When this happens, brain signals do not get sent properly or may not get sent at all. The age of onset of MS varies. What are the causes? The cause of MS is unknown. However, it is more common in the [place] than in the [place]. What  increases the risk? There is a higher number of women with MS than men. MS is not an illness that is passed down to you from your family members (inherited). However, your risk of MS is higher if you have a relative with MS. What are the signs or symptoms? The symptoms of MS occur in episodes or attacks. These attacks may last weeks to months. There may be long periods of almost no symptoms between attacks. The symptoms of MS vary. This is because of the many different ways it affects the central nervous system. The main symptoms of MS include:  Vision problems and eye pain.  Numbness.  Weakness.  Inability to move your arms, hands, feet, or legs (paralysis).  Balance problems.  Tremors. How is this diagnosed? Your health care provider can diagnose MS with the help of imaging exams and lab tests. These may include specialized X-ray exams and spinal fluid tests. The best imaging exam to confirm a diagnosis of MS is an MRI. How is this treated? There is no known cure for MS, but there are medicines that can decrease the number and frequency of attacks. Steroids are often used for short-term relief. Physical and occupational therapy may also help. There are also many new alternative or complementary treatments available to help control the symptoms of MS. Ask your health care provider if any of these other options are right for you. Follow these instructions at home:  Take medicines as directed by your health care provider.  Exercise as directed by your health care provider. Contact a health care provider if: You begin to feel depressed. Get help right away if:  You develop paralysis.  You have problems with bladder, bowel, or sexual function.  You develop mental changes, such as forgetfulness or mood swings.  You have a period of uncontrolled movements (seizure). This information is not intended to replace advice given to you by your health care provider. Make sure you discuss any  questions you have with your health care provider. Document Released: 04/10/2000 Document Revised: 09/19/2015 Document Reviewed: 12/19/2012 Elsevier Interactive Patient Education  2017 [employer].

## 2016-07-29 ENCOUNTER — Encounter: Payer: Self-pay | Admitting: Physical Therapy

## 2016-07-29 ENCOUNTER — Ambulatory Visit: Payer: Medicaid Other | Attending: Adult Health | Admitting: Physical Therapy

## 2016-07-29 DIAGNOSIS — M21371 Foot drop, right foot: Secondary | ICD-10-CM | POA: Diagnosis present

## 2016-07-29 DIAGNOSIS — M21372 Foot drop, left foot: Secondary | ICD-10-CM | POA: Insufficient documentation

## 2016-07-29 DIAGNOSIS — R2689 Other abnormalities of gait and mobility: Secondary | ICD-10-CM | POA: Diagnosis not present
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
## 2016-07-30 ENCOUNTER — Ambulatory Visit: Payer: Medicaid Other | Admitting: Adult Health

## 2016-08-12 ENCOUNTER — Encounter: Payer: Self-pay | Admitting: Physical Therapy

## 2016-08-12 ENCOUNTER — Ambulatory Visit: Payer: Medicaid Other | Admitting: Physical Therapy

## 2016-08-12 DIAGNOSIS — M21372 Foot drop, left foot: Secondary | ICD-10-CM

## 2016-08-12 DIAGNOSIS — M21371 Foot drop, right foot: Secondary | ICD-10-CM

## 2016-08-12 DIAGNOSIS — R2689 Other abnormalities of gait and mobility: Secondary | ICD-10-CM

## 2016-08-12 NOTE — Patient Instructions (Signed)
Chair Sitting    Sit at edge of seat, spine straight, one leg extended. Put a hand on each thigh and bend forward from the hip, keeping spine straight. Allow hand on extended leg to reach toward toes. Support upper body with other arm. Hold _15__ seconds. Repeat _5Sitting Balance (Very Advanced)    With feet flat, turn upper body as far as possible toward one side and then toward other side. Work to keep balance. Repeat __10__ times on BOTH sides. Do __1__ session per day.Hip Flexor Stretch    Lying on back near edge of bed, bend one leg, foot flat. Hang other leg over edge, relaxed, thigh resting entirely on bed for __2__ minutes. Repeat __2__ times. Do __1__ sessions per day. Advanced Exercise: Bend knee back keeping thigh in contact with bed.  http://gt2.exer.us/346   Copyright  VHI. All rights reserved.    http://gt2.exer.us/699   Copyright  VHI. All rights reserved.  _ times per session. Do _1Gastroc / Heel Cord Stretch - Seated With Towel    Sit on floor, towel around ball of foot. Gently pull foot in toward body, stretching heel cord and calf. Hold for _15__ seconds. Repeat on involved leg. Repeat _5__ times. Do _1__ times per day.  Copyright  VHI. All rights reserved.  __ sessions per day.  Copyright  VHI. All rights reserved.
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
## 2016-08-27 ENCOUNTER — Other Ambulatory Visit: Payer: Self-pay | Admitting: Adult Health

## 2016-09-15 ENCOUNTER — Ambulatory Visit: Payer: Medicaid Other | Admitting: Adult Health

## 2016-10-12 ENCOUNTER — Telehealth: Payer: Self-pay | Admitting: Neurology

## 2016-10-12 NOTE — Telephone Encounter (Signed)
Dr Anne Hahn- Is this something you can do?

## 2016-10-12 NOTE — Telephone Encounter (Signed)
There may be insurance coverage for specially made diabetic shoes, I'm not aware of any special funding for patients with multiple sclerosis to buy shoes. I'm afraid this gentleman will have to by his shoes just like everybody else.

## 2016-10-12 NOTE — Telephone Encounter (Signed)
Called and spoke to pt. He stated he was talking to friend about possibly getting approval for a larger size shoe d/t him having MS. Advised I am not aware of [place] being able to provide this service. We have provided rx for braces for feet. He states if he buys them, its going to be around $120.  He was wondering if we can get new shoes approved for him. Advised I will speak with Dr Anne Hahn to see if this is something we can help with and call back to let him know. He verbalized understanding.

## 2016-10-12 NOTE — Telephone Encounter (Signed)
Patient called office in reference to see if we are able to get medicaid to approve shoes for him due to having MS.  Patient is currently wearing a size 13, but will need a size 14.  I tried to gather more information from the patient as far as place to get the shoes and he wasn't sure.  Please call

## 2016-10-22 ENCOUNTER — Ambulatory Visit: Payer: Medicaid Other | Admitting: Adult Health

## 2016-12-17 ENCOUNTER — Encounter: Payer: Self-pay | Admitting: Adult Health

## 2016-12-17 ENCOUNTER — Ambulatory Visit (INDEPENDENT_AMBULATORY_CARE_PROVIDER_SITE_OTHER): Payer: Medicaid Other | Admitting: Adult Health

## 2016-12-17 VITALS — BP 103/62 | HR 61 | Wt 190.4 lb

## 2016-12-17 DIAGNOSIS — G35 Multiple sclerosis: Secondary | ICD-10-CM

## 2016-12-17 DIAGNOSIS — R269 Unspecified abnormalities of gait and mobility: Secondary | ICD-10-CM

## 2016-12-17 DIAGNOSIS — Z5181 Encounter for therapeutic drug level monitoring: Secondary | ICD-10-CM

## 2016-12-17 NOTE — Progress Notes (Signed)
PATIENT: [name] DOB: Aug 26, 1965  REASON FOR VISIT: follow up-  sclerosis HISTORY FROM: patient  HISTORY OF PRESENT ILLNESS: [name] is a 51 year old male with a history of multiple sclerosis. He returns today for follow-up. At the last visit a visual evoked response test was positive. In the past lumbar puncture has been positive for oligoclonal bands. The patient was started on Ocrevus. He had his first infusion and second infusion  will be October. Overall the patient states that he is well. Denies any new symptoms. Denies numbness or weakness. Denies any changes with his gait or balance. Patient reports that he primarily uses a motorized wheelchair. He states that he will use a walker for short distances and transfers primarily uses the wheelchair. Denies any changes in bowels or bladder. No changes in vision. The patient does report that he was recently diagnosed with herpes. He reports that he has undergone treatment and is no longer on medication. He returns today for an evaluation  HISTORY [name] is a 51 year old right-handed black male with a history of a progressive spastic quadriparesis. The patient is walking with a walker, he has not had any falls since last seen. The patient has undergone MRI evaluation of the cervical spine and thoracic spine that did not show evidence of spinal cord compression or spinal cord lesions. MRI of the brain has shown extensive white matter changes. The patient does have a history of high blood pressure, but his blood pressures have never been extremely elevated. His history is complicated by the fact that he has a prior history of crack cocaine abuse. The patient was sent for lumbar puncture, the studies were unremarkable with exception that the patient had greater than 5 oligoclonal bands in the spinal fluid suggestive of demyelinating disease such as multiple sclerosis. A visual evoked response test has been ordered, this has not yet been  done. The patient returns to the office today for an evaluation. He has been set up for physical therapy, this has not yet taken place. The patient himself denies any memory issues, he denies any change in control of the bowels or the bladder.  REVIEW OF SYSTEMS: Out of a complete 14 system review of symptoms, the patient complains only of the following symptoms, and all other reviewed systems are negative.  See history of present illness  ALLERGIES: Allergies  Allergen Reactions  . Morphine And Related Hives    HOME MEDICATIONS: Outpatient Medications Prior to Visit  Medication Sig Dispense Refill  . baclofen (LIORESAL) 20 MG tablet TAKE ONE TABLET BY MOUTH THREE TIMES A DAY. 90 tablet 5  . FLUoxetine (PROZAC) 40 MG capsule Take 40 mg by mouth every morning.    Marland Kitchen lisinopril-hydrochlorothiazide (PRINZIDE,ZESTORETIC) 20-12.5 MG tablet Take 1 tablet by mouth every morning.     . montelukast (SINGULAIR) 10 MG tablet Take 10 mg by mouth every morning.     Marland Kitchen ocrelizumab in sodium chloride 0.9 % 500 mL 300mg  IV on day 1 and repeat 300mg  IV on day 15 2 each 0  . tamsulosin (FLOMAX) 0.4 MG CAPS capsule Take 1 capsule (0.4 mg total) by mouth daily. (Patient taking differently: Take 0.4 mg by mouth daily after breakfast. ) 30 capsule 1  . traZODone (DESYREL) 150 MG tablet Take 75 mg by mouth at bedtime.      No facility-administered medications prior to visit.     PAST MEDICAL HISTORY: Past Medical History:  Diagnosis Date  . Abnormality of gait  11/28/2015  . BPH (benign prostatic hyperplasia)   . Depression   . Environmental and seasonal allergies   . History of hiatal hernia   . Hypertension   . 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 APPENDECTOMY    . CLOSED REDUCTION NASAL FRACTURE  10-08-2004   and Septoplasty  . HERNIA REPAIR     pt states has  had hiatal hernia repair   . left ankle surgery     . LUMBAR DISC SURGERY  11-02-2000   L4 -- L5  . ORIF RIGHT PROXIMAL HUMERUS FX AND ORIF RIGHT ANKLE FX  01-27-2007  . RIGHT SHOULDER MANIPULATION WITH HARDWARE REMOVAL/  RIGHT TIBIA NONUNION SITE  HARDWARE REMOVAL AND EXCHANGED AND BONE GRAFT  AND ALLOGRAFT  05-10-2007  . TRANSURETHRAL RESECTION OF PROSTATE N/A 10/28/2015   Procedure: TRANSURETHRAL RESECTION OF THE PROSTATE WITH GYRUS INSTRUMENTS;  Surgeon: Malen Gauze, MD;  Location: [facility];  Service: Urology;  Laterality: N/A;    FAMILY HISTORY: Family History  Problem Relation Age of Onset  . Colon cancer Mother     SOCIAL HISTORY: Social History   Social History  . Marital status: Divorced    Spouse name: N/A  . Number of children: 1  . Years of education: 37   Occupational History  . N/A    Social History Main Topics  . Smoking status: Current Every Day Smoker    Packs/day: 0.50    Years: 30.00    Types: Cigarettes  . Smokeless tobacco: Never Used  . Alcohol use Yes     Comment: Drinks beer occasionally  . Drug use: No     Comment:  cocaine use--  per pt last cocaine Feb 2017  . Sexual activity: Not on file   Other Topics Concern  . Not on file   Social History Narrative   Lives alone   Right-handed   Caffeine: 2 cups of coffee and 20 oz soda/day      PHYSICAL EXAM  Vitals:   12/17/16 1257  BP: 103/62  Pulse: 61  Weight: 190 lb 6.4 oz (86.4 kg)   Body mass index is 24.78 kg/m.  Generalized: Well developed, in no acute distress   Neurological examination  Mentation: Alert oriented to time, place, history taking. Follows all commands speech and language fluent Cranial nerve II-XII: Pupils were equal round reactive to light. Extraocular movements were full, visual field were full on confrontational test. Facial sensation and strength were normal. Uvula tongue midline. Head turning and shoulder shrug  were normal and symmetric. Motor: The  motor testing reveals 5 over 5 strength In the upper extremities. 4 out of 5 strength in the lower legs. He has bilateral AFO braces on. Sensory: Sensory testing is intact to soft touch on all 4 extremities. No evidence of extinction is noted.  Coordination: Cerebellar testing reveals good finger-nose-finger and heel-to-shin bilaterally.  Gait and station: Patient is in a motorized wheelchair Reflexes: Deep tendon reflexes are symmetric and normal bilaterally.   DIAGNOSTIC DATA (LABS, IMAGING, TESTING) - I reviewed patient records, labs, notes, testing and imaging myself where available.  Lab Results  Component Value Date   WBC 4.6 05/19/2016   HGB 13.7 05/19/2016   HCT 40.4 05/19/2016   MCV 91 05/19/2016   PLT 286 05/19/2016      Component Value Date/Time   NA 141 05/19/2016 1308  K 4.3 05/19/2016 1308   CL 102 05/19/2016 1308   CO2 25 05/19/2016 1308   GLUCOSE 97 05/19/2016 1308   GLUCOSE 141 (H) 10/29/2015 0517   BUN 14 05/19/2016 1308   CREATININE 1.31 (H) 05/19/2016 1308   CALCIUM 10.0 05/19/2016 1308   PROT 7.4 05/19/2016 1308   ALBUMIN 4.7 05/19/2016 1308   AST 14 05/19/2016 1308   ALT 9 05/19/2016 1308   ALKPHOS 49 05/19/2016 1308   BILITOT 0.6 05/19/2016 1308   GFRNONAA 62 05/19/2016 1308   GFRAA 72 05/19/2016 1308       ASSESSMENT AND PLAN 51 y.o. year old male  has a past medical history of Abnormality of gait (11/28/2015); BPH (benign prostatic hyperplasia); Depression; Environmental and seasonal allergies; History of hiatal hernia; Hypertension; Multiple sclerosis, primary progressive (HCC) (05/19/2016); and Spastic quadriparesis (HCC) (11/28/2015). here with:  1. Multiple sclerosis 2. Abnormality of gait  The patient will continue on Ocrevus. I will check blood work today. He will continue using baclofen for  muscle spasms. Advised that if his symptoms worsen or he develops new symptoms he should let us know. He will follow-up in 6 months or sooner if  needed.     Butch Penny, MSN, NP-C 12/17/2016, 1:07 PM [facility] [address], [address] [place], [place] [address] [phone]

## 2016-12-17 NOTE — Patient Instructions (Addendum)
Your Plan:  Continue Ocrevus Blood work today If your symptoms worsen or you develop new symptoms please let us know.    Thank you for coming to see us at [facility]. I hope we have been able to provide you high quality care today.  You may receive a patient satisfaction survey over the next few weeks. We would appreciate your feedback and comments so that we may continue to improve ourselves and the health of our patients.  

## 2016-12-17 NOTE — Progress Notes (Signed)
I have read the note, and I agree with the clinical assessment and plan.  Quentin Strebel KEITH   

## 2016-12-18 ENCOUNTER — Telehealth: Payer: Self-pay | Admitting: *Deleted

## 2016-12-18 LAB — CBC WITH DIFFERENTIAL/PLATELET
BASOS ABS: 0 10*3/uL (ref 0.0–0.2)
Basos: 1 %
EOS (ABSOLUTE): 0 10*3/uL (ref 0.0–0.4)
EOS: 1 %
HEMATOCRIT: 46.2 % (ref 37.5–51.0)
Hemoglobin: 15.7 g/dL (ref 13.0–17.7)
Immature Grans (Abs): 0 10*3/uL (ref 0.0–0.1)
Immature Granulocytes: 0 %
LYMPHS ABS: 1.4 10*3/uL (ref 0.7–3.1)
Lymphs: 34 %
MCH: 32.2 pg (ref 26.6–33.0)
MCHC: 34 g/dL (ref 31.5–35.7)
MCV: 95 fL (ref 79–97)
MONOS ABS: 0.3 10*3/uL (ref 0.1–0.9)
Monocytes: 8 %
Neutrophils Absolute: 2.4 10*3/uL (ref 1.4–7.0)
Neutrophils: 56 %
Platelets: 283 10*3/uL (ref 150–379)
RBC: 4.88 x10E6/uL (ref 4.14–5.80)
RDW: 14.8 % (ref 12.3–15.4)
WBC: 4.2 10*3/uL (ref 3.4–10.8)

## 2016-12-18 LAB — COMPREHENSIVE METABOLIC PANEL
A/G RATIO: 1.6 (ref 1.2–2.2)
ALBUMIN: 4.6 g/dL (ref 3.5–5.5)
ALK PHOS: 53 IU/L (ref 39–117)
ALT: 11 IU/L (ref 0–44)
AST: 18 IU/L (ref 0–40)
BILIRUBIN TOTAL: 0.4 mg/dL (ref 0.0–1.2)
BUN / CREAT RATIO: 14 (ref 9–20)
BUN: 17 mg/dL (ref 6–24)
CHLORIDE: 102 mmol/L (ref 96–106)
CO2: 25 mmol/L (ref 20–29)
Calcium: 10 mg/dL (ref 8.7–10.2)
Creatinine, Ser: 1.23 mg/dL (ref 0.76–1.27)
GFR calc Af Amer: 77 mL/min/{1.73_m2} (ref >59)
GFR calc non Af Amer: 67 mL/min/{1.73_m2} (ref >59)
GLOBULIN, TOTAL: 2.8 g/dL (ref 1.5–4.5)
Glucose: 87 mg/dL (ref 65–99)
POTASSIUM: 4.2 mmol/L (ref 3.5–5.2)
SODIUM: 140 mmol/L (ref 134–144)
Total Protein: 7.4 g/dL (ref 6.0–8.5)

## 2016-12-18 NOTE — Telephone Encounter (Signed)
-----   Message from Butch Penny, NP sent at 12/18/2016 11:25 AM EDT ----- Blood work unremarkable. Please call patient.

## 2016-12-21 NOTE — Telephone Encounter (Signed)
I spoke to pt and relayed that his lab results were unremarkable.  (normal range).  He verbalized understanding.

## 2017-01-18 ENCOUNTER — Other Ambulatory Visit: Payer: Self-pay | Admitting: Neurology

## 2017-01-18 MED FILL — OCREVUS 300 MG/10ML SOLN: 300 | 15 days supply | Qty: 20 | Fill #0

## 2017-01-19 ENCOUNTER — Other Ambulatory Visit: Payer: Self-pay | Admitting: Neurology

## 2017-01-19 DIAGNOSIS — G35 Multiple sclerosis: Secondary | ICD-10-CM

## 2017-01-27 ENCOUNTER — Encounter (HOSPITAL_COMMUNITY): Admission: RE | Admit: 2017-01-27 | Payer: Self-pay | Source: Ambulatory Visit

## 2017-02-09 ENCOUNTER — Telehealth: Payer: Self-pay | Admitting: Neurology

## 2017-02-09 NOTE — Telephone Encounter (Signed)
Pt called he missed the appt for infusion at [facility]. He spoke with [facility] infusion this morning and was advised by them the clinic would need to r/s that appt. Please call to discuss.

## 2017-02-09 NOTE — Telephone Encounter (Signed)
I have spoken with Aundra Millet at [facility] short stay.  Geraldine Contras is not available right now to r/s pt's Ocrevus infusion.  She is in a class until 2pm.  Message left for her to call me back.  I spoke with pt. and explained this/fim

## 2017-02-09 NOTE — Telephone Encounter (Signed)
Spoke with [name] at [facility] short stay, and r/s pt's Ocrevus infusion for 02/16/17 at 8am, arrival time of 0730.  Spoke with pt. and gave appt. details.  He is agreeable/fim

## 2017-02-16 ENCOUNTER — Ambulatory Visit (HOSPITAL_COMMUNITY)
Admission: RE | Admit: 2017-02-16 | Discharge: 2017-02-16 | Disposition: A | Discharge status: Home / Self Care | Payer: Medicaid Other | Source: Ambulatory Visit | Attending: Neurology | Admitting: Neurology

## 2017-02-16 ENCOUNTER — Encounter (HOSPITAL_COMMUNITY): Payer: Self-pay

## 2017-02-16 ENCOUNTER — Other Ambulatory Visit (HOSPITAL_COMMUNITY): Payer: Self-pay | Admitting: Neurology

## 2017-02-16 DIAGNOSIS — G35 Multiple sclerosis: Secondary | ICD-10-CM | POA: Diagnosis not present

## 2017-02-16 MED ORDER — METHYLPREDNISOLONE SODIUM SUCC 125 MG IJ SOLR
125.0000 mg | INTRAMUSCULAR | Status: AC
  Administered 2017-02-16: 125 mg via INTRAVENOUS
  Filled 2017-02-16: qty 2

## 2017-02-16 MED ORDER — DIPHENHYDRAMINE HCL 25 MG PO CAPS
50.0000 mg | ORAL_CAPSULE | Freq: Once | ORAL | Status: AC
  Administered 2017-02-16: 50 mg via ORAL
  Filled 2017-02-16: qty 2

## 2017-02-16 MED ORDER — OCRELIZUMAB 300 MG/10ML IV SOLN
600.0000 mg | Freq: Once | INTRAVENOUS | Status: AC
  Administered 2017-02-16: 600 mg via INTRAVENOUS
  Filled 2017-02-16: qty 20

## 2017-02-16 MED ORDER — SODIUM CHLORIDE 0.9 % IV SOLN
Freq: Once | INTRAVENOUS | Status: AC
  Administered 2017-02-16: 08:00:00 via INTRAVENOUS

## 2017-02-16 NOTE — Discharge Instructions (Signed)
Your next appt is due in April.  Contact Dr. Anne HahnWillis' office [phone] prior to that to make appointment for this Ocrevus infusion in April.     Ocrevus Ocrelizumab injection What is this medicine? OCRELIZUMAB (ok re LIZ ue mab) treats multiple sclerosis. It helps to decrease the number of multiple sclerosis relapses. It is not a cure. This medicine may be used for other purposes; ask your health care provider or pharmacist if you have questions. COMMON BRAND NAME(S): OCREVUS What should I tell my health care provider before I take this medicine? They need to know if you have any of these conditions: -cancer -hepatitis B infection -other infection (especially a virus infection such as chickenpox, cold sores, or herpes) -an unusual or allergic reaction to ocrelizumab, other medicines, foods, dyes or preservatives -pregnant or trying to get pregnant -breast-feeding How should I use this medicine? This medicine is for infusion into a vein. It is given by a health care professional in a hospital or clinic setting. Talk to your pediatrician regarding the use of this medicine in children. Special care may be needed. Overdosage: If you think you have taken too much of this medicine contact a poison control center or emergency room at once. NOTE: This medicine is only for you. Do not share this medicine with others. What if I miss a dose? Keep appointments for follow-up doses as directed. It is important not to miss your dose. Call your doctor or health care professional if you are unable to keep an appointment. What may interact with this medicine? -alemtuzumab -daclizumab -dimethyl fumarate -fingolimod -glatiramer -interferon beta -live virus vaccines -mitoxantrone -natalizumab -peginterferon beta -rituximab -steroid medicines like prednisone or cortisone -teriflunomide This list may not describe all possible interactions. Give your health care provider a list of all the  medicines, herbs, non-prescription drugs, or dietary supplements you use. Also tell them if you smoke, drink alcohol, or use illegal drugs. Some items may interact with your medicine. What should I watch for while using this medicine? Tell your doctor or healthcare professional if your symptoms do not start to get better or if they get worse. This medicine can cause serious allergic reactions. To reduce your risk you may need to take medicine before treatment with this medicine. Take your medicine as directed. Women should inform their doctor if they wish to become pregnant or think they might be pregnant. There is a potential for serious side effects to an unborn child. Talk to your health care professional or pharmacist for more information. Male patients should use effective birth control methods while receiving this medicine and for 6 months after the last dose. Call your doctor or health care professional for advice if you get a fever, chills or sore throat, or other symptoms of a cold or flu. Do not treat yourself. This drug decreases your body's ability to fight infections. Try to avoid being around people who are sick. If you have a hepatitis B infection or a history of a hepatitis B infection, talk to your doctor. The symptoms of hepatitis B may get worse if you take this medicine. In some patients, this medicine may cause a serious brain infection that may cause death. If you have any problems seeing, thinking, speaking, walking, or standing, tell your doctor right away. If you cannot reach your doctor, urgently seek other source of medical care. This medicine can decrease the response to a vaccine. If you need to get vaccinated, tell your healthcare professional if you have received  this medicine. Extra booster doses may be needed. Talk to your doctor to see if a different vaccination schedule is needed. Talk to your doctor about your risk of cancer. You may be more at risk for certain types of  cancers if you take this medicine. What side effects may I notice from receiving this medicine? Side effects that you should report to your doctor or health care professional as soon as possible: -allergic reactions like skin rash, itching or hives, swelling of the face, lips, or tongue -breathing problems -facial flushing -fast, irregular heartbeat -lump or soreness in the breast -signs and symptoms of herpes such as cold sore, shingles, or genital sores -signs and symptoms of infection like fever or chills, cough, sore throat, pain or trouble passing urine -signs and symptoms of low blood pressure like dizziness; feeling faint or lightheaded, falls; unusually weak or tired -signs and symptoms of progressive multifocal leukoencephalopathy (PML) like changes in vision; clumsiness; confusion; personality changes; weakness on one side of the body -swelling of the ankles, feet, hands Side effects that usually do not require medical attention (report these to your doctor or health care professional if they continue or are bothersome): -back pain -depressed mood -diarrhea -pain, redness, or irritation at site where injected This list may not describe all possible side effects. Call your doctor for medical advice about side effects. You may report side effects to FDA at 1-800-FDA-1088. Where should I keep my medicine? This drug is given in a hospital or clinic and will not be stored at home. NOTE: This sheet is a summary. It may not cover all possible information. If you have questions about this medicine, talk to your doctor, pharmacist, or health care provider.  2018 Elsevier/Gold Standard (2015-07-30 09:40:25)

## 2017-02-16 NOTE — Progress Notes (Signed)
Tolerated well full dose of 600mg  of Ocrevus. Patient made aware to call Md office to schedule next 6 mos appointment as we ([facility] short stay) are currently able to schedule out appointments in 2019 due to upcoming departmental move. Patient aware and stated he will call office.

## 2017-04-23 ENCOUNTER — Other Ambulatory Visit: Payer: Self-pay | Admitting: Neurology

## 2017-08-05 ENCOUNTER — Telehealth: Payer: Self-pay | Admitting: Adult Health

## 2017-08-05 NOTE — Telephone Encounter (Signed)
Wanted appt made for ocrevus, last being 01/2017.  He is having some bilateral LE weakness, back pain, hard to get out of bed, some jitteriness. These sx have been going on for 2 months and progressively getting weaker.  I made appt for him on Monday 08/09/2017 at 0800.  for evaluation.

## 2017-08-05 NOTE — Telephone Encounter (Signed)
Pt is wanting to get another infusion at [facility]. He has called [facility] several times and was told to call the clinic to get it set up. Pt said his legs are getting weaker. Please call to advise

## 2017-08-09 ENCOUNTER — Telehealth: Payer: Self-pay | Admitting: Adult Health

## 2017-08-09 ENCOUNTER — Encounter: Payer: Self-pay | Admitting: Adult Health

## 2017-08-09 ENCOUNTER — Ambulatory Visit: Payer: Medicaid Other | Admitting: Adult Health

## 2017-08-09 VITALS — BP 119/80 | HR 64

## 2017-08-09 DIAGNOSIS — R269 Unspecified abnormalities of gait and mobility: Secondary | ICD-10-CM

## 2017-08-09 DIAGNOSIS — G35 Multiple sclerosis: Secondary | ICD-10-CM | POA: Diagnosis not present

## 2017-08-09 DIAGNOSIS — Z5181 Encounter for therapeutic drug level monitoring: Secondary | ICD-10-CM

## 2017-08-09 NOTE — Telephone Encounter (Signed)
 tracks  [phone],  Spoke to [place] at [facility], (4th person) [phone] for PA on Ocrevus.  Pt has had #1 07/2016, #2 01/2017, #3 due 07/2017.  Sent to [facility] clinical review needed PA# [id number].

## 2017-08-09 NOTE — Progress Notes (Signed)
PATIENT: [name] DOB: November 09, 1965  REASON FOR VISIT: follow up HISTORY FROM: patient  HISTORY OF PRESENT ILLNESS: Today 08/09/17 [name] is a 52 year old male with a history of multiple sclerosis.  He returns today for follow-up.  He states that he had a second dose of Ocrevus in October.  He states that he is supposed to have another infusion this month but the infusion center has not scheduled him.  The patient states in the last month he feels that his legs have gotten weaker.  He uses a motorized wheelchair.  He states that he would like a new walker as the one he has is not sturdy and therefore he does feel he should use it.  He states that he is able to walk short distances.  He states that in the last 2 weeks he noticed that he may have numbness in his hands.  He denies any changes in his vision.  No change in bowel or bladder.  He continues to use baclofen 3 times a day.  Reports that he does have some pain in his leg when ambulating.  He returns today for an evaluation.  HISTORY [name] is a 52 year old male with a history of multiple sclerosis. He returns today for follow-up. At the last visit a visual evoked response test was positive. In the past lumbar puncture has been positive for oligoclonal bands. The patient was started on Ocrevus. He had his first infusion and second infusion  will be October. Overall the patient states that he is well. Denies any new symptoms. Denies numbness or weakness. Denies any changes with his gait or balance. Patient reports that he primarily uses a motorized wheelchair. He states that he will use a walker for short distances and transfers primarily uses the wheelchair. Denies any changes in bowels or bladder. No changes in vision. The patient does report that he was recently diagnosed with herpes. He reports that he has undergone treatment and is no longer on medication. He returns today for an evaluation   REVIEW OF SYSTEMS: Out of a  complete 14 system review of symptoms, the patient complains only of the following symptoms, and all other reviewed systems are negative.  See HPI  ALLERGIES: Allergies  Allergen Reactions  . Morphine And Related Hives    HOME MEDICATIONS: Outpatient Medications Prior to Visit  Medication Sig Dispense Refill  . baclofen (LIORESAL) 20 MG tablet TAKE ONE TABLET BY MOUTH THREE TIMES A DAY. 90 tablet 2  . FLUoxetine (PROZAC) 40 MG capsule Take 40 mg by mouth every morning.    Marland Kitchen lisinopril-hydrochlorothiazide (PRINZIDE,ZESTORETIC) 20-12.5 MG tablet Take 1 tablet by mouth every morning.     . montelukast (SINGULAIR) 10 MG tablet Take 10 mg by mouth every morning.     Marland Kitchen ocrelizumab in sodium chloride 0.9 % 500 mL 300mg  IV on day 1 and repeat 300mg  IV on day 15 2 each 0  . tamsulosin (FLOMAX) 0.4 MG CAPS capsule Take 1 capsule (0.4 mg total) by mouth daily. (Patient taking differently: Take 0.4 mg by mouth daily after breakfast. ) 30 capsule 1  . traZODone (DESYREL) 150 MG tablet Take 75 mg by mouth at bedtime.      No facility-administered medications prior to visit.     PAST MEDICAL HISTORY: Past Medical History:  Diagnosis Date  . Abnormality of gait 11/28/2015  . BPH (benign prostatic hyperplasia)   . Depression   . Environmental and seasonal allergies   .  History of hiatal hernia   . Hypertension   . 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 APPENDECTOMY    . CLOSED REDUCTION NASAL FRACTURE  10-08-2004   and Septoplasty  . HERNIA REPAIR     pt states has had hiatal hernia repair   . left ankle surgery     . LUMBAR DISC SURGERY  11-02-2000   L4 -- L5  . ORIF RIGHT PROXIMAL HUMERUS FX AND ORIF RIGHT ANKLE FX  01-27-2007  . RIGHT SHOULDER MANIPULATION WITH HARDWARE REMOVAL/  RIGHT TIBIA NONUNION SITE  HARDWARE REMOVAL AND EXCHANGED AND BONE  GRAFT  AND ALLOGRAFT  05-10-2007  . TRANSURETHRAL RESECTION OF PROSTATE N/A 10/28/2015   Procedure: TRANSURETHRAL RESECTION OF THE PROSTATE WITH GYRUS INSTRUMENTS;  Surgeon: Malen Gauze, MD;  Location: [facility];  Service: Urology;  Laterality: N/A;    FAMILY HISTORY: Family History  Problem Relation Age of Onset  . Colon cancer Mother     SOCIAL HISTORY: Social History   Socioeconomic History  . Marital status: Divorced    Spouse name: Not on file  . Number of children: 1  . Years of education: 45  . Highest education level: Not on file  Occupational History  . Occupation: N/A  Social Needs  . Financial resource strain: Not on file  . Food insecurity:    Worry: Not on file    Inability: Not on file  . Transportation needs:    Medical: Not on file    Non-medical: Not on file  Tobacco Use  . Smoking status: Current Every Day Smoker    Packs/day: 0.50    Years: 30.00    Pack years: 15.00    Types: Cigarettes  . Smokeless tobacco: Never Used  Substance and Sexual Activity  . Alcohol use: Yes    Comment: Drinks beer occasionally  . Drug use: No    Types: Cocaine, Marijuana    Comment:  cocaine use--  per pt last cocaine Feb 2017  . Sexual activity: Not on file  Lifestyle  . Physical activity:    Days per week: Not on file    Minutes per session: Not on file  . Stress: Not on file  Relationships  . Social connections:    Talks on phone: Not on file    Gets together: Not on file    Attends religious service: Not on file    Active member of club or organization: Not on file    Attends meetings of clubs or organizations: Not on file    Relationship status: Not on file  . Intimate partner violence:    Fear of current or ex partner: Not on file    Emotionally abused: Not on file    Physically abused: Not on file    Forced sexual activity: Not on file  Other Topics Concern  . Not on file  Social History Narrative   Lives alone   Right-handed   Caffeine: 2  cups of coffee and 20 oz soda/day      PHYSICAL EXAM  Vitals:   08/09/17 0727  BP: 119/80  Pulse: 64   There is no height or weight on file to calculate BMI.  Generalized: Well developed, in no acute distress   Neurological examination  Mentation: Alert oriented to time, place, history taking. Follows all commands speech and language fluent Cranial nerve II-XII:  Pupils were equal round reactive to light. Extraocular movements were full, visual field were full on confrontational test. Facial sensation and strength were normal. Uvula tongue midline. Head turning and shoulder shrug  were normal and symmetric. Motor: The motor testing reveals 5 over 5 strength in the upper extremities.  3-4 strength in the lower extremities. Sensory: Sensory testing is intact to soft touch on all 4 extremities. No evidence of extinction is noted.  Coordination: Cerebellar testing reveals good finger-nose-finger and heel-to-shin bilaterally.  Gait and station: Patient is in a motorized wheelchair today. Reflexes: Deep tendon reflexes are symmetric and normal bilaterally.   DIAGNOSTIC DATA (LABS, IMAGING, TESTING) - I reviewed patient records, labs, notes, testing and imaging myself where available.  Lab Results  Component Value Date   WBC 4.2 12/17/2016   HGB 15.7 12/17/2016   HCT 46.2 12/17/2016   MCV 95 12/17/2016   PLT 283 12/17/2016      Component Value Date/Time   NA 140 12/17/2016 1333   K 4.2 12/17/2016 1333   CL 102 12/17/2016 1333   CO2 25 12/17/2016 1333   GLUCOSE 87 12/17/2016 1333   GLUCOSE 141 (H) 10/29/2015 0517   BUN 17 12/17/2016 1333   CREATININE 1.23 12/17/2016 1333   CALCIUM 10.0 12/17/2016 1333   PROT 7.4 12/17/2016 1333   ALBUMIN 4.6 12/17/2016 1333   AST 18 12/17/2016 1333   ALT 11 12/17/2016 1333   ALKPHOS 53 12/17/2016 1333   BILITOT 0.4 12/17/2016 1333   GFRNONAA 67 12/17/2016 1333   GFRAA 77 12/17/2016 1333      ASSESSMENT AND PLAN 52 y.o. 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 here with :  1.  Multiple sclerosis 2.  Gait abnormality  We will get the patient set up for his next infusion.  I will check blood work today.  Patient will also be scheduled for an MRI of the brain to evaluate progression of multiple sclerosis.  He will continue on baclofen for spasticity.  I will also provide an order for the patient to have a new walker.  He is advised that if his symptoms worsen or he develops new symptoms he should let us know.  He will follow-up in 6 months or sooner if needed.    Butch Penny, MSN, NP-C 08/09/2017, 7:49 AM [facility] [address] [place], [place] [address] [phone]

## 2017-08-09 NOTE — Telephone Encounter (Signed)
Medicaid order sent to [facility]. They obtain the auth and will reach out to the patient to schedule.  °

## 2017-08-09 NOTE — Progress Notes (Signed)
I have read the note, and I agree with the clinical assessment and plan.  Charles K Willis   

## 2017-08-09 NOTE — Patient Instructions (Signed)
Your Plan:  Continue Ocrevus Blood work today MRI brain If your symptoms worsen or you develop new symptoms please let us know.   Thank you for coming to see [place] at [facility]. I hope we have been able to provide you high quality care today.  You may receive a patient satisfaction survey over the next few weeks. We would appreciate your feedback and comments so that we may continue to improve ourselves and the health of our patients.

## 2017-08-10 ENCOUNTER — Telehealth: Payer: Self-pay | Admitting: Neurology

## 2017-08-10 LAB — COMPREHENSIVE METABOLIC PANEL
ALK PHOS: 60 IU/L (ref 39–117)
ALT: 12 IU/L (ref 0–44)
AST: 15 IU/L (ref 0–40)
Albumin/Globulin Ratio: 2 (ref 1.2–2.2)
Albumin: 4.7 g/dL (ref 3.5–5.5)
BILIRUBIN TOTAL: 0.4 mg/dL (ref 0.0–1.2)
BUN/Creatinine Ratio: 10 (ref 9–20)
BUN: 13 mg/dL (ref 6–24)
CHLORIDE: 103 mmol/L (ref 96–106)
CO2: 22 mmol/L (ref 20–29)
CREATININE: 1.34 mg/dL — AB (ref 0.76–1.27)
Calcium: 10.2 mg/dL (ref 8.7–10.2)
GFR calc Af Amer: 69 mL/min/{1.73_m2} (ref >59)
GFR calc non Af Amer: 60 mL/min/{1.73_m2} (ref >59)
Globulin, Total: 2.4 g/dL (ref 1.5–4.5)
Glucose: 134 mg/dL — ABNORMAL HIGH (ref 65–99)
Potassium: 4.1 mmol/L (ref 3.5–5.2)
Sodium: 142 mmol/L (ref 134–144)
Total Protein: 7.1 g/dL (ref 6.0–8.5)

## 2017-08-10 LAB — CBC WITH DIFFERENTIAL/PLATELET
Basophils Absolute: 0.1 10*3/uL (ref 0.0–0.2)
Basos: 2 %
EOS (ABSOLUTE): 0.1 10*3/uL (ref 0.0–0.4)
EOS: 2 %
HEMATOCRIT: 41.5 % (ref 37.5–51.0)
HEMOGLOBIN: 14.4 g/dL (ref 13.0–17.7)
IMMATURE GRANULOCYTES: 0 %
Immature Grans (Abs): 0 10*3/uL (ref 0.0–0.1)
LYMPHS ABS: 1.3 10*3/uL (ref 0.7–3.1)
Lymphs: 41 %
MCH: 31.2 pg (ref 26.6–33.0)
MCHC: 34.7 g/dL (ref 31.5–35.7)
MCV: 90 fL (ref 79–97)
MONOCYTES: 8 %
MONOS ABS: 0.2 10*3/uL (ref 0.1–0.9)
NEUTROS PCT: 47 %
Neutrophils Absolute: 1.5 10*3/uL (ref 1.4–7.0)
Platelets: 256 10*3/uL (ref 150–379)
RBC: 4.61 x10E6/uL (ref 4.14–5.80)
RDW: 13.9 % (ref 12.3–15.4)
WBC: 3.1 10*3/uL — AB (ref 3.4–10.8)

## 2017-08-10 NOTE — Telephone Encounter (Signed)
-----   Message from Butch Penny, NP sent at 08/10/2017  3:27 PM EDT ----- WBC slightly decreased. We will continue to monitor. Please call patient.

## 2017-08-10 NOTE — Telephone Encounter (Signed)
Approval for Ocrevus 08/09/2017 thru 08-04-2018 Confirmation # [id number] P. Faxed signed order to [facility] (received fax confirmation.  Spoke to pt and relayed that orders faxed to [facility], he could not take information down as in the car.  I called him back and LMVM for him with both numbers to [facility] [phone], and [facility] [address] for DME Dan Humphreys [phone].  He is to call me back if he wants me to fax order.

## 2017-08-10 NOTE — Telephone Encounter (Signed)
Called the patient to review the lab work, no answer. LVM for the pt to call back.

## 2017-08-11 ENCOUNTER — Other Ambulatory Visit: Payer: Self-pay | Admitting: Neurology

## 2017-08-11 NOTE — Telephone Encounter (Signed)
Spoke to pt and he was aware that [facility] had order for his MRI.  He will f/u with them if he does not hear from them.

## 2017-08-11 NOTE — Telephone Encounter (Signed)
I spoke to pt and relayed the lab results to pt. (slightly decreased wbc, will monitor).  He verbalized understanding.   I faxed to Dr. Parke Simmers his pcp whom he has an appt with tomorrow at 0900.

## 2017-08-11 NOTE — Telephone Encounter (Signed)
I spoke to pt and relayed lab results, his MRI order was faxed to [facility] and he will call them.  I faxed his lab results to Dr. Parke Simmers, pcp for his appt tomorrow.  (with fax confirmation). I LMVM that I faxed to [facility] his DME Dan Humphreys order with fax confirmation, they had not received yet when I called (still in transition).  I gave him the number to call and check on status [phone] (referral support team).   I also gave him the appt for his Ocrevus infusion at [facility] Wednesday Aug 25, 2017 at 0700, be there at 0645.  I left him via VM the number to call if needed to change this. [phone]. He will call me back if needed.

## 2017-08-12 ENCOUNTER — Emergency Department (HOSPITAL_COMMUNITY)
Admission: EM | Admit: 2017-08-12 | Discharge: 2017-08-12 | Disposition: A | Discharge status: Home / Self Care | Payer: Medicaid Other | Attending: Emergency Medicine | Admitting: Emergency Medicine

## 2017-08-12 ENCOUNTER — Emergency Department (HOSPITAL_COMMUNITY): Payer: Medicaid Other

## 2017-08-12 ENCOUNTER — Encounter (HOSPITAL_COMMUNITY): Payer: Self-pay | Admitting: Emergency Medicine

## 2017-08-12 DIAGNOSIS — F17210 Nicotine dependence, cigarettes, uncomplicated: Secondary | ICD-10-CM | POA: Insufficient documentation

## 2017-08-12 DIAGNOSIS — Y92410 Unspecified street and highway as the place of occurrence of the external cause: Secondary | ICD-10-CM | POA: Insufficient documentation

## 2017-08-12 DIAGNOSIS — S0990XA Unspecified injury of head, initial encounter: Secondary | ICD-10-CM | POA: Diagnosis present

## 2017-08-12 DIAGNOSIS — Y939 Activity, unspecified: Secondary | ICD-10-CM | POA: Diagnosis not present

## 2017-08-12 DIAGNOSIS — Y999 Unspecified external cause status: Secondary | ICD-10-CM | POA: Insufficient documentation

## 2017-08-12 DIAGNOSIS — I10 Essential (primary) hypertension: Secondary | ICD-10-CM | POA: Diagnosis not present

## 2017-08-12 DIAGNOSIS — S0181XA Laceration without foreign body of other part of head, initial encounter: Secondary | ICD-10-CM

## 2017-08-12 DIAGNOSIS — S42031A Displaced fracture of lateral end of right clavicle, initial encounter for closed fracture: Secondary | ICD-10-CM | POA: Diagnosis not present

## 2017-08-12 DIAGNOSIS — S0001XA Abrasion of scalp, initial encounter: Secondary | ICD-10-CM

## 2017-08-12 HISTORY — DX: Multiple sclerosis: G35

## 2017-08-12 LAB — ETHANOL

## 2017-08-12 LAB — COMPREHENSIVE METABOLIC PANEL
ALT: 15 U/L — AB (ref 17–63)
AST: 17 U/L (ref 15–41)
Albumin: 4.2 g/dL (ref 3.5–5.0)
Alkaline Phosphatase: 46 U/L (ref 38–126)
Anion gap: 12 (ref 5–15)
BILIRUBIN TOTAL: 0.9 mg/dL (ref 0.3–1.2)
BUN: 13 mg/dL (ref 6–20)
CALCIUM: 9.6 mg/dL (ref 8.9–10.3)
CHLORIDE: 104 mmol/L (ref 101–111)
CO2: 22 mmol/L (ref 22–32)
CREATININE: 1.4 mg/dL — AB (ref 0.61–1.24)
GFR, EST NON AFRICAN AMERICAN: 56 mL/min — AB (ref >60)
Glucose, Bld: 87 mg/dL (ref 65–99)
Potassium: 3.8 mmol/L (ref 3.5–5.1)
Sodium: 138 mmol/L (ref 135–145)
TOTAL PROTEIN: 7.1 g/dL (ref 6.5–8.1)

## 2017-08-12 LAB — CBC
HCT: 41.1 % (ref 39.0–52.0)
Hemoglobin: 13.8 g/dL (ref 13.0–17.0)
MCH: 31.2 pg (ref 26.0–34.0)
MCHC: 33.6 g/dL (ref 30.0–36.0)
MCV: 93 fL (ref 78.0–100.0)
PLATELETS: 213 10*3/uL (ref 150–400)
RBC: 4.42 MIL/uL (ref 4.22–5.81)
RDW: 13 % (ref 11.5–15.5)
WBC: 4.5 10*3/uL (ref 4.0–10.5)

## 2017-08-12 MED ORDER — HYDROCODONE-ACETAMINOPHEN 5-325 MG PO TABS: 2.0000 | ORAL_TABLET | ORAL | 0 refills | Status: DC | PRN

## 2017-08-12 MED ORDER — HYDROCODONE-ACETAMINOPHEN 5-325 MG PO TABS
1.0000 | ORAL_TABLET | Freq: Once | ORAL | Status: AC
  Administered 2017-08-12: 1 via ORAL
  Filled 2017-08-12: qty 1

## 2017-08-12 NOTE — ED Notes (Signed)
Notified [facility] for transportation 
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
## 2017-08-12 NOTE — Discharge Instructions (Signed)
Use sling for comfort, use Tylenol Motrin for pain.
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
## 2017-08-13 ENCOUNTER — Encounter: Payer: Self-pay | Admitting: Adult Health
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
## 2017-08-20 ENCOUNTER — Other Ambulatory Visit: Payer: Self-pay | Admitting: Neurology

## 2017-08-20 ENCOUNTER — Encounter (HOSPITAL_COMMUNITY): Payer: Self-pay | Admitting: Emergency Medicine

## 2017-08-20 ENCOUNTER — Emergency Department (HOSPITAL_COMMUNITY)
Admission: EM | Admit: 2017-08-20 | Discharge: 2017-08-20 | Disposition: A | Discharge status: Home / Self Care | Payer: Medicaid Other | Attending: Emergency Medicine | Admitting: Emergency Medicine

## 2017-08-20 DIAGNOSIS — Z79899 Other long term (current) drug therapy: Secondary | ICD-10-CM | POA: Diagnosis not present

## 2017-08-20 DIAGNOSIS — F17210 Nicotine dependence, cigarettes, uncomplicated: Secondary | ICD-10-CM | POA: Insufficient documentation

## 2017-08-20 DIAGNOSIS — R55 Syncope and collapse: Secondary | ICD-10-CM

## 2017-08-20 DIAGNOSIS — I10 Essential (primary) hypertension: Secondary | ICD-10-CM | POA: Diagnosis not present

## 2017-08-20 DIAGNOSIS — E860 Dehydration: Secondary | ICD-10-CM | POA: Diagnosis not present

## 2017-08-20 DIAGNOSIS — N3001 Acute cystitis with hematuria: Secondary | ICD-10-CM | POA: Diagnosis not present

## 2017-08-20 DIAGNOSIS — R109 Unspecified abdominal pain: Secondary | ICD-10-CM | POA: Diagnosis not present

## 2017-08-20 LAB — LIPASE, BLOOD: LIPASE: 26 U/L (ref 11–51)

## 2017-08-20 LAB — URINALYSIS, ROUTINE W REFLEX MICROSCOPIC
Bilirubin Urine: NEGATIVE
Glucose, UA: NEGATIVE mg/dL
Ketones, ur: 5 mg/dL — AB
Nitrite: NEGATIVE
Protein, ur: NEGATIVE mg/dL
SPECIFIC GRAVITY, URINE: 1.02 (ref 1.005–1.030)
WBC, UA: >50 WBC/hpf — ABNORMAL HIGH (ref 0–5)
pH: 5 (ref 5.0–8.0)

## 2017-08-20 LAB — COMPREHENSIVE METABOLIC PANEL
ALK PHOS: 58 U/L (ref 38–126)
ALT: 14 U/L — AB (ref 17–63)
ANION GAP: 12 (ref 5–15)
AST: 19 U/L (ref 15–41)
Albumin: 4.3 g/dL (ref 3.5–5.0)
BUN: 20 mg/dL (ref 6–20)
CHLORIDE: 108 mmol/L (ref 101–111)
CO2: 20 mmol/L — ABNORMAL LOW (ref 22–32)
Calcium: 9.7 mg/dL (ref 8.9–10.3)
Creatinine, Ser: 1.81 mg/dL — ABNORMAL HIGH (ref 0.61–1.24)
GFR, EST AFRICAN AMERICAN: 47 mL/min — AB (ref >60)
GFR, EST NON AFRICAN AMERICAN: 41 mL/min — AB (ref >60)
GLUCOSE: 133 mg/dL — AB (ref 65–99)
Potassium: 3.7 mmol/L (ref 3.5–5.1)
SODIUM: 140 mmol/L (ref 135–145)
TOTAL PROTEIN: 7.3 g/dL (ref 6.5–8.1)
Total Bilirubin: 0.9 mg/dL (ref 0.3–1.2)

## 2017-08-20 LAB — CBC
HCT: 40.4 % (ref 39.0–52.0)
Hemoglobin: 13.5 g/dL (ref 13.0–17.0)
MCH: 31 pg (ref 26.0–34.0)
MCHC: 33.4 g/dL (ref 30.0–36.0)
MCV: 92.9 fL (ref 78.0–100.0)
Platelets: 230 10*3/uL (ref 150–400)
RBC: 4.35 MIL/uL (ref 4.22–5.81)
RDW: 12.8 % (ref 11.5–15.5)
WBC: 6.4 10*3/uL (ref 4.0–10.5)

## 2017-08-20 LAB — RAPID URINE DRUG SCREEN, HOSP PERFORMED
AMPHETAMINES: NOT DETECTED
BENZODIAZEPINES: NOT DETECTED
Barbiturates: NOT DETECTED
Cocaine: NOT DETECTED
OPIATES: NOT DETECTED
Tetrahydrocannabinol: POSITIVE — AB

## 2017-08-20 LAB — ETHANOL: Alcohol, Ethyl (B): <10 mg/dL (ref <10)

## 2017-08-20 LAB — CBG MONITORING, ED: GLUCOSE-CAPILLARY: 97 mg/dL (ref 65–99)

## 2017-08-20 MED ORDER — CEPHALEXIN 500 MG PO CAPS: 500.0000 mg | ORAL_CAPSULE | Freq: Three times a day (TID) | ORAL | 0 refills | Status: AC

## 2017-08-20 MED ORDER — SODIUM CHLORIDE 0.9 % IV BOLUS
1000.0000 mL | Freq: Once | INTRAVENOUS | Status: AC
  Administered 2017-08-20: 1000 mL via INTRAVENOUS

## 2017-08-20 MED ORDER — OCRELIZUMAB 300 MG/10ML IV SOLN: INTRAVENOUS | 1 refills | Status: DC

## 2017-08-20 MED ORDER — SODIUM CHLORIDE 0.9 % IV SOLN: 600.0000 mg | Freq: Once | INTRAVENOUS | 1 refills | Status: DC

## 2017-08-20 MED ORDER — SODIUM CHLORIDE 0.9 % IV SOLN
1.0000 g | Freq: Once | INTRAVENOUS | Status: AC
  Administered 2017-08-20: 1 g via INTRAVENOUS
  Filled 2017-08-20: qty 10

## 2017-08-20 MED ORDER — SODIUM CHLORIDE 0.9 % IV SOLN
INTRAVENOUS | Status: DC
  Administered 2017-08-20: 16:00:00 via INTRAVENOUS

## 2017-08-20 MED FILL — OCREVUS 300 MG/10ML SOLN: 300 | 30 days supply | Qty: 20 | Fill #0

## 2017-08-20 NOTE — ED Provider Notes (Signed)
[facility] EMERGENCY DEPARTMENT Provider Note  CSN: [id number] Arrival date & time: 08/20/17 1140  Chief Complaint(s) Abdominal Pain  HPI [name] is a 52 y.o. male   The history is provided by the patient.  Near Syncope  This is a new problem. The current episode started 3 to 5 hours ago. Episode frequency: once. The problem has been resolved. Associated symptoms include abdominal pain (abd cramping with  dry heaving). Pertinent negatives include no chest pain, no headaches and no shortness of breath. Exacerbated by: sitting. Nothing relieves the symptoms. He has tried nothing for the symptoms.   Patient reports that he was in his normal state of health this morning.  States that he went out to smoke a cigarette and after coming back into the facility he felt lightheaded, clammy and began having abdominal cramping and dry heaving.  He denies any recent fevers or infections.  Denies any associated chest pain or shortness of breath.  Denied any falls or trauma.  Denied any urinary symptoms.  Denies any recent emesis or diarrhea.  In substance abuse rehab facility for 1 yr. No illicit drug use for 6 mo.  Abdominal cramping is now completely resolved and has no physical complaints at this time.  Past Medical History Past Medical History:  Diagnosis Date  . Abnormality of gait 11/28/2015  . BPH (benign prostatic hyperplasia)   . Depression   . Environmental and seasonal allergies   . History of hiatal hernia   . Hypertension   . Multiple sclerosis (HCC)   . 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 Abnormality of gait 11/28/2015  . 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 BPH (benign prostatic hyperplasia) 10/28/2015  . Fracture of fibula with tibia, right, closed 12/09/2011  . Muscle  weakness (generalized) 12/09/2011  . Difficulty in walking(719.7) 12/09/2011   Home Medication(s) Prior to Admission medications   Medication Sig Start Date End Date Taking? Authorizing Provider  baclofen (LIORESAL) 20 MG tablet TAKE ONE TABLET BY MOUTH THREE TIMES A DAY. 08/11/17   Butch Penny, NP  cephALEXin (KEFLEX) 500 MG capsule Take 1 capsule (500 mg total) by mouth 3 (three) times daily for 7 days. 08/20/17 08/27/17  Nira Conn, MD  FLUoxetine (PROZAC) 40 MG capsule Take 40 mg by mouth every morning.    [provider]  HYDROcodone-acetaminophen (NORCO/VICODIN) 5-325 MG tablet Take 2 tablets by mouth every 4 (four) hours as needed for up to 10 doses. 08/12/17   Curatolo, Adam, DO  lisinopril-hydrochlorothiazide (PRINZIDE,ZESTORETIC) 20-12.5 MG tablet Take 1 tablet by mouth every morning.     [provider]  montelukast (SINGULAIR) 10 MG tablet Take 10 mg by mouth every morning.     [provider]  ocrelizumab (OCREVUS) 300 MG/10ML injection 600mg  IV every 6 months 08/20/17   Sater, Pearletha Furl, MD  tamsulosin (FLOMAX) 0.4 MG CAPS capsule Take 1 capsule (0.4 mg total) by mouth daily. Patient taking differently: Take 0.4 mg by mouth daily after breakfast.  07/14/15   Geoffery Lyons, MD  traZODone (DESYREL) 150 MG tablet Take 75 mg by mouth at bedtime.     [provider]  Past Surgical History Past Surgical History:  Procedure Laterality Date  . APPENDECTOMY    . CLOSED REDUCTION NASAL FRACTURE  10-08-2004   and Septoplasty  . HERNIA REPAIR     pt states has had hiatal hernia repair   . HERNIA REPAIR    . left ankle surgery     . LUMBAR DISC SURGERY  11-02-2000   L4 -- L5  . LUMBAR DISC SURGERY  2002  . ORIF ANKLE FRACTURE Right 2008  . ORIF HUMERUS FRACTURE Right 2008  . ORIF RIGHT PROXIMAL HUMERUS FX AND  ORIF RIGHT ANKLE FX  01-27-2007  . RIGHT SHOULDER MANIPULATION WITH HARDWARE REMOVAL/  RIGHT TIBIA NONUNION SITE  HARDWARE REMOVAL AND EXCHANGED AND BONE GRAFT  AND ALLOGRAFT  05-10-2007  . SEPTOPLASTY  2006  . SHOULDER SURGERY Right 2009  . TRANSURETHRAL RESECTION OF PROSTATE N/A 10/28/2015   Procedure: TRANSURETHRAL RESECTION OF THE PROSTATE WITH GYRUS INSTRUMENTS;  Surgeon: Malen Gauze, MD;  Location: [facility];  Service: Urology;  Laterality: N/A;  . TRANSURETHRAL RESECTION OF PROSTATE  10/28/2015   Family History Family History  Problem Relation Age of Onset  . Colon cancer Mother     Social History Social History   Tobacco Use  . Smoking status: Current Every Day Smoker    Packs/day: 0.50    Years: 30.00    Pack years: 15.00    Types: Cigarettes  . Smokeless tobacco: Never Used  Substance Use Topics  . Alcohol use: Not Currently    Frequency: Never    Comment: Drinks beer occasionally  . Drug use: Not Currently    Types: Cocaine, Marijuana    Comment:  cocaine use--  per pt last cocaine Feb 2017   Allergies Morphine and related and Morphine and related  Review of Systems Review of Systems  Respiratory: Negative for shortness of breath.   Cardiovascular: Positive for near-syncope. Negative for chest pain.  Gastrointestinal: Positive for abdominal pain (abd cramping with  dry heaving).  Neurological: Negative for headaches.   All other systems are reviewed and are negative for acute change except as noted in the HPI  Physical Exam Vital Signs  I have reviewed the triage vital signs BP 96/60 (BP Location: Left Arm)   Pulse 62   Temp 98.5 F (36.9 C) (Oral)   Resp 20   Ht 5\' 11"  (1.803 m)   Wt 88.5 kg (195 lb)   SpO2 97%   BMI 27.20 kg/m   Physical Exam  Constitutional: He is oriented to person, place, and time. He appears well-developed and well-nourished. No distress.  HENT:  Head: Normocephalic and atraumatic.  Nose: Nose normal.  Eyes: Pupils are  equal, round, and reactive to light. Conjunctivae and EOM are normal. Right eye exhibits no discharge. Left eye exhibits no discharge. No scleral icterus.  Neck: Normal range of motion. Neck supple.  Cardiovascular: Normal rate and regular rhythm. Exam reveals no gallop and no friction rub.  No murmur heard. Pulmonary/Chest: Effort normal and breath sounds normal. No stridor. No respiratory distress. He has no rales.  Abdominal: Soft. He exhibits no distension. There is no tenderness.  Musculoskeletal: He exhibits no edema.       Right shoulder: He exhibits tenderness (chronic from prior surgery).  Neurological: He is alert and oriented to person, place, and time.  Moves all extremities with equal strength bilaterally.  Skin: Skin is warm and dry. No rash noted. He is not diaphoretic. No erythema.  Psychiatric: He  has a normal mood and affect.  Vitals reviewed.   ED Results and Treatments Labs (all labs ordered are listed, but only abnormal results are displayed) Labs Reviewed  COMPREHENSIVE METABOLIC PANEL - Abnormal; Notable for the following components:      Result Value   CO2 20 (*)    Glucose, Bld 133 (*)    Creatinine, Ser 1.81 (*)    ALT 14 (*)    GFR calc non Af Amer 41 (*)    GFR calc Af Amer 47 (*)    All other components within normal limits  URINALYSIS, ROUTINE W REFLEX MICROSCOPIC - Abnormal; Notable for the following components:   APPearance CLOUDY (*)    Hgb urine dipstick SMALL (*)    Ketones, ur 5 (*)    Leukocytes, UA LARGE (*)    WBC, UA >50 (*)    Bacteria, UA FEW (*)    All other components within normal limits  RAPID URINE DRUG SCREEN, HOSP PERFORMED - Abnormal; Notable for the following components:   Tetrahydrocannabinol POSITIVE (*)    All other components within normal limits  URINE CULTURE  LIPASE, BLOOD  CBC  ETHANOL  CBG MONITORING, ED                                                                                                                          EKG  EKG Interpretation  Date/Time:  Friday August 20 2017 11:43:22 EDT Ventricular Rate:  59 PR Interval:    QRS Duration: 111 QT Interval:  431 QTC Calculation: 427 R Axis:   -13 Text Interpretation:  Sinus rhythm Probable left atrial enlargement Abnormal R-wave progression, early transition Left ventricular hypertrophy No significant change since last tracing Confirmed by Drema Pry [phone]) on 08/20/2017 12:20:35 PM      Radiology No results found. Pertinent labs & imaging results that were available during my care of the patient were reviewed by me and considered in my medical decision making (see chart for details).  Medications Ordered in ED Medications  sodium chloride 0.9 % bolus 1,000 mL (0 mLs Intravenous Stopped 08/20/17 1451)    And  0.9 %  sodium chloride infusion ( Intravenous New Bag/Given 08/20/17 1608)  cefTRIAXone (ROCEPHIN) 1 g in sodium chloride 0.9 % 100 mL IVPB (1 g Intravenous New Bag/Given 08/20/17 1608)  sodium chloride 0.9 % bolus 1,000 mL (0 mLs Intravenous Stopped 08/20/17 1322)  Procedures Procedures   Emergency Focused Ultrasound Exam Limited Ultrasound Assessment for the evaluation of Hypotension (RUSH PROTOCOL)  Performed and interpreted by Dr. Eudelia Bunch Indication: Hypotension Multiple images of the bilateral lungs, heart, inferior vena cava, abdomen, and abdominal aorta are obtained for the purposes of estimating presence/absence of pneumothorax, cardiac contractility, volume status, abdominal free fluid and aortic aneurysm with a multifrequency probe. Findings: B lines and sliding lung, No anechoic fluid in abdomen, normal cardiac contractility, No anechoic fluid surrounding heart, + IVC collapse, No aortic dilation Interpretation: no pneumothorax, no hemoperitoneum, no pericardial effusion,  depressed CVP, no  abdominal aortic aneurysm Images archived electronically (some of the images were not saved due to machine malfunction).  CPT Codes: thorax 52841,  cardiac [id number], abdomen [id number], limited retroperitoneal [phone] (study includes all codes)   CRITICAL CARE Performed by: Amadeo Garnet Preslie Depasquale Total critical care time: 35 minutes Critical care time was exclusive of separately billable procedures and treating other patients. Critical care was necessary to treat or prevent imminent or life-threatening deterioration. Critical care was time spent personally by me on the following activities: development of treatment plan with patient and/or surrogate as well as nursing, discussions with consultants, evaluation of patient's response to treatment, examination of patient, obtaining history from patient or surrogate, ordering and performing treatments and interventions, ordering and review of laboratory studies, ordering and review of radiographic studies, pulse oximetry and re-evaluation of patient's condition.   (including critical care time)  Medical Decision Making / ED Course I have reviewed the nursing notes for this encounter and the patient's prior records (if available in EHR or on provided paperwork).    Near syncopal episode.  Most suspicious for vasovagal.  Abdomen currently benign.  Patient does have soft blood pressures.  He is currently afebrile and did not report any infectious symptoms.  EKG without significant changes and nonischemic.  Doubt cardiac etiology. POCUS did reveal evidence of hypovolemia; otherwise RUSH [place] negative. Labs did reveal evidence of mild renal insufficiency likely from dehydration.  No leukocytosis or anemia.  No other electrolyte derangements.  UA is suspicious for possible urinary tract infection but patient denied any dysuria.  He did report some intermittent frequency which is typical for him.  Patient was provided with 2 L of IV fluids, resulting in significant  improvement in his blood pressure.  Orthostatics following IV fluid hydration were stable.  Given the possible urinary tract infection he was given a dose of IV Rocephin.  Feel that the patient is stable for outpatient management.  The patient appears reasonably screened and/or stabilized for discharge and I doubt any other medical condition or other [facility] requiring further screening, evaluation, or treatment in the ED at this time prior to discharge.  The patient is safe for discharge with strict return precautions.   Final Clinical Impression(s) / ED Diagnoses Final diagnoses:  Near syncope  Dehydration  Acute cystitis with hematuria    Disposition: Discharge  Condition: Good  I have discussed the results, Dx and Tx plan with the patient who expressed understanding and agree(s) with the plan. Discharge instructions discussed at great length. The patient was given strict return precautions who verbalized understanding of the instructions. No further questions at time of discharge.    ED Discharge Orders        Ordered    cephALEXin (KEFLEX) 500 MG capsule  3 times daily     08/20/17 1629       Follow Up: Renaye Rakers, MD [address] [place] [address] [phone]  Schedule an appointment as soon as possible for a visit  in 5-7 days, If symptoms do not improve or  worsen     This chart was dictated using voice recognition software.  Despite best efforts to proofread,  errors can occur which can change the documentation meaning.   Nira Conn, MD 08/20/17 [phone]

## 2017-08-20 NOTE — ED Notes (Signed)
Pt given bus pass  Pt verbalized understanding discharge instructions and denies any further needs or questions at this time. VS stable, ambulatory and steady gait.

## 2017-08-20 NOTE — ED Triage Notes (Signed)
Per EMS: Pt c/o Adb pain starting today. Pt was at rehab this AM, felt hot and went out and outside to smoke cig. Pt started to feel nauseous and dry heaved with no vomit.  RLQ was tender to touch. Pt was diaphoretic and initial BP was 78 systolic. Pt given 400 NS

## 2017-08-23 LAB — URINE CULTURE

## 2017-08-24 ENCOUNTER — Telehealth: Payer: Self-pay | Admitting: *Deleted

## 2017-08-24 NOTE — Progress Notes (Signed)
ED Antimicrobial Stewardship Positive Culture Follow Up   [name] is an 52 y.o. male who presented to [facility] on 08/20/2017 with a chief complaint of  Chief Complaint  Patient presents with  . Abdominal Pain    Recent Results (from the past 720 hour(s))  Urine culture     Status: Abnormal   Collection Time: 08/20/17  3:51 PM  Result Value Ref Range Status   Specimen Description URINE, RANDOM  Final   Special Requests   Final    NONE Performed at [facility], [address] [place], [place] [address]    Culture 80,000 COLONIES/mL ENTEROCOCCUS FAECALIS (A)  Final   Report Status 08/23/2017 FINAL  Final   Organism ID, Bacteria ENTEROCOCCUS FAECALIS (A)  Final      Susceptibility   Enterococcus faecalis - MIC*    AMPICILLIN <=2 SENSITIVE Sensitive     LEVOFLOXACIN 1 SENSITIVE Sensitive     NITROFURANTOIN <=16 SENSITIVE Sensitive     VANCOMYCIN 1 SENSITIVE Sensitive     * 80,000 COLONIES/mL ENTEROCOCCUS FAECALIS    [x]  Treated with cephalexin, organism not treated by prescribed antimicrobial  Plan: Stop cephalexin.  Call patient to see if any reported urinary symptoms (increased frequency, urgency, or dysuria). If no symptoms, no further antibiotic therapy. If symptomatic, start:  New antibiotic prescription: Amoxicillin 500mg  twice daily for seven days.  ED Provider: Alveria Apley PA-C   Anselm Pancoast, PharmD Candidate 08/24/2017, 8:43 AM Phone# [phone]

## 2017-08-24 NOTE — Telephone Encounter (Signed)
Spoke with patient via phone.  States that he is feeling better and symptoms have resolved.  No further antibiotic treatment given at this time.

## 2017-08-25 ENCOUNTER — Ambulatory Visit (HOSPITAL_COMMUNITY)
Admission: RE | Admit: 2017-08-25 | Discharge: 2017-08-25 | Disposition: A | Discharge status: Home / Self Care | Payer: Medicaid Other | Source: Ambulatory Visit | Attending: Neurology | Admitting: Neurology

## 2017-08-25 ENCOUNTER — Encounter (HOSPITAL_COMMUNITY): Payer: Self-pay

## 2017-08-25 DIAGNOSIS — G35 Multiple sclerosis: Secondary | ICD-10-CM | POA: Diagnosis present

## 2017-08-25 MED ORDER — OCRELIZUMAB 300 MG/10ML IV SOLN
600.0000 mg | Freq: Once | INTRAVENOUS | Status: AC
  Administered 2017-08-25: 600 mg via INTRAVENOUS
  Filled 2017-08-25: qty 20

## 2017-08-25 MED ORDER — DIPHENHYDRAMINE HCL 25 MG PO CAPS
50.0000 mg | ORAL_CAPSULE | Freq: Once | ORAL | Status: AC
  Administered 2017-08-25: 50 mg via ORAL
  Filled 2017-08-25: qty 2

## 2017-08-25 MED ORDER — SODIUM CHLORIDE 0.9 % IV SOLN
INTRAVENOUS | Status: AC
  Administered 2017-08-25: 08:00:00 via INTRAVENOUS

## 2017-08-25 MED ORDER — SODIUM CHLORIDE 0.9 % IV SOLN
500.0000 mg | Freq: Once | INTRAVENOUS | Status: AC
  Administered 2017-08-25: 500 mg via INTRAVENOUS
  Filled 2017-08-25: qty 4

## 2017-08-25 MED ORDER — DIPHENHYDRAMINE HCL 50 MG/ML IJ SOLN
50.0000 mg | Freq: Once | INTRAMUSCULAR | Status: DC
  Filled 2017-08-25: qty 1

## 2017-08-25 NOTE — Progress Notes (Signed)
On 4/26 patient in the ER (see notes in Epic) diagnosed with urinary infection. Patient received one dose of iv antibiotics in ER and was given a script to use at home. He stated he didn't have the money to fill it but was going to be able to start this antibiotic today as will have the money today. Here in [facility] short stay for Ocrevus infusion. Sent Dr. Anne Hahn basket message via computer as before office opened to ask if okay for him to have Ocrevus today due to above. MD replied if no temperature fine to proceed. Afebrile therefore informed Dr. Anne Hahn will give him Ocrevus per order. Patient aware of all of above.

## 2017-08-25 NOTE — Discharge Instructions (Signed)
Call 911 for emergencies  Call Md for problems or questions     Ocrelizumab injection What is this medicine? OCRELIZUMAB (ok re LIZ ue mab) treats multiple sclerosis. It helps to decrease the number of multiple sclerosis relapses. It is not a cure. This medicine may be used for other purposes; ask your health care provider or pharmacist if you have questions. COMMON BRAND NAME(S): OCREVUS What should I tell my health care provider before I take this medicine? They need to know if you have any of these conditions: -cancer -hepatitis B infection -other infection (especially a virus infection such as chickenpox, cold sores, or herpes) -an unusual or allergic reaction to ocrelizumab, other medicines, foods, dyes or preservatives -pregnant or trying to get pregnant -breast-feeding How should I use this medicine? This medicine is for infusion into a vein. It is given by a health care professional in a hospital or clinic setting. Talk to your pediatrician regarding the use of this medicine in children. Special care may be needed. Overdosage: If you think you have taken too much of this medicine contact a poison control center or emergency room at once. NOTE: This medicine is only for you. Do not share this medicine with others. What if I miss a dose? Keep appointments for follow-up doses as directed. It is important not to miss your dose. Call your doctor or health care professional if you are unable to keep an appointment. What may interact with this medicine? -alemtuzumab -daclizumab -dimethyl fumarate -fingolimod -glatiramer -interferon beta -live virus vaccines -mitoxantrone -natalizumab -peginterferon beta -rituximab -steroid medicines like prednisone or cortisone -teriflunomide This list may not describe all possible interactions. Give your health care provider a list of all the medicines, herbs, non-prescription drugs, or dietary supplements you use. Also tell them if you  smoke, drink alcohol, or use illegal drugs. Some items may interact with your medicine. What should I watch for while using this medicine? Tell your doctor or healthcare professional if your symptoms do not start to get better or if they get worse. This medicine can cause serious allergic reactions. To reduce your risk you may need to take medicine before treatment with this medicine. Take your medicine as directed. Women should inform their doctor if they wish to become pregnant or think they might be pregnant. There is a potential for serious side effects to an unborn child. Talk to your health care professional or pharmacist for more information. Male patients should use effective birth control methods while receiving this medicine and for 6 months after the last dose. Call your doctor or health care professional for advice if you get a fever, chills or sore throat, or other symptoms of a cold or flu. Do not treat yourself. This drug decreases your body's ability to fight infections. Try to avoid being around people who are sick. If you have a hepatitis B infection or a history of a hepatitis B infection, talk to your doctor. The symptoms of hepatitis B may get worse if you take this medicine. In some patients, this medicine may cause a serious brain infection that may cause death. If you have any problems seeing, thinking, speaking, walking, or standing, tell your doctor right away. If you cannot reach your doctor, urgently seek other source of medical care. This medicine can decrease the response to a vaccine. If you need to get vaccinated, tell your healthcare professional if you have received this medicine. Extra booster doses may be needed. Talk to your doctor to see if  a different vaccination schedule is needed. Talk to your doctor about your risk of cancer. You may be more at risk for certain types of cancers if you take this medicine. What side effects may I notice from receiving this  medicine? Side effects that you should report to your doctor or health care professional as soon as possible: -allergic reactions like skin rash, itching or hives, swelling of the face, lips, or tongue -breathing problems -facial flushing -fast, irregular heartbeat -lump or soreness in the breast -signs and symptoms of herpes such as cold sore, shingles, or genital sores -signs and symptoms of infection like fever or chills, cough, sore throat, pain or trouble passing urine -signs and symptoms of low blood pressure like dizziness; feeling faint or lightheaded, falls; unusually weak or tired -signs and symptoms of progressive multifocal leukoencephalopathy (PML) like changes in vision; clumsiness; confusion; personality changes; weakness on one side of the body -swelling of the ankles, feet, hands Side effects that usually do not require medical attention (report these to your doctor or health care professional if they continue or are bothersome): -back pain -depressed mood -diarrhea -pain, redness, or irritation at site where injected This list may not describe all possible side effects. Call your doctor for medical advice about side effects. You may report side effects to FDA at 1-800-FDA-1088. Where should I keep my medicine? This drug is given in a hospital or clinic and will not be stored at home. NOTE: This sheet is a summary. It may not cover all possible information. If you have questions about this medicine, talk to your doctor, pharmacist, or health care provider.  2018 Elsevier/Gold Standard (2015-07-30 09:40:25)

## 2017-08-26 ENCOUNTER — Inpatient Hospital Stay: Admission: RE | Admit: 2017-08-26 | Payer: Medicaid Other | Source: Ambulatory Visit

## 2017-08-30 ENCOUNTER — Ambulatory Visit (INDEPENDENT_AMBULATORY_CARE_PROVIDER_SITE_OTHER): Payer: Medicaid Other | Admitting: Orthopaedic Surgery

## 2017-08-30 ENCOUNTER — Ambulatory Visit (INDEPENDENT_AMBULATORY_CARE_PROVIDER_SITE_OTHER): Payer: Medicaid Other

## 2017-08-30 ENCOUNTER — Encounter (INDEPENDENT_AMBULATORY_CARE_PROVIDER_SITE_OTHER): Payer: Self-pay | Admitting: Orthopaedic Surgery

## 2017-08-30 DIAGNOSIS — S42031A Displaced fracture of lateral end of right clavicle, initial encounter for closed fracture: Secondary | ICD-10-CM | POA: Diagnosis not present

## 2017-08-30 DIAGNOSIS — M25511 Pain in right shoulder: Secondary | ICD-10-CM | POA: Diagnosis not present

## 2017-08-30 MED ORDER — HYDROCODONE-ACETAMINOPHEN 5-325 MG PO TABS: 1.0000 | ORAL_TABLET | Freq: Four times a day (QID) | ORAL | 0 refills | Status: DC | PRN
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
## 2017-09-02 ENCOUNTER — Other Ambulatory Visit (INDEPENDENT_AMBULATORY_CARE_PROVIDER_SITE_OTHER): Payer: Self-pay | Admitting: Orthopaedic Surgery

## 2017-09-02 ENCOUNTER — Telehealth (INDEPENDENT_AMBULATORY_CARE_PROVIDER_SITE_OTHER): Payer: Self-pay | Admitting: Orthopaedic Surgery

## 2017-09-02 MED ORDER — HYDROCODONE-ACETAMINOPHEN 5-325 MG PO TABS: 1.0000 | ORAL_TABLET | Freq: Four times a day (QID) | ORAL | 0 refills | Status: DC | PRN

## 2017-09-02 NOTE — Telephone Encounter (Signed)
I actually E scribe some in to his pharmacy today.

## 2017-09-02 NOTE — Telephone Encounter (Signed)
Rx issue/request
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
## 2017-09-03 ENCOUNTER — Inpatient Hospital Stay: Admission: RE | Admit: 2017-09-03 | Payer: Medicaid Other | Source: Ambulatory Visit

## 2017-09-23 ENCOUNTER — Other Ambulatory Visit (INDEPENDENT_AMBULATORY_CARE_PROVIDER_SITE_OTHER): Payer: Self-pay | Admitting: Orthopaedic Surgery

## 2017-09-23 ENCOUNTER — Telehealth (INDEPENDENT_AMBULATORY_CARE_PROVIDER_SITE_OTHER): Payer: Self-pay | Admitting: Orthopaedic Surgery

## 2017-09-23 MED ORDER — HYDROCODONE-ACETAMINOPHEN 5-325 MG PO TABS: 1.0000 | ORAL_TABLET | Freq: Four times a day (QID) | ORAL | 0 refills | Status: DC | PRN

## 2017-09-23 NOTE — Telephone Encounter (Signed)
I sent some in to [facility].

## 2017-09-23 NOTE — Telephone Encounter (Signed)
Patient aware this was sent to pharmacy  

## 2017-09-23 NOTE — Telephone Encounter (Signed)
Patient called for refill on Hydrocodone.  Please call patient to advise  [phone]

## 2017-09-23 NOTE — Telephone Encounter (Signed)
Please advise 

## 2017-09-30 ENCOUNTER — Ambulatory Visit (INDEPENDENT_AMBULATORY_CARE_PROVIDER_SITE_OTHER): Payer: Medicaid Other | Admitting: Orthopaedic Surgery

## 2017-09-30 ENCOUNTER — Encounter (INDEPENDENT_AMBULATORY_CARE_PROVIDER_SITE_OTHER): Payer: Self-pay | Admitting: Orthopaedic Surgery

## 2017-09-30 ENCOUNTER — Ambulatory Visit (INDEPENDENT_AMBULATORY_CARE_PROVIDER_SITE_OTHER): Payer: Medicaid Other

## 2017-09-30 DIAGNOSIS — M25511 Pain in right shoulder: Secondary | ICD-10-CM

## 2017-09-30 MED ORDER — HYDROCODONE-ACETAMINOPHEN 5-325 MG PO TABS: 1.0000 | ORAL_TABLET | Freq: Four times a day (QID) | ORAL | 0 refills | Status: DC | PRN
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
## 2017-10-11 ENCOUNTER — Telehealth (INDEPENDENT_AMBULATORY_CARE_PROVIDER_SITE_OTHER): Payer: Self-pay | Admitting: Orthopaedic Surgery

## 2017-10-11 NOTE — Telephone Encounter (Signed)
Darlene with [employer] faxed over an opinion letter and has not received anything back.  Fax # [phone]  phone # [phone]

## 2017-10-12 NOTE — Telephone Encounter (Signed)
Have you seen this ?

## 2017-10-13 NOTE — Telephone Encounter (Signed)
Faxed 6/18 at 12:00p

## 2017-10-26 ENCOUNTER — Ambulatory Visit (INDEPENDENT_AMBULATORY_CARE_PROVIDER_SITE_OTHER): Payer: Medicaid Other | Admitting: Orthopaedic Surgery

## 2017-10-26 ENCOUNTER — Ambulatory Visit (INDEPENDENT_AMBULATORY_CARE_PROVIDER_SITE_OTHER): Payer: Medicaid Other

## 2017-10-26 ENCOUNTER — Encounter (INDEPENDENT_AMBULATORY_CARE_PROVIDER_SITE_OTHER): Payer: Self-pay | Admitting: Orthopaedic Surgery

## 2017-10-26 DIAGNOSIS — M25511 Pain in right shoulder: Secondary | ICD-10-CM

## 2017-10-26 DIAGNOSIS — S42031A Displaced fracture of lateral end of right clavicle, initial encounter for closed fracture: Secondary | ICD-10-CM

## 2017-10-26 DIAGNOSIS — G8929 Other chronic pain: Secondary | ICD-10-CM

## 2017-10-26 MED ORDER — HYDROCODONE-ACETAMINOPHEN 5-325 MG PO TABS: 1.0000 | ORAL_TABLET | Freq: Four times a day (QID) | ORAL | 0 refills | Status: DC | PRN
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
## 2017-11-24 ENCOUNTER — Ambulatory Visit (INDEPENDENT_AMBULATORY_CARE_PROVIDER_SITE_OTHER): Payer: Medicaid Other

## 2017-11-24 ENCOUNTER — Ambulatory Visit (INDEPENDENT_AMBULATORY_CARE_PROVIDER_SITE_OTHER): Payer: Medicaid Other | Admitting: Orthopaedic Surgery

## 2017-11-24 ENCOUNTER — Encounter (INDEPENDENT_AMBULATORY_CARE_PROVIDER_SITE_OTHER): Payer: Self-pay | Admitting: Orthopaedic Surgery

## 2017-11-24 DIAGNOSIS — S42031D Displaced fracture of lateral end of right clavicle, subsequent encounter for fracture with routine healing: Secondary | ICD-10-CM

## 2017-11-24 MED ORDER — HYDROCODONE-ACETAMINOPHEN 5-325 MG PO TABS: 1.0000 | ORAL_TABLET | Freq: Four times a day (QID) | ORAL | 0 refills | Status: DC | PRN
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
## 2018-02-14 ENCOUNTER — Encounter: Payer: Self-pay | Admitting: Adult Health

## 2018-02-14 ENCOUNTER — Ambulatory Visit: Payer: Medicaid Other | Admitting: Adult Health

## 2018-02-14 VITALS — BP 128/76 | HR 57 | Ht 73.5 in | Wt 197.2 lb

## 2018-02-14 DIAGNOSIS — Z5181 Encounter for therapeutic drug level monitoring: Secondary | ICD-10-CM

## 2018-02-14 DIAGNOSIS — G35 Multiple sclerosis: Secondary | ICD-10-CM | POA: Diagnosis not present

## 2018-02-14 NOTE — Patient Instructions (Signed)
Your Plan:  Continue Ocrevus Blood work today Schedule MRI If your symptoms worsen or you develop new symptoms please let us know.    Thank you for coming to see [place] at [facility]. I hope we have been able to provide you high quality care today.  You may receive a patient satisfaction survey over the next few weeks. We would appreciate your feedback and comments so that we may continue to improve ourselves and the health of our patients.
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
## 2018-02-15 ENCOUNTER — Telehealth: Payer: Self-pay | Admitting: *Deleted

## 2018-02-15 LAB — COMPREHENSIVE METABOLIC PANEL
ALBUMIN: 4.4 g/dL (ref 3.5–5.5)
ALT: 16 IU/L (ref 0–44)
AST: 17 IU/L (ref 0–40)
Albumin/Globulin Ratio: 1.6 (ref 1.2–2.2)
Alkaline Phosphatase: 60 IU/L (ref 39–117)
BILIRUBIN TOTAL: 0.5 mg/dL (ref 0.0–1.2)
BUN / CREAT RATIO: 13 (ref 9–20)
BUN: 16 mg/dL (ref 6–24)
CALCIUM: 9.6 mg/dL (ref 8.7–10.2)
CO2: 22 mmol/L (ref 20–29)
CREATININE: 1.26 mg/dL (ref 0.76–1.27)
Chloride: 109 mmol/L — ABNORMAL HIGH (ref 96–106)
GFR, EST AFRICAN AMERICAN: 74 mL/min/{1.73_m2} (ref >59)
GFR, EST NON AFRICAN AMERICAN: 64 mL/min/{1.73_m2} (ref >59)
GLUCOSE: 76 mg/dL (ref 65–99)
Globulin, Total: 2.7 g/dL (ref 1.5–4.5)
Potassium: 4.3 mmol/L (ref 3.5–5.2)
Sodium: 144 mmol/L (ref 134–144)
TOTAL PROTEIN: 7.1 g/dL (ref 6.0–8.5)

## 2018-02-15 LAB — CBC WITH DIFFERENTIAL/PLATELET
BASOS ABS: 0.1 10*3/uL (ref 0.0–0.2)
BASOS: 2 %
EOS (ABSOLUTE): 0 10*3/uL (ref 0.0–0.4)
EOS: 1 %
HEMATOCRIT: 40.6 % (ref 37.5–51.0)
HEMOGLOBIN: 13.8 g/dL (ref 13.0–17.7)
Immature Grans (Abs): 0 10*3/uL (ref 0.0–0.1)
Immature Granulocytes: 0 %
LYMPHS ABS: 1.7 10*3/uL (ref 0.7–3.1)
LYMPHS: 48 %
MCH: 31.1 pg (ref 26.6–33.0)
MCHC: 34 g/dL (ref 31.5–35.7)
MCV: 91 fL (ref 79–97)
MONOCYTES: 10 %
Monocytes Absolute: 0.3 10*3/uL (ref 0.1–0.9)
NEUTROS ABS: 1.3 10*3/uL — AB (ref 1.4–7.0)
Neutrophils: 39 %
Platelets: 209 10*3/uL (ref 150–450)
RBC: 4.44 x10E6/uL (ref 4.14–5.80)
RDW: 12.5 % (ref 12.3–15.4)
WBC: 3.4 10*3/uL (ref 3.4–10.8)

## 2018-02-15 NOTE — Telephone Encounter (Signed)
Spoke with patient and informed him his blood work is unremarkable. He verbalized understanding, appreciation.

## 2018-02-22 ENCOUNTER — Other Ambulatory Visit (HOSPITAL_COMMUNITY): Payer: Self-pay | Admitting: General Practice

## 2018-02-22 ENCOUNTER — Other Ambulatory Visit: Payer: Self-pay | Admitting: Neurology

## 2018-02-22 DIAGNOSIS — G35 Multiple sclerosis: Secondary | ICD-10-CM

## 2018-02-24 ENCOUNTER — Other Ambulatory Visit: Payer: Self-pay | Admitting: Adult Health

## 2018-02-25 MED FILL — OCREVUS 300 MG/10ML SOLN: 300 | 30 days supply | Qty: 20 | Fill #1

## 2018-02-26 ENCOUNTER — Ambulatory Visit
Admission: RE | Admit: 2018-02-26 | Discharge: 2018-02-26 | Disposition: A | Discharge status: Home / Self Care | Payer: Medicaid Other | Source: Ambulatory Visit | Attending: Adult Health | Admitting: Adult Health

## 2018-02-26 DIAGNOSIS — G35 Multiple sclerosis: Secondary | ICD-10-CM

## 2018-02-26 MED ORDER — GADOBENATE DIMEGLUMINE 529 MG/ML IV SOLN
18.0000 mL | Freq: Once | INTRAVENOUS | Status: AC | PRN
  Administered 2018-02-26: 18 mL via INTRAVENOUS

## 2018-02-28 ENCOUNTER — Telehealth: Payer: Self-pay | Admitting: *Deleted

## 2018-02-28 NOTE — Telephone Encounter (Signed)
Spoke with patient and informed him is MRI brain showed no significant change when compared to his previous scan Dec 2017. He verbalized understanding, appreciation.

## 2018-03-02 ENCOUNTER — Ambulatory Visit (HOSPITAL_COMMUNITY)
Admission: RE | Admit: 2018-03-02 | Discharge: 2018-03-02 | Disposition: A | Discharge status: Home / Self Care | Payer: Medicaid Other | Source: Ambulatory Visit | Attending: Neurology | Admitting: Neurology

## 2018-03-02 ENCOUNTER — Encounter (HOSPITAL_COMMUNITY): Payer: Self-pay

## 2018-03-02 DIAGNOSIS — G35 Multiple sclerosis: Secondary | ICD-10-CM | POA: Insufficient documentation

## 2018-03-02 MED ORDER — SODIUM CHLORIDE 0.9 % IV SOLN
INTRAVENOUS | Status: DC
  Administered 2018-03-02: 08:00:00 via INTRAVENOUS

## 2018-03-02 MED ORDER — DIPHENHYDRAMINE HCL 25 MG PO CAPS
50.0000 mg | ORAL_CAPSULE | Freq: Once | ORAL | Status: AC
  Administered 2018-03-02: 50 mg via ORAL
  Filled 2018-03-02: qty 2

## 2018-03-02 MED ORDER — METHYLPREDNISOLONE SODIUM SUCC 125 MG IJ SOLR
125.0000 mg | Freq: Once | INTRAMUSCULAR | Status: AC
  Administered 2018-03-02: 125 mg via INTRAVENOUS
  Filled 2018-03-02: qty 2

## 2018-03-02 MED ORDER — SODIUM CHLORIDE 0.9 % IV SOLN
600.0000 mg | Freq: Once | INTRAVENOUS | Status: AC
  Administered 2018-03-02: 600 mg via INTRAVENOUS
  Filled 2018-03-02: qty 20

## 2018-03-02 NOTE — Progress Notes (Signed)
Ocrevus 600mg  IV given today.  Pt tolerated well and stayed the required 1 hour observation post infusion.  Pt given next appointment for May 2020.  Pt d/c via his motorized wheelchair to lobby.

## 2018-05-04 ENCOUNTER — Telehealth: Payer: Self-pay | Admitting: Neurology

## 2018-05-04 DIAGNOSIS — R269 Unspecified abnormalities of gait and mobility: Secondary | ICD-10-CM

## 2018-05-04 DIAGNOSIS — G35 Multiple sclerosis: Secondary | ICD-10-CM

## 2018-05-04 NOTE — Telephone Encounter (Signed)
Pt is calling wanting to know if he can receive PT to help him walk better. Please advise.

## 2018-05-04 NOTE — Telephone Encounter (Signed)
Called patient to discuss. He reported that he is experiencing more muscle spasms and weakness in his legs. He denies any recent falls. He has done PT in past at [facility] and would like to have PT there again if NP agrees. This RN stated will discuss with her and only call him back if she has other advice.  He also stated he continues to have a problem with sleeping. He has tried Tylenol PM without success. He falls asleep alright but wakes and has difficulty falling back to sleep or wakes early. This RN suggested he try Melatonin.  This RN advised if NP agrees to order PT, he will get a call from rehab to schedule. Patient verbalized understanding, appreciation.

## 2018-05-05 NOTE — Telephone Encounter (Signed)
Orders placed.

## 2018-05-26 ENCOUNTER — Encounter: Payer: Self-pay | Admitting: Physical Therapy

## 2018-05-26 ENCOUNTER — Other Ambulatory Visit: Payer: Self-pay

## 2018-05-26 ENCOUNTER — Ambulatory Visit: Payer: Medicaid Other | Attending: Family Medicine | Admitting: Physical Therapy

## 2018-05-26 DIAGNOSIS — M21371 Foot drop, right foot: Secondary | ICD-10-CM | POA: Insufficient documentation

## 2018-05-26 DIAGNOSIS — M6281 Muscle weakness (generalized): Secondary | ICD-10-CM | POA: Insufficient documentation

## 2018-05-26 DIAGNOSIS — R2689 Other abnormalities of gait and mobility: Secondary | ICD-10-CM | POA: Insufficient documentation

## 2018-05-26 DIAGNOSIS — Z9181 History of falling: Secondary | ICD-10-CM | POA: Insufficient documentation

## 2018-05-26 DIAGNOSIS — M21372 Foot drop, left foot: Secondary | ICD-10-CM | POA: Diagnosis present
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
## 2018-05-26 NOTE — Patient Instructions (Signed)
Access Code: [email or web address]  URL: [email or web address]  Date: 05/26/2018  Prepared by: Veda Canning   Exercises  Seated Long Arc Quad - 10 reps - 1 sets - 5 counts hold - 1-2x daily - 5x weekly  Seated Leg Extension with Resistance - AFO - 10 reps - 1 sets - - hold - 1-2x daily - 5x weekly

## 2018-06-03 ENCOUNTER — Ambulatory Visit: Payer: Medicaid Other | Admitting: Physical Therapy

## 2018-06-10 ENCOUNTER — Ambulatory Visit: Payer: Medicaid Other | Admitting: Physical Therapy

## 2018-06-17 ENCOUNTER — Ambulatory Visit: Payer: Medicaid Other | Admitting: Physical Therapy

## 2018-06-24 ENCOUNTER — Ambulatory Visit: Payer: Medicaid Other | Admitting: Physical Therapy

## 2018-08-10 ENCOUNTER — Other Ambulatory Visit (HOSPITAL_COMMUNITY): Payer: Self-pay | Admitting: General Practice

## 2018-08-24 ENCOUNTER — Telehealth: Payer: Self-pay

## 2018-08-24 ENCOUNTER — Other Ambulatory Visit: Payer: Self-pay | Admitting: Neurology

## 2018-08-24 DIAGNOSIS — G35 Multiple sclerosis: Secondary | ICD-10-CM

## 2018-08-24 NOTE — Telephone Encounter (Signed)
I contacted [place] [employer] and spoke with [facility].  Pa for ocrevus exp on 08/04/18. I have resubmitted the reauthorization.  PA ref # [id number] Call ref # [id number]  Determination should be reached within 24 hours.

## 2018-08-25 NOTE — Telephone Encounter (Signed)
PA for ocrevus has been approved through [facility].   Pt member ID [id number] PA effective dates: 08/24/2018-08/19/2019.  Dee with [facility] has been notified of approval via staff message and Dr. Anne Hahn has placed Ocrevus orders in epic.

## 2018-08-31 ENCOUNTER — Telehealth: Payer: Self-pay | Admitting: Neurology

## 2018-08-31 NOTE — Telephone Encounter (Signed)
I contacted the pt and completed the pre charting for 09/01/18 visit.  Pt stated he had not received the link for tomorrow visit and I have re sent to this mobile device ( # [phone])

## 2018-08-31 NOTE — Telephone Encounter (Signed)
Due to current COVID 19 pandemic, our office is severely reducing in office visits for at least the next 2 weeks, in order to minimize the risk to our patients and healthcare providers. Pt understands that although there may be some limitations with this type of visit, we will take all precautions to reduce any security or privacy concerns.  Pt understands that this will be treated like an in office visit and we will file with pt's insurance, and there may be a patient responsible charge related to this service.  Email sent to [email or web address]

## 2018-09-01 ENCOUNTER — Other Ambulatory Visit: Payer: Self-pay

## 2018-09-01 ENCOUNTER — Ambulatory Visit (INDEPENDENT_AMBULATORY_CARE_PROVIDER_SITE_OTHER): Payer: Medicaid Other | Admitting: Neurology

## 2018-09-01 ENCOUNTER — Encounter: Payer: Self-pay | Admitting: Neurology

## 2018-09-01 DIAGNOSIS — R269 Unspecified abnormalities of gait and mobility: Secondary | ICD-10-CM | POA: Diagnosis not present

## 2018-09-01 DIAGNOSIS — G35 Multiple sclerosis: Secondary | ICD-10-CM

## 2018-09-01 DIAGNOSIS — Z5181 Encounter for therapeutic drug level monitoring: Secondary | ICD-10-CM | POA: Diagnosis not present

## 2018-09-01 DIAGNOSIS — G8250 Quadriplegia, unspecified: Secondary | ICD-10-CM

## 2018-09-01 MED ORDER — GABAPENTIN 300 MG PO CAPS: 300.0000 mg | ORAL_CAPSULE | Freq: Two times a day (BID) | ORAL | 3 refills | Status: DC

## 2018-09-01 MED ORDER — BACLOFEN 20 MG PO TABS: 20.0000 mg | ORAL_TABLET | Freq: Three times a day (TID) | ORAL | 3 refills | Status: DC

## 2018-09-01 NOTE — Progress Notes (Signed)
     Virtual Visit via Video Note  I connected with [name] on 09/01/18 at 11:00 AM EDT by a video enabled telemedicine application and verified that I am speaking with the correct person using two identifiers.  Location: Patient: The patient is at home. Provider: Physician in office.   I discussed the limitations of evaluation and management by telemedicine and the availability of in person appointments. The patient expressed understanding and agreed to proceed.  History of Present Illness: [name] is a 53 year old right-handed black male with a history of multiple sclerosis with a spastic quadriparesis.  The patient has a gait disorder, he mainly mobilizes using a wheelchair, but he does have a walker.  He indicates that he is able to walk 1-2 blocks with a walker.  He reports no recent falls.  The patient has been on Ocrevus, he has tolerated the medication well.  His next dose is tomorrow.  The patient has not had any new numbness or weakness of the face, arms, or legs.  He denies any change in bowel or bladder control.  He reports no visual changes.  He has not been seen by an ophthalmologist recently.  The patient has had MRI of the brain done in the fall 2019, this was stable from 2 years prior, this shows extensive white matter changes.  The patient does have a prior history of cocaine abuse.  He otherwise has not noted any new medical issues that have come up.  He continues to have some problems with spasticity of the legs, he is on baclofen.  He reports discomfort with the legs that is not always related to spasms.  He wakes up 2-3 times at night, not always related to pain.   Observations/Objective: The virtual video evaluation failed, could not make full connection.  The evaluation was done mainly over the telephone.  The patient appears to be alert and cooperative, speech is well enunciated, not aphasic or dysarthric.  On the brief video evaluation, the face appeared to  be symmetric, full extraocular movements are seen.  Assessment and Plan: 1.  Multiple sclerosis  2.  Spastic quadriparesis  3.  Gait disorder  4.  Bilateral leg discomfort  The patient will be placed on gabapentin taking 300 mg twice daily, he will call for any dose adjustments.  The patient will be given a prescription for the baclofen.  He will follow-up here in 6 months, he will come in for blood work on the [employer].  Follow Up Instructions: 66-month follow-up, may see nurse practitioner.   I discussed the assessment and treatment plan with the patient. The patient was provided an opportunity to ask questions and all were answered. The patient agreed with the plan and demonstrated an understanding of the instructions.   The patient was advised to call back or seek an in-person evaluation if the symptoms worsen or if the condition fails to improve as anticipated.  I provided 25 minutes of non-face-to-face time during this encounter.   York Spaniel, MD

## 2018-09-02 ENCOUNTER — Other Ambulatory Visit: Payer: Self-pay

## 2018-09-02 ENCOUNTER — Ambulatory Visit (HOSPITAL_COMMUNITY): Payer: Medicaid Other

## 2018-09-02 ENCOUNTER — Encounter (HOSPITAL_COMMUNITY): Payer: Self-pay

## 2018-09-02 ENCOUNTER — Ambulatory Visit (HOSPITAL_COMMUNITY)
Admission: RE | Admit: 2018-09-02 | Discharge: 2018-09-02 | Disposition: A | Discharge status: Home / Self Care | Payer: Medicaid Other | Source: Ambulatory Visit | Attending: Neurology | Admitting: Neurology

## 2018-09-02 DIAGNOSIS — G35 Multiple sclerosis: Secondary | ICD-10-CM | POA: Insufficient documentation

## 2018-09-02 MED ORDER — SODIUM CHLORIDE 0.9 % IV SOLN
600.0000 mg | Freq: Once | INTRAVENOUS | Status: AC
  Administered 2018-09-02: 08:00:00 600 mg via INTRAVENOUS
  Filled 2018-09-02: qty 20

## 2018-09-02 MED ORDER — METHYLPREDNISOLONE SODIUM SUCC 125 MG IJ SOLR
INTRAMUSCULAR | Status: AC
  Administered 2018-09-02: 100 mg via INTRAVENOUS
  Filled 2018-09-02: qty 2

## 2018-09-02 MED ORDER — METHYLPREDNISOLONE SODIUM SUCC 125 MG IJ SOLR
100.0000 mg | Freq: Once | INTRAMUSCULAR | Status: AC
  Administered 2018-09-02: 08:00:00 100 mg via INTRAVENOUS

## 2018-09-02 MED ORDER — SODIUM CHLORIDE 0.9 % IV SOLN
INTRAVENOUS | Status: DC
  Administered 2018-09-02: 08:00:00 via INTRAVENOUS

## 2018-09-02 MED ORDER — ACETAMINOPHEN 325 MG PO TABS
ORAL_TABLET | ORAL | Status: AC
  Administered 2018-09-02: 650 mg via ORAL
  Filled 2018-09-02: qty 2

## 2018-09-02 MED ORDER — DIPHENHYDRAMINE HCL 50 MG/ML IJ SOLN
25.0000 mg | Freq: Once | INTRAMUSCULAR | Status: AC
  Administered 2018-09-02: 08:00:00 25 mg via INTRAVENOUS

## 2018-09-02 MED ORDER — ACETAMINOPHEN 325 MG PO TABS
650.0000 mg | ORAL_TABLET | Freq: Once | ORAL | Status: AC
  Administered 2018-09-02: 08:00:00 650 mg via ORAL

## 2018-09-02 MED ORDER — DIPHENHYDRAMINE HCL 50 MG/ML IJ SOLN
INTRAMUSCULAR | Status: AC
  Administered 2018-09-02: 25 mg via INTRAVENOUS
  Filled 2018-09-02: qty 1

## 2018-09-02 NOTE — Progress Notes (Signed)
Ocrevus 600mg  infusion completed.  Pt stayed for the 1 hour observation post infusion. No reaction was noted.  Pt given next appointment for November 10 at 0800.  Pt d/c to lobby via his motorized wheelchair.

## 2018-12-22 ENCOUNTER — Other Ambulatory Visit: Payer: Self-pay | Admitting: Neurology

## 2019-02-09 ENCOUNTER — Other Ambulatory Visit: Payer: Self-pay | Admitting: Neurology

## 2019-02-13 MED FILL — OCREVUS 300 MG/10ML SOLN: 300 | 34 days supply | Qty: 20 | Fill #0

## 2019-02-27 ENCOUNTER — Other Ambulatory Visit: Payer: Self-pay | Admitting: Neurology

## 2019-02-27 ENCOUNTER — Telehealth: Payer: Self-pay | Admitting: Neurology

## 2019-02-27 DIAGNOSIS — G35 Multiple sclerosis: Secondary | ICD-10-CM

## 2019-02-27 NOTE — Telephone Encounter (Signed)
Dr. Krista Blue- This is a Dr. Jannifer Franklin pt. Can you please place new Ocrevus orders? Thank you

## 2019-02-27 NOTE — Telephone Encounter (Signed)
I have put in the order for   Ocrelizumab 600mg   Benadryl 50mg  prn Tylenol 650mg  prn.

## 2019-02-27 NOTE — Telephone Encounter (Signed)
Noted  

## 2019-02-27 NOTE — Telephone Encounter (Signed)
Mendel Ryder from [facility] is calling in requesting an [place] order for pt   CB# [phone]

## 2019-03-05 NOTE — Progress Notes (Signed)
PATIENT: MOHAMADOU MACIVER DOB: 1965/10/24  REASON FOR VISIT: follow up HISTORY FROM: patient  HISTORY OF PRESENT ILLNESS: Today 03/06/19  [name] is a 53 year old male with history of multiple sclerosis with a spastic quadriparesis.  He has a gait disorder, mainly uses a wheelchair, but he does have a walker.  He remains on Ocrevus and is tolerating well.  His last MRI of the brain was done in fall 2019, showed stability from 2 years prior, it showed extensive white matter changes.  He has chronic issues with spasticity of his legs, he remains on baclofen.  After his last visit he complained of bilateral leg pain, he was started on gabapentin 300 mg twice a day.  He feels he has remained stable on Ocrevus.  His next infusion is tomorrow at [facility].  He lives alone, and indicates he is able to do most things for himself.  He is able to stand and walk with a walker.  He says he can walk a couple blocks if he took his time.  He denies any new numbness or weakness in his arms or legs.  He says he may have a feeling of vibration to the bottom of his feet.  He denies changes to his bowels or bladder.  He says he also has urinary urgency, if waits to use the bathroom.  He continues to notice stiffness of his legs, muscle spasms.  He was brought to this appointment by transportation.  He presents today for follow-up unaccompanied.  HISTORY 09/01/2018 Dr. Anne Hahn: Desmon Hitchner is a 53 year old right-handed black male with a history of multiple sclerosis with a spastic quadriparesis.  The patient has a gait disorder, he mainly mobilizes using a wheelchair, but he does have a walker.  He indicates that he is able to walk 1-2 blocks with a walker.  He reports no recent falls.  The patient has been on Ocrevus, he has tolerated the medication well.  His next dose is tomorrow.  The patient has not had any new numbness or weakness of the face, arms, or legs.  He denies any change in bowel or bladder control.   He reports no visual changes.  He has not been seen by an ophthalmologist recently.  The patient has had MRI of the brain done in the fall 2019, this was stable from 2 years prior, this shows extensive white matter changes.  The patient does have a prior history of cocaine abuse.  He otherwise has not noted any new medical issues that have come up.  He continues to have some problems with spasticity of the legs, he is on baclofen.  He reports discomfort with the legs that is not always related to spasms.  He wakes up 2-3 times at night, not always related to pain.   REVIEW OF SYSTEMS: Out of a complete 14 system review of symptoms, the patient complains only of the following symptoms, and all other reviewed systems are negative.  numbness  ALLERGIES: Allergies  Allergen Reactions  . Morphine And Related Hives  . Morphine And Related Hives    HOME MEDICATIONS: Outpatient Medications Prior to Visit  Medication Sig Dispense Refill  . baclofen (LIORESAL) 20 MG tablet Take 1 tablet (20 mg total) by mouth 3 (three) times daily. 270 tablet 3  . FLUoxetine (PROZAC) 40 MG capsule Take 40 mg by mouth every morning.    . gabapentin (NEURONTIN) 300 MG capsule TAKE 1 CAPSULE (300 MG TOTAL) BY MOUTH 2 (TWO)  TIMES DAILY. 60 capsule 3  . lisinopril-hydrochlorothiazide (PRINZIDE,ZESTORETIC) 20-12.5 MG tablet Take 1 tablet by mouth every morning.     . OCREVUS 300 MG/10ML injection INJECT 20 ML (600 MG) IV EVERY 6 MONTHS 20 mL 1  . traZODone (DESYREL) 150 MG tablet Take 75 mg by mouth at bedtime.     Marland Kitchen. acetaminophen (TYLENOL) 500 MG tablet Take 1,000 mg by mouth every 6 (six) hours as needed.    . montelukast (SINGULAIR) 10 MG tablet Take 10 mg by mouth every morning.     . tamsulosin (FLOMAX) 0.4 MG CAPS capsule Take 1 capsule (0.4 mg total) by mouth daily. (Patient not taking: Reported on 03/06/2019) 30 capsule 1  . HYDROcodone-acetaminophen (NORCO/VICODIN) 5-325 MG tablet Take 1-2 tablets by mouth every 6  (six) hours as needed for up to 10 doses. 40 tablet 0   No facility-administered medications prior to visit.     PAST MEDICAL HISTORY: Past Medical History:  Diagnosis Date  . Abnormality of gait 11/28/2015  . BPH (benign prostatic hyperplasia)   . Depression   . Environmental and seasonal allergies   . History of hiatal hernia   . Hypertension   . Multiple sclerosis (HCC)   . 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 APPENDECTOMY    . CLOSED REDUCTION NASAL FRACTURE  10-08-2004   and Septoplasty  . HERNIA REPAIR     pt states has had hiatal hernia repair   . HERNIA REPAIR    . left ankle surgery     . LUMBAR DISC SURGERY  11-02-2000   L4 -- L5  . LUMBAR DISC SURGERY  2002  . ORIF ANKLE FRACTURE Right 2008  . ORIF HUMERUS FRACTURE Right 2008  . ORIF RIGHT PROXIMAL HUMERUS FX AND ORIF RIGHT ANKLE FX  01-27-2007  . RIGHT SHOULDER MANIPULATION WITH HARDWARE REMOVAL/  RIGHT TIBIA NONUNION SITE  HARDWARE REMOVAL AND EXCHANGED AND BONE GRAFT  AND ALLOGRAFT  05-10-2007  . SEPTOPLASTY  2006  . SHOULDER SURGERY Right 2009  . TRANSURETHRAL RESECTION OF PROSTATE N/A 10/28/2015   Procedure: TRANSURETHRAL RESECTION OF THE PROSTATE WITH GYRUS INSTRUMENTS;  Surgeon: Malen GauzePatrick L McKenzie, MD;  Location: [facility];  Service: Urology;  Laterality: N/A;  . TRANSURETHRAL RESECTION OF PROSTATE  10/28/2015    FAMILY HISTORY: Family History  Problem Relation Age of Onset  . Colon cancer Mother     SOCIAL HISTORY: Social History   Socioeconomic History  . Marital status: Divorced    Spouse name: Not on file  . Number of children: 1  . Years of education: 4812  . Highest education level: Not on file  Occupational History  . Occupation: N/A  Social Needs  . Financial resource strain: Not on file  . Food insecurity    Worry: Not on file    Inability: Not on  file  . Transportation needs    Medical: Not on file    Non-medical: Not on file  Tobacco Use  . Smoking status: Current Every Day Smoker    Packs/day: 0.50    Years: 30.00    Pack years: 15.00    Types: Cigarettes  . Smokeless tobacco: Never Used  Substance and Sexual Activity  . Alcohol use: Not Currently    Frequency: Never    Comment: Drinks beer occasionally  . Drug use: Not Currently    Types: Cocaine,  Marijuana    Comment:  cocaine use--  per pt last cocaine Feb 2017  . Sexual activity: Not on file  Lifestyle  . Physical activity    Days per week: Not on file    Minutes per session: Not on file  . Stress: Not on file  Relationships  . Social Herbalist on phone: Not on file    Gets together: Not on file    Attends religious service: Not on file    Active member of club or organization: Not on file    Attends meetings of clubs or organizations: Not on file    Relationship status: Not on file  . Intimate partner violence    Fear of current or ex partner: Not on file    Emotionally abused: Not on file    Physically abused: Not on file    Forced sexual activity: Not on file  Other Topics Concern  . Not on file  Social History Narrative   ** Merged History Encounter **       Lives alone Right-handed Caffeine: 2 cups of coffee and 20 oz soda/day      PHYSICAL EXAM  Vitals:   03/06/19 1432  BP: 102/70  Pulse: (!) 50  Temp: 98.2 F (36.8 C)  TempSrc: Oral  Height: 6' 1.5" (1.867 m)   Body mass index is 25.66 kg/m.  Generalized: Well developed, in no acute distress   Neurological examination  Mentation: Alert oriented to time, place, history taking. Follows all commands speech and language fluent Cranial nerve II-XII: Pupils were equal round reactive to light. Extraocular movements were full, visual field were full on confrontational test. Facial sensation and strength were normal. Head turning and shoulder shrug  were normal and symmetric.  Motor: The motor testing reveals 5 over 5 strength of upper extremities, 3/5 in lower extremities. Good symmetric motor tone is noted throughout.  Sensory: Sensory testing is intact to soft touch on all 4 extremities. No evidence of extinction is noted.  Coordination: Cerebellar testing reveals good finger-nose-finger bilaterally, only heel to shin on the right Gait and station: In a wheelchair, is able to stand with pushoff, able to stand alone for a few seconds, then leans back toward chair.  Reflexes: Deep tendon reflexes are symmetric and normal bilaterally.   DIAGNOSTIC DATA (LABS, IMAGING, TESTING) - I reviewed patient records, labs, notes, testing and imaging myself where available.  Lab Results  Component Value Date   WBC 3.4 02/14/2018   HGB 13.8 02/14/2018   HCT 40.6 02/14/2018   MCV 91 02/14/2018   PLT 209 02/14/2018      Component Value Date/Time   NA 144 02/14/2018 1424   K 4.3 02/14/2018 1424   CL 109 (H) 02/14/2018 1424   CO2 22 02/14/2018 1424   GLUCOSE 76 02/14/2018 1424   GLUCOSE 133 (H) 08/20/2017 1204   BUN 16 02/14/2018 1424   CREATININE 1.26 02/14/2018 1424   CALCIUM 9.6 02/14/2018 1424   PROT 7.1 02/14/2018 1424   ALBUMIN 4.4 02/14/2018 1424   AST 17 02/14/2018 1424   ALT 16 02/14/2018 1424   ALKPHOS 60 02/14/2018 1424   BILITOT 0.5 02/14/2018 1424   GFRNONAA 64 02/14/2018 1424   GFRAA 74 02/14/2018 1424   No results found for: CHOL, HDL, LDLCALC, LDLDIRECT, TRIG, CHOLHDL No results found for: HGBA1C Lab Results  Component Value Date   VITAMINB12 381 03/16/2016   No results found for: TSH   ASSESSMENT AND  PLAN 53 y.o. 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  here with:  1.  Multiple sclerosis 2. Spastic quadriparesis  3. Gait disorder   He will remain on Ocrevus.  I will check routine lab work today, including CD19 and CD20.  Last MRI of the brain was done in November 2019, did not show any significant change compared to prior scan in 2017.  I will increase his baclofen to 20 mg 4 times a day.  In the future, we may need to add tizanidine to the regimen.  He will remain on gabapentin 300 mg twice a day for leg pain.  He will follow-up in 6 months or sooner if needed.  I did advise if his symptoms worsen or if he develops any new symptoms he should let us know.  I spent 25 minutes with the patient. 50% of this time was spent discussing his plan of care.   Margie Ege, AGNP-C, DNP 03/06/2019, 2:43 PM [facility] [address] [place], [place] [address] [phone]

## 2019-03-06 ENCOUNTER — Encounter: Payer: Self-pay | Admitting: Neurology

## 2019-03-06 ENCOUNTER — Ambulatory Visit: Payer: Medicaid Other | Admitting: Neurology

## 2019-03-06 ENCOUNTER — Other Ambulatory Visit: Payer: Self-pay | Admitting: Neurology

## 2019-03-06 ENCOUNTER — Other Ambulatory Visit: Payer: Self-pay

## 2019-03-06 VITALS — BP 102/70 | HR 50 | Temp 98.2°F | Ht 73.5 in

## 2019-03-06 DIAGNOSIS — G35 Multiple sclerosis: Secondary | ICD-10-CM | POA: Diagnosis not present

## 2019-03-06 DIAGNOSIS — G8250 Quadriplegia, unspecified: Secondary | ICD-10-CM | POA: Diagnosis not present

## 2019-03-06 DIAGNOSIS — Z5181 Encounter for therapeutic drug level monitoring: Secondary | ICD-10-CM

## 2019-03-06 MED ORDER — BACLOFEN 20 MG PO TABS: 20.0000 mg | ORAL_TABLET | Freq: Four times a day (QID) | ORAL | 3 refills | Status: DC

## 2019-03-06 MED ORDER — GABAPENTIN 300 MG PO CAPS: 300.0000 mg | ORAL_CAPSULE | Freq: Two times a day (BID) | ORAL | 6 refills | Status: DC

## 2019-03-06 NOTE — Progress Notes (Signed)
I have read the note, and I agree with the clinical assessment and plan.  Jaleya Pebley K Yuchen Fedor   

## 2019-03-06 NOTE — Patient Instructions (Signed)
Increase Baclofen 20 mg 4 times a day. Continue the gabapentin.

## 2019-03-07 ENCOUNTER — Other Ambulatory Visit: Payer: Self-pay

## 2019-03-07 ENCOUNTER — Ambulatory Visit (HOSPITAL_COMMUNITY)
Admission: RE | Admit: 2019-03-07 | Discharge: 2019-03-07 | Disposition: A | Discharge status: Home / Self Care | Payer: Medicaid Other | Source: Ambulatory Visit | Attending: Neurology | Admitting: Neurology

## 2019-03-07 ENCOUNTER — Encounter (HOSPITAL_COMMUNITY): Payer: Self-pay

## 2019-03-07 DIAGNOSIS — G35 Multiple sclerosis: Secondary | ICD-10-CM | POA: Diagnosis not present

## 2019-03-07 MED ORDER — ACETAMINOPHEN 325 MG PO TABS
650.0000 mg | ORAL_TABLET | Freq: Once | ORAL | Status: AC
  Administered 2019-03-07: 09:00:00 650 mg via ORAL
  Filled 2019-03-07: qty 2

## 2019-03-07 MED ORDER — METHYLPREDNISOLONE SODIUM SUCC 125 MG IJ SOLR
100.0000 mg | Freq: Once | INTRAMUSCULAR | Status: AC
  Administered 2019-03-07: 100 mg via INTRAVENOUS
  Filled 2019-03-07: qty 2

## 2019-03-07 MED ORDER — SODIUM CHLORIDE 0.9 % IV SOLN
INTRAVENOUS | Status: DC
  Administered 2019-03-07: 08:00:00 via INTRAVENOUS

## 2019-03-07 MED ORDER — DIPHENHYDRAMINE HCL 50 MG/ML IJ SOLN
25.0000 mg | Freq: Once | INTRAMUSCULAR | Status: AC
  Administered 2019-03-07: 25 mg via INTRAVENOUS
  Filled 2019-03-07: qty 1

## 2019-03-07 MED ORDER — SODIUM CHLORIDE 0.9 % IV SOLN
600.0000 mg | Freq: Once | INTRAVENOUS | Status: AC
  Administered 2019-03-07: 09:00:00 600 mg via INTRAVENOUS
  Filled 2019-03-07: qty 20

## 2019-03-07 NOTE — Discharge Instructions (Signed)
Ocrelizumab injection °What is this medicine? °OCRELIZUMAB (ok re LIZ ue mab) treats multiple sclerosis. It helps to decrease the number of multiple sclerosis relapses. It is not a cure. °This medicine may be used for other purposes; ask your health care provider or pharmacist if you have questions. °COMMON BRAND NAME(S): OCREVUS °What should I tell my health care provider before I take this medicine? °They need to know if you have any of these conditions: °· cancer °· hepatitis B infection °· other infection (especially a virus infection such as chickenpox, cold sores, or herpes) °· an unusual or allergic reaction to ocrelizumab, other medicines, foods, dyes or preservatives °· pregnant or trying to get pregnant °· breast-feeding °How should I use this medicine? °This medicine is for infusion into a vein. It is given by a health care professional in a hospital or clinic setting. °Talk to your pediatrician regarding the use of this medicine in children. Special care may be needed. °Overdosage: If you think you have taken too much of this medicine contact a poison control center or emergency room at once. °NOTE: This medicine is only for you. Do not share this medicine with others. °What if I miss a dose? °Keep appointments for follow-up doses as directed. It is important not to miss your dose. Call your doctor or health care professional if you are unable to keep an appointment. °What may interact with this medicine? °· alemtuzumab °· daclizumab °· dimethyl fumarate °· fingolimod °· glatiramer °· interferon beta °· live virus vaccines °· mitoxantrone °· natalizumab °· peginterferon beta °· rituximab °· steroid medicines like prednisone or cortisone °· teriflunomide °This list may not describe all possible interactions. Give your health care provider a list of all the medicines, herbs, non-prescription drugs, or dietary supplements you use. Also tell them if you smoke, drink alcohol, or use illegal drugs. Some items  may interact with your medicine. °What should I watch for while using this medicine? °Tell your doctor or healthcare professional if your symptoms do not start to get better or if they get worse. °This medicine can cause serious allergic reactions. To reduce your risk you may need to take medicine before treatment with this medicine. Take your medicine as directed. °Women should inform their doctor if they wish to become pregnant or think they might be pregnant. There is a potential for serious side effects to an unborn child. Talk to your health care professional or pharmacist for more information. Male patients should use effective birth control methods while receiving this medicine and for 6 months after the last dose. °Call your doctor or health care professional for advice if you get a fever, chills or sore throat, or other symptoms of a cold or flu. Do not treat yourself. This drug decreases your body's ability to fight infections. Try to avoid being around people who are sick. °If you have a hepatitis B infection or a history of a hepatitis B infection, talk to your doctor. The symptoms of hepatitis B may get worse if you take this medicine. °In some patients, this medicine may cause a serious brain infection that may cause death. If you have any problems seeing, thinking, speaking, walking, or standing, tell your doctor right away. If you cannot reach your doctor, urgently seek other source of medical care. °This medicine can decrease the response to a vaccine. If you need to get vaccinated, tell your healthcare professional if you have received this medicine. Extra booster doses may be needed. Talk to your   doctor to see if a different vaccination schedule is needed. °Talk to your doctor about your risk of cancer. You may be more at risk for certain types of cancers if you take this medicine. °What side effects may I notice from receiving this medicine? °Side effects that you should report to your doctor  or health care professional as soon as possible: °· allergic reactions like skin rash, itching or hives, swelling of the face, lips, or tongue °· breathing problems °· facial flushing °· fast, irregular heartbeat °· lump or soreness in the breast °· signs and symptoms of herpes such as cold sore, shingles, or genital sores °· signs and symptoms of infection like fever or chills, cough, sore throat, pain or trouble passing urine °· signs and symptoms of low blood pressure like dizziness; feeling faint or lightheaded, falls; unusually weak or tired °· signs and symptoms of progressive multifocal leukoencephalopathy (PML) like changes in vision; clumsiness; confusion; personality changes; weakness on one side of the body °· swelling of the ankles, feet, hands °Side effects that usually do not require medical attention (report these to your doctor or health care professional if they continue or are bothersome): °· back pain °· depressed mood °· diarrhea °· pain, redness, or irritation at site where injected °This list may not describe all possible side effects. Call your doctor for medical advice about side effects. You may report side effects to FDA at 1-800-FDA-1088. °Where should I keep my medicine? °This drug is given in a hospital or clinic and will not be stored at home. °NOTE: This sheet is a summary. It may not cover all possible information. If you have questions about this medicine, talk to your doctor, pharmacist, or health care provider. °© 2020 [employer] Standard (2015-07-30 09:40:25) ° °

## 2019-03-09 LAB — COMPREHENSIVE METABOLIC PANEL
ALT: 11 IU/L (ref 0–44)
AST: 14 IU/L (ref 0–40)
Albumin/Globulin Ratio: 2 (ref 1.2–2.2)
Albumin: 4.9 g/dL (ref 3.8–4.9)
Alkaline Phosphatase: 53 IU/L (ref 39–117)
BUN/Creatinine Ratio: 11 (ref 9–20)
BUN: 13 mg/dL (ref 6–24)
Bilirubin Total: 0.8 mg/dL (ref 0.0–1.2)
CO2: 23 mmol/L (ref 20–29)
Calcium: 10.1 mg/dL (ref 8.7–10.2)
Chloride: 104 mmol/L (ref 96–106)
Creatinine, Ser: 1.21 mg/dL (ref 0.76–1.27)
GFR calc Af Amer: 77 mL/min/{1.73_m2} (ref >59)
GFR calc non Af Amer: 67 mL/min/{1.73_m2} (ref >59)
Globulin, Total: 2.4 g/dL (ref 1.5–4.5)
Glucose: 92 mg/dL (ref 65–99)
Potassium: 4.1 mmol/L (ref 3.5–5.2)
Sodium: 140 mmol/L (ref 134–144)
Total Protein: 7.3 g/dL (ref 6.0–8.5)

## 2019-03-09 LAB — CBC WITH DIFFERENTIAL/PLATELET
Basophils Absolute: 0.1 10*3/uL (ref 0.0–0.2)
Basos: 1 %
EOS (ABSOLUTE): 0.1 10*3/uL (ref 0.0–0.4)
Eos: 1 %
Hematocrit: 44.4 % (ref 37.5–51.0)
Hemoglobin: 15.5 g/dL (ref 13.0–17.7)
Immature Grans (Abs): 0 10*3/uL (ref 0.0–0.1)
Immature Granulocytes: 0 %
Lymphocytes Absolute: 2.3 10*3/uL (ref 0.7–3.1)
Lymphs: 44 %
MCH: 31.5 pg (ref 26.6–33.0)
MCHC: 34.9 g/dL (ref 31.5–35.7)
MCV: 90 fL (ref 79–97)
Monocytes Absolute: 0.4 10*3/uL (ref 0.1–0.9)
Monocytes: 8 %
Neutrophils Absolute: 2.4 10*3/uL (ref 1.4–7.0)
Neutrophils: 46 %
Platelets: 238 10*3/uL (ref 150–450)
RBC: 4.92 x10E6/uL (ref 4.14–5.80)
RDW: 12.5 % (ref 11.6–15.4)
WBC: 5.3 10*3/uL (ref 3.4–10.8)

## 2019-03-09 LAB — CD19 AND CD20, FLOW CYTOMETRY

## 2019-03-13 ENCOUNTER — Telehealth: Payer: Self-pay

## 2019-03-13 NOTE — Telephone Encounter (Signed)
-----   Message from Kathrynn Ducking, MD sent at 03/09/2019  4:56 PM EST ----- Blood work is unremarkable, CD20 cells are not identified, indicating good response to Ocrevus.  Please call the patient. ----- Message ----- From: Lavone Neri Lab Results In Sent: 03/07/2019   7:37 AM EST To: Kathrynn Ducking, MD

## 2019-03-13 NOTE — Telephone Encounter (Signed)
Attempted to reach pt. Pt was not available and did not have a vm.

## 2019-03-14 NOTE — Telephone Encounter (Signed)
I attempted to reach the pt for the second time today.  Pt was not available and did not have a working vm.  I have sent a letter to the pt asking him to call so we could review lab results.   Letter placed in out going mail at [facility]

## 2019-03-21 ENCOUNTER — Other Ambulatory Visit: Payer: Self-pay | Admitting: Neurology

## 2019-03-21 DIAGNOSIS — G35 Multiple sclerosis: Secondary | ICD-10-CM

## 2019-05-10 ENCOUNTER — Telehealth: Payer: Self-pay | Admitting: *Deleted

## 2019-05-10 NOTE — Telephone Encounter (Signed)
We received a fax from [employer] requesting [name] to sign a new Patient Consent Form.  I called him and he will come to our office tomorrow to sign the required form. [facility] indicated this form is needed so that he can continue to be enrolled in their services.

## 2019-05-11 NOTE — Telephone Encounter (Signed)
Patient came to office and signed the update Ocrevus form.  It has been faxed back to [phone] with confirmation of receipt.

## 2019-05-25 ENCOUNTER — Emergency Department (HOSPITAL_COMMUNITY)
Admission: EM | Admit: 2019-05-25 | Discharge: 2019-05-25 | Disposition: A | Discharge status: Home / Self Care | Payer: Medicaid Other | Attending: Emergency Medicine | Admitting: Emergency Medicine

## 2019-05-25 ENCOUNTER — Emergency Department (HOSPITAL_COMMUNITY): Payer: Medicaid Other

## 2019-05-25 ENCOUNTER — Other Ambulatory Visit: Payer: Self-pay

## 2019-05-25 ENCOUNTER — Encounter (HOSPITAL_COMMUNITY): Payer: Self-pay

## 2019-05-25 DIAGNOSIS — G35 Multiple sclerosis: Secondary | ICD-10-CM | POA: Insufficient documentation

## 2019-05-25 DIAGNOSIS — Y999 Unspecified external cause status: Secondary | ICD-10-CM | POA: Insufficient documentation

## 2019-05-25 DIAGNOSIS — W010XXA Fall on same level from slipping, tripping and stumbling without subsequent striking against object, initial encounter: Secondary | ICD-10-CM | POA: Diagnosis not present

## 2019-05-25 DIAGNOSIS — Y929 Unspecified place or not applicable: Secondary | ICD-10-CM | POA: Insufficient documentation

## 2019-05-25 DIAGNOSIS — S80912A Unspecified superficial injury of left knee, initial encounter: Secondary | ICD-10-CM | POA: Diagnosis present

## 2019-05-25 DIAGNOSIS — I10 Essential (primary) hypertension: Secondary | ICD-10-CM | POA: Insufficient documentation

## 2019-05-25 DIAGNOSIS — S8392XA Sprain of unspecified site of left knee, initial encounter: Secondary | ICD-10-CM | POA: Insufficient documentation

## 2019-05-25 DIAGNOSIS — Z79899 Other long term (current) drug therapy: Secondary | ICD-10-CM | POA: Insufficient documentation

## 2019-05-25 DIAGNOSIS — Y9301 Activity, walking, marching and hiking: Secondary | ICD-10-CM | POA: Diagnosis not present

## 2019-05-25 DIAGNOSIS — F17210 Nicotine dependence, cigarettes, uncomplicated: Secondary | ICD-10-CM | POA: Insufficient documentation

## 2019-05-25 MED ORDER — CYCLOBENZAPRINE HCL 10 MG PO TABS: 10.0000 mg | ORAL_TABLET | Freq: Two times a day (BID) | ORAL | 0 refills | Status: DC | PRN

## 2019-05-25 MED ORDER — IBUPROFEN 600 MG PO TABS: 600.0000 mg | ORAL_TABLET | Freq: Four times a day (QID) | ORAL | 0 refills | Status: DC | PRN

## 2019-05-25 NOTE — Discharge Instructions (Signed)
Fortunately xray of your left knee is normal, no evidence of any broken bone.  Wear knee sleeve for support.  Call and follow up with orthopedist as needed for your knee injury.

## 2019-05-25 NOTE — ED Provider Notes (Signed)
[facility]-EMERGENCY DEPT Provider Note   CSN: [id number] Arrival date & time: 05/25/19  1316     History Chief Complaint  Patient presents with  . Knee Injury    [name] is a 54 y.o. male.  The history is provided by the patient and medical records. No language interpreter was used.     54 year old male with history of MS, spastic quadriplegia paresis, abnormality of gait presenting for evaluation of left knee injury.  Patient reports he normally walks with a walker to get around however 2 days ago his leg gave out causing him to fall back on his left knee.  Since then he has been having pain to left knee.  He described pain as a sharp pain, 10 out of 10, worse when he flex or extend his knee, nonradiating.  Pain is primarily to the anterior aspect of his knee.  No complaints of ankle or hip pain.  No new numbness or any weakness.  States that his MS is currently under control.  He tries taking home medication without adequate relief.  He is now using an electric scooter to move about.  Past Medical History:  Diagnosis Date  . Abnormality of gait 11/28/2015  . BPH (benign prostatic hyperplasia)   . Depression   . Environmental and seasonal allergies   . History of hiatal hernia   . Hypertension   . Multiple sclerosis (HCC)   . 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 Displaced fracture of lateral end of right clavicle, initial encounter for closed fracture 08/30/2017  . 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 Abnormality of gait 11/28/2015  . 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 BPH (benign prostatic hyperplasia) 10/28/2015  . Fracture of fibula with tibia, right, closed 12/09/2011  . Muscle weakness (generalized) 12/09/2011  . Difficulty in walking(719.7) 12/09/2011    Past Surgical History:  Procedure  Laterality Date  . APPENDECTOMY    . CLOSED REDUCTION NASAL FRACTURE  10-08-2004   and Septoplasty  . HERNIA REPAIR     pt states has had hiatal hernia repair   . HERNIA REPAIR    . left ankle surgery     . LUMBAR DISC SURGERY  11-02-2000   L4 -- L5  . LUMBAR DISC SURGERY  2002  . ORIF ANKLE FRACTURE Right 2008  . ORIF HUMERUS FRACTURE Right 2008  . ORIF RIGHT PROXIMAL HUMERUS FX AND ORIF RIGHT ANKLE FX  01-27-2007  . RIGHT SHOULDER MANIPULATION WITH HARDWARE REMOVAL/  RIGHT TIBIA NONUNION SITE  HARDWARE REMOVAL AND EXCHANGED AND BONE GRAFT  AND ALLOGRAFT  05-10-2007  . SEPTOPLASTY  2006  . SHOULDER SURGERY Right 2009  . TRANSURETHRAL RESECTION OF PROSTATE N/A 10/28/2015   Procedure: TRANSURETHRAL RESECTION OF THE PROSTATE WITH GYRUS INSTRUMENTS;  Surgeon: Malen Gauze, MD;  Location: [facility];  Service: Urology;  Laterality: N/A;  . TRANSURETHRAL RESECTION OF PROSTATE  10/28/2015       Family History  Problem Relation Age of Onset  . Colon cancer Mother     Social History   Tobacco Use  . Smoking status: Current Every Day Smoker    Packs/day: 0.50    Years: 30.00    Pack years: 15.00    Types: Cigarettes  . Smokeless tobacco: Never Used  Substance Use Topics  . Alcohol use: Not Currently  Comment: Drinks beer occasionally  . Drug use: Not Currently    Types: Cocaine, Marijuana    Comment:  cocaine use--  per pt last cocaine Feb 2017    Home Medications Prior to Admission medications   Medication Sig Start Date End Date Taking? Authorizing Provider  acetaminophen (TYLENOL) 500 MG tablet Take 1,000 mg by mouth every 6 (six) hours as needed.    [provider]  baclofen (LIORESAL) 20 MG tablet Take 1 tablet (20 mg total) by mouth 4 (four) times daily. 03/06/19   Suzzanne Cloud, NP  FLUoxetine (PROZAC) 40 MG capsule Take 40 mg by mouth every morning.    [provider]  gabapentin (NEURONTIN) 300 MG capsule Take 1 capsule (300 mg total) by mouth 2  (two) times daily. 03/06/19   Suzzanne Cloud, NP  lisinopril-hydrochlorothiazide (PRINZIDE,ZESTORETIC) 20-12.5 MG tablet Take 1 tablet by mouth every morning.     [provider]  montelukast (SINGULAIR) 10 MG tablet Take 10 mg by mouth every morning.     [provider]  OCREVUS 300 MG/10ML injection INJECT 20 ML (600 MG) IV EVERY 6 MONTHS 02/13/19   Kathrynn Ducking, MD  tamsulosin (FLOMAX) 0.4 MG CAPS capsule Take 1 capsule (0.4 mg total) by mouth daily. Patient not taking: Reported on 03/06/2019 07/14/15   Veryl Speak, MD  traZODone (DESYREL) 150 MG tablet Take 75 mg by mouth at bedtime.     [provider]    Allergies    Morphine and related and Morphine and related  Review of Systems   Review of Systems  Constitutional: Negative for fever.  Musculoskeletal: Positive for arthralgias.  Skin: Negative for wound.  Neurological: Negative for numbness.    Physical Exam Updated Vital Signs BP 107/87 (BP Location: Right Arm)   Pulse 76   Temp (!) 97.5 F (36.4 C) (Oral)   Resp 17   SpO2 96%   Physical Exam Vitals and nursing note reviewed.  Constitutional:      General: He is not in acute distress.    Appearance: He is well-developed.  HENT:     Head: Atraumatic.  Eyes:     Conjunctiva/sclera: Conjunctivae normal.  Musculoskeletal:        General: Tenderness (Left knee: Tenderness to anterior knee at the patella as well as to the posterior knee on palpation.  Decreased knee flexion and extension secondary to pain.  No joint laxity, no obvious deformity, no crepitus.  Patella is located.) present.     Cervical back: Neck supple.     Comments: Left ankle and left hip nontender to palpation.  Skin:    Findings: No rash.  Neurological:     Mental Status: He is alert.     ED Results / Procedures / Treatments   Labs (all labs ordered are listed, but only abnormal results are displayed) Labs Reviewed - No data to  display  EKG None  Radiology DG Knee Complete 4 Views Left  Result Date: 05/25/2019 CLINICAL DATA:  Recent fall with left knee pain, initial encounter EXAM: LEFT KNEE - COMPLETE 4+ VIEW COMPARISON:  None. FINDINGS: Mild osteopenia is noted. No acute fracture or dislocation is seen. No joint effusion is noted. No soft tissue abnormality is seen. IMPRESSION: No acute abnormality noted. Electronically Signed   By: Inez Catalina M.D.   On: 05/25/2019 14:08    Procedures Procedures (including critical care time)  Medications Ordered in ED Medications - No data to display  ED Course  I have reviewed the triage vital signs and the nursing notes.  Pertinent labs & imaging results that were available during my care of the patient were reviewed by me and considered in my medical decision making (see chart for details).    MDM Rules/Calculators/A&P                      BP 107/87 (BP Location: Right Arm)   Pulse 76   Temp (!) 97.5 F (36.4 C) (Oral)   Resp 17   SpO2 96%   Final Clinical Impression(s) / ED Diagnoses Final diagnoses:  Sprain of left knee, unspecified ligament, initial encounter    Rx / DC Orders ED Discharge Orders         Ordered    ibuprofen (ADVIL) 600 MG tablet  Every 6 hours PRN     05/25/19 1422    cyclobenzaprine (FLEXERIL) 10 MG tablet  2 times daily PRN     05/25/19 1422         1:41 PM Patient with history of MS, he walks with a walker, who fell on his left knee when his legs gave out on him 2 days ago.  No significant signs of injury noted on initial exam.  X-ray of left knee ordered.  No new numbness to suggest MS flare.  2:18 PM X-ray of the left knee is unremarkable.  Will provide knee sleeves for support, orthopedic referral given as needed.  [facility] therapy discussed.   Fayrene Helper, PA-C 05/25/19 1424    Jacalyn Lefevre, MD 05/25/19 [phone]

## 2019-05-25 NOTE — ED Triage Notes (Signed)
Pt presents with c/o left knee injury that occurred 2 days ago. Pt reports that he normally uses a walker to get around. Pt reports he was standing and his legs gave out, causing him to fall back on the left knee. Pt reports pain when attempting to stand up.
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
## 2019-08-29 ENCOUNTER — Other Ambulatory Visit: Payer: Self-pay | Admitting: *Deleted

## 2019-08-29 DIAGNOSIS — R001 Bradycardia, unspecified: Secondary | ICD-10-CM

## 2019-08-30 ENCOUNTER — Telehealth: Payer: Self-pay | Admitting: *Deleted

## 2019-08-30 ENCOUNTER — Encounter: Payer: Self-pay | Admitting: *Deleted

## 2019-08-30 NOTE — Telephone Encounter (Signed)
Patient enrolled for [employer] to ship a 30 day cardiac event monitor to his home.  A letter with instructions will be mailed to patient.

## 2019-09-02 ENCOUNTER — Ambulatory Visit (INDEPENDENT_AMBULATORY_CARE_PROVIDER_SITE_OTHER): Payer: Medicaid Other

## 2019-09-02 DIAGNOSIS — R001 Bradycardia, unspecified: Secondary | ICD-10-CM

## 2019-09-04 ENCOUNTER — Other Ambulatory Visit: Payer: Self-pay | Admitting: Neurology

## 2019-09-04 ENCOUNTER — Telehealth: Payer: Self-pay | Admitting: Neurology

## 2019-09-04 ENCOUNTER — Ambulatory Visit (HOSPITAL_COMMUNITY)
Admission: RE | Admit: 2019-09-04 | Discharge: 2019-09-04 | Disposition: A | Discharge status: Home / Self Care | Payer: Medicaid Other | Source: Ambulatory Visit | Attending: Family Medicine | Admitting: Family Medicine

## 2019-09-04 DIAGNOSIS — G35 Multiple sclerosis: Secondary | ICD-10-CM

## 2019-09-04 NOTE — Telephone Encounter (Signed)
Rep with the patient care center called to inform pt has an infusion apt but they do not have any orders and would like to know if orders could be put in for pt.

## 2019-09-04 NOTE — Progress Notes (Signed)
Dr. Anne Hahn signed orders for Ocrevus infusion.

## 2019-09-04 NOTE — Telephone Encounter (Signed)
This pt has an appointemnt this am at 0800 for ocreuvs infusion need orders placed in Epic (premed if needed, ocrevus). Fax if needed to [phone].

## 2019-09-05 ENCOUNTER — Ambulatory Visit: Payer: Medicaid Other | Admitting: Neurology

## 2019-09-05 ENCOUNTER — Other Ambulatory Visit: Payer: Self-pay

## 2019-09-05 ENCOUNTER — Encounter: Payer: Self-pay | Admitting: Neurology

## 2019-09-05 VITALS — BP 117/78 | HR 62 | Temp 98.0°F | Ht 73.0 in | Wt 185.0 lb

## 2019-09-05 DIAGNOSIS — R269 Unspecified abnormalities of gait and mobility: Secondary | ICD-10-CM

## 2019-09-05 DIAGNOSIS — G35 Multiple sclerosis: Secondary | ICD-10-CM

## 2019-09-05 MED ORDER — GABAPENTIN 300 MG PO CAPS: 300.0000 mg | ORAL_CAPSULE | Freq: Two times a day (BID) | ORAL | 6 refills | Status: DC
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
## 2019-09-05 NOTE — Progress Notes (Signed)
I have read the note, and I agree with the clinical assessment and plan.  Mate Alegria K Budd Fre[place]   

## 2019-09-05 NOTE — Patient Instructions (Signed)
Please call to get your Ocrevus rescheduled  Continue baclofen, gabapentin Check blood work today Call for any problems See you back in 6 months

## 2019-09-06 ENCOUNTER — Telehealth: Payer: Self-pay | Admitting: *Deleted

## 2019-09-06 ENCOUNTER — Telehealth: Payer: Self-pay

## 2019-09-06 LAB — COMPREHENSIVE METABOLIC PANEL
ALT: 10 IU/L (ref 0–44)
AST: 15 IU/L (ref 0–40)
Albumin/Globulin Ratio: 1.8 (ref 1.2–2.2)
Albumin: 4.8 g/dL (ref 3.8–4.9)
Alkaline Phosphatase: 62 IU/L (ref 39–117)
BUN/Creatinine Ratio: 8 — ABNORMAL LOW (ref 9–20)
BUN: 10 mg/dL (ref 6–24)
Bilirubin Total: 0.7 mg/dL (ref 0.0–1.2)
CO2: 23 mmol/L (ref 20–29)
Calcium: 10.4 mg/dL — ABNORMAL HIGH (ref 8.7–10.2)
Chloride: 105 mmol/L (ref 96–106)
Creatinine, Ser: 1.27 mg/dL (ref 0.76–1.27)
GFR calc Af Amer: 73 mL/min/{1.73_m2} (ref >59)
GFR calc non Af Amer: 63 mL/min/{1.73_m2} (ref >59)
Globulin, Total: 2.7 g/dL (ref 1.5–4.5)
Glucose: 86 mg/dL (ref 65–99)
Potassium: 4.6 mmol/L (ref 3.5–5.2)
Sodium: 142 mmol/L (ref 134–144)
Total Protein: 7.5 g/dL (ref 6.0–8.5)

## 2019-09-06 LAB — CBC WITH DIFFERENTIAL/PLATELET
Basophils Absolute: 0.1 10*3/uL (ref 0.0–0.2)
Basos: 2 %
EOS (ABSOLUTE): 0 10*3/uL (ref 0.0–0.4)
Eos: 1 %
Hematocrit: 43.8 % (ref 37.5–51.0)
Hemoglobin: 14.8 g/dL (ref 13.0–17.7)
Immature Grans (Abs): 0 10*3/uL (ref 0.0–0.1)
Immature Granulocytes: 0 %
Lymphocytes Absolute: 2.2 10*3/uL (ref 0.7–3.1)
Lymphs: 44 %
MCH: 31.4 pg (ref 26.6–33.0)
MCHC: 33.8 g/dL (ref 31.5–35.7)
MCV: 93 fL (ref 79–97)
Monocytes Absolute: 0.4 10*3/uL (ref 0.1–0.9)
Monocytes: 8 %
Neutrophils Absolute: 2.2 10*3/uL (ref 1.4–7.0)
Neutrophils: 45 %
Platelets: 223 10*3/uL (ref 150–450)
RBC: 4.72 x10E6/uL (ref 4.14–5.80)
RDW: 12.7 % (ref 11.6–15.4)
WBC: 5 10*3/uL (ref 3.4–10.8)

## 2019-09-06 LAB — IGG, IGA, IGM
IgA/Immunoglobulin A, Serum: 198 mg/dL (ref 90–386)
IgG (Immunoglobin G), Serum: 1222 mg/dL (ref 603–1613)
IgM (Immunoglobulin M), Srm: 55 mg/dL (ref 20–172)

## 2019-09-06 NOTE — Telephone Encounter (Signed)
I called pt and relayed that his lab results looked good LMVM.  No change in ocrevus dosing.  He is to call back if questions.

## 2019-09-06 NOTE — Telephone Encounter (Addendum)
PA done on [place] [facility] OCrevus was denied.

## 2019-09-06 NOTE — Telephone Encounter (Signed)
-----   Message from Glean Salvo, NP sent at 09/06/2019 10:06 AM EDT ----- Labs looks good. No change in dosing of Ocrevus.

## 2019-09-06 NOTE — Telephone Encounter (Signed)
-----   Message from Sarah J Slack, NP sent at 09/06/2019 10:06 AM EDT ----- Labs looks good. No change in dosing of Ocrevus.  

## 2019-09-06 NOTE — Telephone Encounter (Signed)
I called pt and relayed that his lab results looked good.  No change in ocrevus dosing.  He is to call back if questions.

## 2019-09-12 NOTE — Telephone Encounter (Signed)
I called [place] [facility] that medication was denied. They stated it was denied because pt has to try and failed the preferred MS drugs. I stated pt has contraindication toavonex, betaseron, copaxone, dalfampridine, gilenya, rebif, Tecfidera. I stated pt was diagnosis in 2018 has progressive MS and needed a stronger treatment plan. The rep submitted the PA again. PA number is [id number].

## 2019-09-12 NOTE — Telephone Encounter (Signed)
Revised. 

## 2019-09-13 NOTE — Telephone Encounter (Signed)
I called [place] [facility] and ocrevus was approve from 09/12/2019 to 09/06/2020. PA number is [id number].

## 2019-09-15 MED FILL — OCREVUS 300 MG/10ML SOLN: 300 | 34 days supply | Qty: 20 | Fill #1

## 2019-09-19 ENCOUNTER — Ambulatory Visit (HOSPITAL_COMMUNITY)
Admission: RE | Admit: 2019-09-19 | Discharge: 2019-09-19 | Disposition: A | Discharge status: Home / Self Care | Payer: Medicaid Other | Source: Ambulatory Visit | Attending: Family Medicine | Admitting: Family Medicine

## 2019-09-19 ENCOUNTER — Other Ambulatory Visit: Payer: Self-pay

## 2019-09-19 DIAGNOSIS — G35 Multiple sclerosis: Secondary | ICD-10-CM | POA: Insufficient documentation

## 2019-09-19 MED ORDER — SODIUM CHLORIDE 0.9 % IV SOLN
600.0000 mg | INTRAVENOUS | Status: DC
  Administered 2019-09-19: 600 mg via INTRAVENOUS
  Filled 2019-09-19: qty 20

## 2019-09-19 MED ORDER — ACETAMINOPHEN 325 MG PO TABS
650.0000 mg | ORAL_TABLET | ORAL | Status: DC
  Administered 2019-09-19: 650 mg via ORAL
  Filled 2019-09-19: qty 2

## 2019-09-19 MED ORDER — METHYLPREDNISOLONE SODIUM SUCC 125 MG IJ SOLR
100.0000 mg | INTRAMUSCULAR | Status: DC
  Administered 2019-09-19: 100 mg via INTRAVENOUS
  Filled 2019-09-19: qty 2

## 2019-09-19 MED ORDER — SODIUM CHLORIDE 0.9 % IV SOLN
INTRAVENOUS | Status: DC | PRN
  Administered 2019-09-19: 250 mL via INTRAVENOUS

## 2019-09-19 MED ORDER — DIPHENHYDRAMINE HCL 50 MG/ML IJ SOLN
25.0000 mg | INTRAMUSCULAR | Status: DC
  Administered 2019-09-19: 25 mg via INTRAVENOUS
  Filled 2019-09-19: qty 1

## 2019-09-19 NOTE — Discharge Instructions (Signed)
Ocrelizumab injection °What is this medicine? °OCRELIZUMAB (ok re LIZ ue mab) treats multiple sclerosis. It helps to decrease the number of multiple sclerosis relapses. It is not a cure. °This medicine may be used for other purposes; ask your health care provider or pharmacist if you have questions. °COMMON BRAND NAME(S): OCREVUS °What should I tell my health care provider before I take this medicine? °They need to know if you have any of these conditions: °· cancer °· hepatitis B infection °· other infection (especially a virus infection such as chickenpox, cold sores, or herpes) °· an unusual or allergic reaction to ocrelizumab, other medicines, foods, dyes or preservatives °· pregnant or trying to get pregnant °· breast-feeding °How should I use this medicine? °This medicine is for infusion into a vein. It is given by a health care professional in a hospital or clinic setting. °A special MedGuide will be given to you before each treatment. Be sure to read this information carefully each time. °Talk to your pediatrician regarding the use of this medicine in children. Special care may be needed. °Overdosage: If you think you have taken too much of this medicine contact a poison control center or emergency room at once. °NOTE: This medicine is only for you. Do not share this medicine with others. °What if I miss a dose? °Keep appointments for follow-up doses as directed. It is important not to miss your dose. Call your doctor or health care professional if you are unable to keep an appointment. °What may interact with this medicine? °· alemtuzumab °· daclizumab °· dimethyl fumarate °· fingolimod °· glatiramer °· interferon beta °· live virus vaccines °· mitoxantrone °· natalizumab °· peginterferon beta °· rituximab °· steroid medicines like prednisone or cortisone °· teriflunomide °This list may not describe all possible interactions. Give your health care provider a list of all the medicines, herbs,  non-prescription drugs, or dietary supplements you use. Also tell them if you smoke, drink alcohol, or use illegal drugs. Some items may interact with your medicine. °What should I watch for while using this medicine? °Tell your doctor or healthcare professional if your symptoms do not start to get better or if they get worse. °This medicine can cause serious allergic reactions. To reduce your risk you may need to take medicine before treatment with this medicine. Take your medicine as directed. °Women should inform their doctor if they wish to become pregnant or think they might be pregnant. There is a potential for serious side effects to an unborn child. Talk to your health care professional or pharmacist for more information. Male patients should use effective birth control methods while receiving this medicine and for 6 months after the last dose. °Call your doctor or health care professional for advice if you get a fever, chills or sore throat, or other symptoms of a cold or flu. Do not treat yourself. This drug decreases your body's ability to fight infections. Try to avoid being around people who are sick. °If you have a hepatitis B infection or a history of a hepatitis B infection, talk to your doctor. The symptoms of hepatitis B may get worse if you take this medicine. °In some patients, this medicine may cause a serious brain infection that may cause death. If you have any problems seeing, thinking, speaking, walking, or standing, tell your doctor right away. If you cannot reach your doctor, urgently seek other source of medical care. °This medicine can decrease the response to a vaccine. If you need to get   vaccinated, tell your healthcare professional if you have received this medicine. Extra booster doses may be needed. Talk to your doctor to see if a different vaccination schedule is needed. °Talk to your doctor about your risk of cancer. You may be more at risk for certain types of cancers if you  take this medicine. °What side effects may I notice from receiving this medicine? °Side effects that you should report to your doctor or health care professional as soon as possible: °· allergic reactions like skin rash, itching or hives, swelling of the face, lips, or tongue °· breathing problems °· facial flushing °· fast, irregular heartbeat °· lump or soreness in the breast °· signs and symptoms of herpes such as cold sore, shingles, or genital sores °· signs and symptoms of infection like fever or chills, cough, sore throat, pain or trouble passing urine °· signs and symptoms of low blood pressure like dizziness; feeling faint or lightheaded, falls; unusually weak or tired °· signs and symptoms of progressive multifocal leukoencephalopathy (PML) like changes in vision; clumsiness; confusion; personality changes; weakness on one side of the body °· swelling of the ankles, feet, hands °Side effects that usually do not require medical attention (report these to your doctor or health care professional if they continue or are bothersome): °· back pain °· depressed mood °· diarrhea °· pain, redness, or irritation at site where injected °This list may not describe all possible side effects. Call your doctor for medical advice about side effects. You may report side effects to FDA at 1-800-FDA-1088. °Where should I keep my medicine? °This drug is given in a hospital or clinic and will not be stored at home. °NOTE: This sheet is a summary. It may not cover all possible information. If you have questions about this medicine, talk to your doctor, pharmacist, or health care provider. °© 2020 [employer]vier/Gold Standard (2018-04-18 07:41:53) ° °

## 2019-09-19 NOTE — Progress Notes (Signed)
PATIENT CARE CENTER NOTE  Diagnosis: Multiple sclerosis (HCC) (G35)    Provider: Stephanie Acre, MD   Procedure: Ocrevus 600 mg infusion   Note: Patient received Ocrevus infusion via PIV. Pre-medications given per order. Infusion titrated per protocol. No adverse reaction noted. Vital signs stable. Patient declined the 1 hour observation post-infusion. Discharge instructions given. Patient alert, oriented and transferred in motorized chair at discharge.

## 2019-10-23 ENCOUNTER — Ambulatory Visit: Payer: Medicaid Other | Attending: Neurology

## 2019-10-23 ENCOUNTER — Other Ambulatory Visit: Payer: Self-pay

## 2019-10-23 DIAGNOSIS — M6281 Muscle weakness (generalized): Secondary | ICD-10-CM | POA: Diagnosis not present

## 2019-10-23 DIAGNOSIS — M21372 Foot drop, left foot: Secondary | ICD-10-CM

## 2019-10-23 DIAGNOSIS — M21371 Foot drop, right foot: Secondary | ICD-10-CM | POA: Diagnosis present

## 2019-10-23 DIAGNOSIS — R2689 Other abnormalities of gait and mobility: Secondary | ICD-10-CM | POA: Diagnosis present

## 2019-10-23 DIAGNOSIS — Z9181 History of falling: Secondary | ICD-10-CM | POA: Diagnosis present
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
## 2019-11-01 ENCOUNTER — Other Ambulatory Visit: Payer: Self-pay

## 2019-11-01 ENCOUNTER — Ambulatory Visit: Payer: Medicaid Other | Attending: Neurology

## 2019-11-01 DIAGNOSIS — M21371 Foot drop, right foot: Secondary | ICD-10-CM | POA: Insufficient documentation

## 2019-11-01 DIAGNOSIS — M21372 Foot drop, left foot: Secondary | ICD-10-CM | POA: Insufficient documentation

## 2019-11-01 DIAGNOSIS — M6281 Muscle weakness (generalized): Secondary | ICD-10-CM | POA: Diagnosis not present

## 2019-11-01 DIAGNOSIS — Z9181 History of falling: Secondary | ICD-10-CM | POA: Diagnosis present

## 2019-11-01 DIAGNOSIS — R2689 Other abnormalities of gait and mobility: Secondary | ICD-10-CM | POA: Insufficient documentation

## 2019-11-01 NOTE — Therapy (Signed)
[facility] [address] [place], [place], [address] Phone: [phone]   Fax:  [phone]  Physical Therapy Treatment  Patient Details  Name: [name] MRN: [id number] Date of Birth: [birth year] Referring Provider (PT): Margie Ege, NP.   Encounter Date: 11/01/2019   PT End of Session - 11/01/19 1457    Visit Number 2    Number of Visits 4    Date for PT Re-Evaluation 11/20/19   for initial 3 visits per medicaid   Authorization Type Medicaid    Authorization Time Period 3 Visits from 11/01/19 - 11/21/19    Authorization - Visit Number 1    Authorization - Number of Visits 3    PT Start Time 1402    PT Stop Time 1446    PT Time Calculation (min) 44 min    Equipment Utilized During Treatment Gait belt    Activity Tolerance Patient limited by fatigue    Behavior During Therapy WFL for tasks assessed/performed           Past Medical History:  Diagnosis Date  . Abnormality of gait 11/28/2015  . BPH (benign prostatic hyperplasia)   . Depression   . Environmental and seasonal allergies   . History of hiatal hernia   . Hypertension   . Multiple sclerosis (HCC)   . 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 APPENDECTOMY    . CLOSED REDUCTION NASAL FRACTURE  10-08-2004   and Septoplasty  . HERNIA REPAIR     pt states has had hiatal hernia repair   . HERNIA REPAIR    . left ankle surgery     . LUMBAR DISC SURGERY  11-02-2000   L4 -- L5  . LUMBAR DISC SURGERY  2002  . ORIF ANKLE FRACTURE Right 2008  . ORIF HUMERUS FRACTURE Right 2008  . ORIF RIGHT PROXIMAL HUMERUS FX AND ORIF RIGHT ANKLE FX  01-27-2007  . RIGHT SHOULDER MANIPULATION WITH HARDWARE REMOVAL/  RIGHT TIBIA NONUNION SITE  HARDWARE REMOVAL AND EXCHANGED AND BONE GRAFT  AND ALLOGRAFT  05-10-2007  . SEPTOPLASTY  2006  . SHOULDER SURGERY  Right 2009  . TRANSURETHRAL RESECTION OF PROSTATE N/A 10/28/2015   Procedure: TRANSURETHRAL RESECTION OF THE PROSTATE WITH GYRUS INSTRUMENTS;  Surgeon: Malen Gauze, MD;  Location: [facility];  Service: Urology;  Laterality: N/A;  . TRANSURETHRAL RESECTION OF PROSTATE  10/28/2015    There were no vitals filed for this visit.   Subjective Assessment - 11/01/19 1407    Subjective Patient reports he has been trying to walk some at home with focused on improved distances with the rollator. No pain and no falls to report.    Pertinent History depression, HTN, MS with spastic quadriparesis; back surgery,     Limitations Walking;Standing;House hold activities    Patient Stated Goals wants to walk more    Currently in Pain? No/denies                             [facility] Adult PT Treatment/Exercise - 11/01/19 0001      Transfers   Transfers Sit to Stand;Stand to Sit    Sit to Stand 5: Supervision;With armrests;From chair/3-in-1;4: Min guard    Sit to Stand Details Verbal cues for technique;Verbal cues for safe use of DME/AE    Stand to Sit 5:  Supervision    Comments all sit <> stands completed with RW placed in front for safety. PT providing verbal cues for improved forward lean, and hand placement for improved technique. completed x 5 reps.       Ambulation/Gait   Ambulation/Gait Yes    Ambulation/Gait Assistance 4: Min guard    Ambulation/Gait Assistance Details completed gait training initally with rollator x 100 due to patient using this device at home. Patient able to ambulate with rollator, but demo safety concerns due to difficulty with keeping AD and brake management. Followed with gait training x 100 ft with rolling walker. Pt demo improved safety and safety when ambulating with RW, due to support provided by RW, patient also demo improved toe clearance. PT highly encouraging patient to ambulate with RW at home.     Ambulation Distance (Feet) 100 Feet   x 2   Assistive  device Rolling walker   Rollator   Gait Pattern Step-through pattern;Decreased step length - right;Decreased stance time - left;Decreased dorsiflexion - right;Decreased dorsiflexion - left;Left steppage;Right foot flat;Left foot flat;Right genu recurvatum;Left genu recurvatum;Narrow base of support    Ambulation Surface Level;Indoor      Self-Care   Self-Care Other Self-Care Comments    Other Self-Care Comments  PT providing education on importance of using RW in the home vs. rollator for improved safety. PT educating on improved stability and gait pattern noted with RW. Especially since the patient's rollator brakes are not working at this time.      Exercises   Exercises Other Exercises    Other Exercises  Initiated HEP with focus on BLE strengthening: including supine marching 1 x 10 reps alternating, and supine bridges 1 x 10 reps. Educated on proper completion and provided handout  to complete at home.                   PT Education - 11/01/19 1442    Education Details Educated on Initial HEP; Ambulating with RW vs. rollator in home for improved safety    Person(s) Educated Patient    Methods Explanation;Demonstration;Handout    Comprehension Verbalized understanding;Returned demonstration            PT Short Term Goals - 10/23/19 1737      PT SHORT TERM GOAL #1   Title Patient will be independent with initial HEP (TARGET: After initial 3 Visits)    Time 3    Period Weeks    Status New      PT SHORT TERM GOAL #2   Title Patient will demonstrate ability to ambulate 50 ft, Mod I with rollator to demonstrate improved household mobility    Time 3    Period Weeks    Status New      PT SHORT TERM GOAL #3   Title Patient will improve 5x sit <> stand w/ UE to </= 30 secs to demo improved functional mobility and BLE strength    Baseline 39.53 secs    Time 3    Period Weeks    Status New             PT Long Term Goals - 10/23/19 1740      PT LONG TERM GOAL #1    Title Patient will be independent in final HEP for BLE strengthening/stretching (TARGET: 12/18/2019)    Time 8    Period Weeks    Status New      PT LONG TERM GOAL #2   Title Patient will  demo ability to ambulate >/= 100 ft on level indoor surfaces, Mod I with rollator for improved household mobility    Time 8    Period Weeks    Status New      PT LONG TERM GOAL #3   Title Patient will improve gait speed to >/= 1.0 ft/sec to demo reduced risk for falls and improved mobility    Time 8    Period Weeks    Status New      PT LONG TERM GOAL #4   Title Patient will report understanding of fall prevention strategies and safe use of rollator to reduce risk for falls    Time 8    Period Weeks    Status New      PT LONG TERM GOAL #5   Title Patient will improve 5x sit <> stand to </= 25 secs to demo improved functional strength of BLE's    Time 8    Period Weeks    Status New                 Plan - 11/01/19 1504    Clinical Impression Statement Today's session included gait training with rollator vs. RW. Patient able to demonstrate improved safety and gait pattern with RW at this time, and PT encouraging patient to utilize RW at home for improved safety. Rest of session focused on initiating HEP to allow for further strengthening activties outside of therapy sessions. PT educating on proper completion and provided handout. Patient will continue to benefit from skilled PT services to progress toward all goals, and maximize functional mobility.    Personal Factors and Comorbidities Comorbidity 2;Time since onset of injury/illness/exacerbation;Other   progress nature of MS, social situation   Comorbidities HTN, Depression, MS with spastic quadparesis, back surgery    Examination-Activity Limitations Transfers;Stand;Stairs    Examination-Participation Restrictions Cleaning;Community Activity    Stability/Clinical Decision Making Evolving/Moderate complexity    Rehab Potential Good     PT Frequency 1x / week    PT Duration 3 weeks   followed by 1x/week for 5 weeks   PT Treatment/Interventions ADLs/Self Care Home Management;DME Instruction;Gait training;Stair training;Functional mobility training;Therapeutic activities;Therapeutic exercise;Balance training;Orthotic Fit/Training;Patient/family education;Neuromuscular re-education;Wheelchair mobility training;Manual techniques;Energy conservation;Passive range of motion;Electrical Stimulation    PT Next Visit Plan How was HEP? Any news on power chair? BLE strengthening exercises    Consulted and Agree with Plan of Care Patient           Patient will benefit from skilled therapeutic intervention in order to improve the following deficits and impairments:  Abnormal gait, Decreased activity tolerance, Decreased balance, Decreased endurance, Decreased coordination, Decreased knowledge of use of DME, Decreased mobility, Decreased range of motion, Difficulty walking, Decreased strength, Increased muscle spasms, Increased fascial restricitons, Impaired sensation, Impaired tone, Postural dysfunction, Improper body mechanics, Pain  Visit Diagnosis: Muscle weakness (generalized)  Other abnormalities of gait and mobility  History of falling     Problem List Patient Active Problem List   Diagnosis Date Noted  . Displaced fracture of lateral end of right clavicle, initial encounter for closed fracture 08/30/2017  . 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 Abnormality of gait 11/28/2015  . 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 BPH (benign prostatic hyperplasia) 10/28/2015  . Fracture of fibula with tibia, right, closed 12/09/2011  . Muscle weakness (generalized) 12/09/2011  . Difficulty in walking(719.7) 12/09/2011    Tempie Donning, PT, DPT 11/01/2019, 3:08 PM  [facility]  [address] [place], [place], [address] Phone: [phone]   Fax:   [phone]  Name: ARICK MARENO MRN: [id number] Date of Birth: [birth year]

## 2019-11-01 NOTE — Patient Instructions (Signed)
Access Code: [email or web address] URL: [email or web address] Date: 11/01/2019 Prepared by: Jethro Bastos  Exercises Sit to Stand with Armchair - 2 x daily - 7 x weekly - 2 sets - 5 reps Supine March - 1 x daily - 7 x weekly - 2 sets - 10 reps Supine Bridge - 1 x daily - 7 x weekly - 2 sets - 10 reps

## 2019-11-10 ENCOUNTER — Ambulatory Visit: Payer: Medicaid Other | Admitting: Physical Therapy

## 2019-11-10 ENCOUNTER — Encounter: Payer: Self-pay | Admitting: Physical Therapy

## 2019-11-10 ENCOUNTER — Other Ambulatory Visit: Payer: Self-pay

## 2019-11-10 DIAGNOSIS — Z9181 History of falling: Secondary | ICD-10-CM

## 2019-11-10 DIAGNOSIS — M6281 Muscle weakness (generalized): Secondary | ICD-10-CM | POA: Diagnosis not present

## 2019-11-10 DIAGNOSIS — M21371 Foot drop, right foot: Secondary | ICD-10-CM

## 2019-11-10 DIAGNOSIS — M21372 Foot drop, left foot: Secondary | ICD-10-CM

## 2019-11-10 DIAGNOSIS — R2689 Other abnormalities of gait and mobility: Secondary | ICD-10-CM

## 2019-11-10 NOTE — Therapy (Signed)
[facility] [address] [place], [place], [address] Phone: [phone]   Fax:  [phone]  Physical Therapy Treatment  Patient Details  Name: [name] MRN: [id number] Date of Birth: Aug 26, 1965 Referring Provider (PT): Margie Ege, NP.   Encounter Date: 11/10/2019   PT End of Session - 11/10/19 1332    Visit Number 3    Number of Visits 4    Date for PT Re-Evaluation 11/20/19   for initial 3 visits per medicaid   Authorization Type Medicaid    Authorization Time Period 3 Visits from 11/01/19 - 11/21/19    Authorization - Visit Number 2    Authorization - Number of Visits 3    PT Start Time 1330   pt late due to transportation   PT Stop Time 1400    PT Time Calculation (min) 30 min    Equipment Utilized During Treatment Gait belt    Activity Tolerance Patient limited by fatigue;Patient tolerated treatment well    Behavior During Therapy WFL for tasks assessed/performed           Past Medical History:  Diagnosis Date  . Abnormality of gait 11/28/2015  . BPH (benign prostatic hyperplasia)   . Depression   . Environmental and seasonal allergies   . History of hiatal hernia   . Hypertension   . Multiple sclerosis (HCC)   . 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 APPENDECTOMY    . CLOSED REDUCTION NASAL FRACTURE  10-08-2004   and Septoplasty  . HERNIA REPAIR     pt states has had hiatal hernia repair   . HERNIA REPAIR    . left ankle surgery     . LUMBAR DISC SURGERY  11-02-2000   L4 -- L5  . LUMBAR DISC SURGERY  2002  . ORIF ANKLE FRACTURE Right 2008  . ORIF HUMERUS FRACTURE Right 2008  . ORIF RIGHT PROXIMAL HUMERUS FX AND ORIF RIGHT ANKLE FX  01-27-2007  . RIGHT SHOULDER MANIPULATION WITH HARDWARE REMOVAL/  RIGHT TIBIA NONUNION SITE  HARDWARE REMOVAL AND EXCHANGED AND BONE GRAFT  AND  ALLOGRAFT  05-10-2007  . SEPTOPLASTY  2006  . SHOULDER SURGERY Right 2009  . TRANSURETHRAL RESECTION OF PROSTATE N/A 10/28/2015   Procedure: TRANSURETHRAL RESECTION OF THE PROSTATE WITH GYRUS INSTRUMENTS;  Surgeon: Malen Gauze, MD;  Location: [facility];  Service: Urology;  Laterality: N/A;  . TRANSURETHRAL RESECTION OF PROSTATE  10/28/2015    There were no vitals filed for this visit.   Subjective Assessment - 11/10/19 1332    Subjective No new complaints. Denies any pain or falls. HEP is going well "when I'm not too tired to do them".    Pertinent History depression, HTN, MS with spastic quadriparesis; back surgery,     Limitations Walking;Standing;House hold activities    Patient Stated Goals wants to walk more    Currently in Pain? No/denies                 [facility] Adult PT Treatment/Exercise - 11/10/19 1334      Transfers   Transfers Sit to Stand;Stand to [employer] Transfers    Stand Pivot Transfers 4: Min guard    Stand Pivot Transfer Details (indicate cue type and reason) min guard assist for safety from power chair to/from mat table.       Exercises   Exercises Other  Exercises    Other Exercises  bridge with 5 sec hold for 10 reps; green band resisted hip fall outs for 10 reps on each side; seated at edge of mat: red band resisted hamstring curls, long arc quads, and marching for 10 reps each. cues on ex form/technique and hold times.                PT Short Term Goals - 10/23/19 1737      PT SHORT TERM GOAL #1   Title Patient will be independent with initial HEP (TARGET: After initial 3 Visits)    Time 3    Period Weeks    Status New      PT SHORT TERM GOAL #2   Title Patient will demonstrate ability to ambulate 50 ft, Mod I with rollator to demonstrate improved household mobility    Time 3    Period Weeks    Status New      PT SHORT TERM GOAL #3   Title Patient will improve 5x sit <> stand w/ UE to </= 30 secs to demo improved functional  mobility and BLE strength    Baseline 39.53 secs    Time 3    Period Weeks    Status New             PT Long Term Goals - 10/23/19 1740      PT LONG TERM GOAL #1   Title Patient will be independent in final HEP for BLE strengthening/stretching (TARGET: 12/18/2019)    Time 8    Period Weeks    Status New      PT LONG TERM GOAL #2   Title Patient will demo ability to ambulate >/= 100 ft on level indoor surfaces, Mod I with rollator for improved household mobility    Time 8    Period Weeks    Status New      PT LONG TERM GOAL #3   Title Patient will improve gait speed to >/= 1.0 ft/sec to demo reduced risk for falls and improved mobility    Time 8    Period Weeks    Status New      PT LONG TERM GOAL #4   Title Patient will report understanding of fall prevention strategies and safe use of rollator to reduce risk for falls    Time 8    Period Weeks    Status New      PT LONG TERM GOAL #5   Title Patient will improve 5x sit <> stand to </= 25 secs to demo improved functional strength of BLE's    Time 8    Period Weeks    Status New                 Plan - 11/10/19 1333    Clinical Impression Statement Today's skilled session focused on LE strengthening with no issues other fatigue reported. Rest breaks taken as needed. The pt is progressing toward goals and should benefit from continued PT to progress toward unmet goals.    Personal Factors and Comorbidities Comorbidity 2;Time since onset of injury/illness/exacerbation;Other   progress nature of MS, social situation   Comorbidities HTN, Depression, MS with spastic quadparesis, back surgery    Examination-Activity Limitations Transfers;Stand;Stairs    Examination-Participation Restrictions Cleaning;Community Activity    Stability/Clinical Decision Making Evolving/Moderate complexity    Rehab Potential Good    PT Frequency 1x / week    PT Duration 3 weeks   followed  by 1x/week for 5 weeks   PT  Treatment/Interventions ADLs/Self Care Home Management;DME Instruction;Gait training;Stair training;Functional mobility training;Therapeutic activities;Therapeutic exercise;Balance training;Orthotic Fit/Training;Patient/family education;Neuromuscular re-education;Wheelchair mobility training;Manual techniques;Energy conservation;Passive range of motion;Electrical Stimulation    PT Next Visit Plan BLE strengthening exercises, gait safety with RW/rollator    Consulted and Agree with Plan of Care Patient           Patient will benefit from skilled therapeutic intervention in order to improve the following deficits and impairments:  Abnormal gait, Decreased activity tolerance, Decreased balance, Decreased endurance, Decreased coordination, Decreased knowledge of use of DME, Decreased mobility, Decreased range of motion, Difficulty walking, Decreased strength, Increased muscle spasms, Increased fascial restricitons, Impaired sensation, Impaired tone, Postural dysfunction, Improper body mechanics, Pain  Visit Diagnosis: Muscle weakness (generalized)  Other abnormalities of gait and mobility  History of falling  Foot drop, left  Foot drop, right     Problem List Patient Active Problem List   Diagnosis Date Noted  . Displaced fracture of lateral end of right clavicle, initial encounter for closed fracture 08/30/2017  . 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 Abnormality of gait 11/28/2015  . 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 BPH (benign prostatic hyperplasia) 10/28/2015  . Fracture of fibula with tibia, right, closed 12/09/2011  . Muscle weakness (generalized) 12/09/2011  . Difficulty in walking(719.7) 12/09/2011    Sallyanne Kuster, PTA, [facility] [address], [address] [place], [place] [address] [phone] 11/10/19, 4:35 PM   Name: [name] MRN: [id number] Date of Birth: [birth year]

## 2019-11-17 ENCOUNTER — Other Ambulatory Visit: Payer: Self-pay

## 2019-11-17 ENCOUNTER — Ambulatory Visit: Payer: Medicaid Other

## 2019-11-17 DIAGNOSIS — M6281 Muscle weakness (generalized): Secondary | ICD-10-CM

## 2019-11-17 DIAGNOSIS — Z9181 History of falling: Secondary | ICD-10-CM

## 2019-11-17 DIAGNOSIS — M21372 Foot drop, left foot: Secondary | ICD-10-CM

## 2019-11-17 DIAGNOSIS — R2689 Other abnormalities of gait and mobility: Secondary | ICD-10-CM

## 2019-11-17 DIAGNOSIS — M21371 Foot drop, right foot: Secondary | ICD-10-CM
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
## 2019-12-01 ENCOUNTER — Ambulatory Visit: Payer: Medicaid Other | Attending: Neurology

## 2019-12-01 ENCOUNTER — Other Ambulatory Visit: Payer: Self-pay

## 2019-12-01 DIAGNOSIS — Z9181 History of falling: Secondary | ICD-10-CM | POA: Diagnosis present

## 2019-12-01 DIAGNOSIS — R2689 Other abnormalities of gait and mobility: Secondary | ICD-10-CM | POA: Insufficient documentation

## 2019-12-01 DIAGNOSIS — M21372 Foot drop, left foot: Secondary | ICD-10-CM | POA: Diagnosis present

## 2019-12-01 DIAGNOSIS — M21371 Foot drop, right foot: Secondary | ICD-10-CM

## 2019-12-01 DIAGNOSIS — M6281 Muscle weakness (generalized): Secondary | ICD-10-CM | POA: Diagnosis present
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
## 2019-12-15 ENCOUNTER — Telehealth: Payer: Self-pay

## 2019-12-15 ENCOUNTER — Other Ambulatory Visit: Payer: Self-pay

## 2019-12-15 ENCOUNTER — Ambulatory Visit: Payer: Medicaid Other

## 2019-12-15 DIAGNOSIS — M6281 Muscle weakness (generalized): Secondary | ICD-10-CM | POA: Diagnosis not present

## 2019-12-15 DIAGNOSIS — Z9181 History of falling: Secondary | ICD-10-CM

## 2019-12-15 DIAGNOSIS — R2689 Other abnormalities of gait and mobility: Secondary | ICD-10-CM

## 2019-12-15 DIAGNOSIS — G35 Multiple sclerosis: Secondary | ICD-10-CM

## 2019-12-15 DIAGNOSIS — M21372 Foot drop, left foot: Secondary | ICD-10-CM

## 2019-12-15 DIAGNOSIS — M21371 Foot drop, right foot: Secondary | ICD-10-CM
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
## 2019-12-21 NOTE — Telephone Encounter (Signed)
Thank you :)

## 2019-12-22 ENCOUNTER — Ambulatory Visit: Payer: Medicaid Other

## 2019-12-29 ENCOUNTER — Other Ambulatory Visit: Payer: Self-pay

## 2019-12-29 ENCOUNTER — Encounter: Payer: Self-pay | Admitting: Physical Therapy

## 2019-12-29 ENCOUNTER — Ambulatory Visit: Payer: Medicaid Other | Attending: Neurology | Admitting: Physical Therapy

## 2019-12-29 DIAGNOSIS — M21372 Foot drop, left foot: Secondary | ICD-10-CM | POA: Diagnosis present

## 2019-12-29 DIAGNOSIS — R2689 Other abnormalities of gait and mobility: Secondary | ICD-10-CM | POA: Insufficient documentation

## 2019-12-29 DIAGNOSIS — M6281 Muscle weakness (generalized): Secondary | ICD-10-CM | POA: Insufficient documentation

## 2019-12-29 NOTE — Therapy (Addendum)
[facility] [address] [place], [place], [address] Phone: [phone]   Fax:  [phone]  Physical Therapy Treatment  Patient Details  Name: [name] MRN: [id number] Date of Birth: 1965/09/05 Referring Provider (PT): Butler Denmark, NP.   Encounter Date: 12/29/2019   PT End of Session - 12/29/19 1107    Visit Number 7    Number of Visits 9    Date for PT Re-Evaluation 01/16/20   POC for 5 weeks, Cert for 60 days   Authorization Type Medicaid    Authorization Time Period 3 Visits from 11/01/19 - 11/21/19, 5 visits from 8/06 - 9/09    Authorization - Visit Number 6    Authorization - Number of Visits 8    PT Start Time 1103    PT Stop Time 1145    PT Time Calculation (min) 42 min    Equipment Utilized During Treatment Gait belt    Activity Tolerance Patient limited by fatigue;Patient tolerated treatment well    Behavior During Therapy WFL for tasks assessed/performed           Past Medical History:  Diagnosis Date  . Abnormality of gait 11/28/2015  . BPH (benign prostatic hyperplasia)   . Depression   . Environmental and seasonal allergies   . History of hiatal hernia   . Hypertension   . Multiple sclerosis ([place])   . 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 APPENDECTOMY    . CLOSED REDUCTION NASAL FRACTURE  10-08-2004   and Septoplasty  . HERNIA REPAIR     pt states has had hiatal hernia repair   . HERNIA REPAIR    . left ankle surgery     . LUMBAR DISC SURGERY  11-02-2000   L4 -- L5  . LUMBAR DISC SURGERY  2002  . ORIF ANKLE FRACTURE Right 2008  . ORIF HUMERUS FRACTURE Right 2008  . ORIF RIGHT PROXIMAL HUMERUS FX AND ORIF RIGHT ANKLE FX  01-27-2007  . RIGHT SHOULDER MANIPULATION WITH HARDWARE REMOVAL/  RIGHT TIBIA NONUNION SITE  HARDWARE REMOVAL AND EXCHANGED AND BONE GRAFT  AND  ALLOGRAFT  05-10-2007  . SEPTOPLASTY  2006  . SHOULDER SURGERY Right 2009  . TRANSURETHRAL RESECTION OF PROSTATE N/A 10/28/2015   Procedure: TRANSURETHRAL RESECTION OF THE PROSTATE WITH GYRUS INSTRUMENTS;  Surgeon: Cleon Gustin, MD;  Location: [facility];  Service: Urology;  Laterality: N/A;  . TRANSURETHRAL RESECTION OF PROSTATE  10/28/2015    There were no vitals filed for this visit.   Subjective Assessment - 12/29/19 1106    Subjective No new complaints. No falls or pain to report. Does report he is walking from his bedroom, down the hall to the elevator and back 2x a day. "Then I am dead tired and have to lie down".    Pertinent History depression, HTN, MS with spastic quadriparesis; back surgery,     Limitations Walking;Standing;House hold activities    Patient Stated Goals wants to walk more    Currently in Pain? No/denies                [facility] Adult PT Treatment/Exercise - 12/29/19 1108      Transfers   Transfers Sit to Stand;Stand to Sit    Sit to Stand 5: Supervision;With upper extremity assist;From bed;From chair/3-in-1    Stand to Sit 5: Supervision;With upper extremity assist;To bed;To chair/3-in-1  Stand Pivot Transfers 4: Min guard    Stand Pivot Transfer Details (indicate cue type and reason) with RW power chair<>mat table      Ambulation/Gait   Ambulation/Gait Yes    Ambulation/Gait Assistance 4: Min guard    Ambulation/Gait Assistance Details cues for posture and walker position with gait. added simulated toe cap to left shoe with no scuffing noted with gait today. Pt given the information to obtain a permanent leather toe cap.     Ambulation Distance (Feet) 115 Feet    Assistive device Rolling walker    Gait Pattern Step-through pattern;Decreased step length - right;Decreased stance time - left;Decreased dorsiflexion - right;Decreased dorsiflexion - left;Left steppage;Right foot flat;Left foot flat;Right genu recurvatum;Left genu recurvatum;Narrow base of  support    Ambulation Surface Level;Indoor      Neuro Re-ed    Neuro Re-ed Details  for strengthening/muscle re-ed: standing with RW support- alternating foot taps to foam bubbles for 10 reps each side forward, then 10 reps each side laterally. min guard assist for balance.        Knee/Hip Exercises: Seated   Long Arc Quad Strengthening;Both;1 set;10 reps;Limitations    Long Arc Quad Weight --   2# on right, 1# on left LE   Long Arc Quad Limitations cues to slow down and go through full range.     Marching AROM;Strengthening;Both;1 set;10 reps;Limitations    Marching Limitations cues for controlled lowering    Marching Weights --   2# on right, 1# on left LE   Hamstring Curl Strengthening;Both;1 set;10 reps;Limitations    Hamstring Limitations yellow band on left, red band on right for resistance with cues for slow, controlled movements                PT Short Term Goals - 11/17/19 1407      PT SHORT TERM GOAL #1   Title Patient will be independent with initial HEP (TARGET: After initial 3 Visits)    Baseline Patient reports independence with exercises    Time 3    Period Weeks    Status Achieved      PT SHORT TERM GOAL #2   Title Patient will demonstrate ability to ambulate 50 ft, Mod I with rollator to demonstrate improved household mobility    Baseline 115 ft with CGA    Time 3    Period Weeks    Status Partially Met      PT SHORT TERM GOAL #3   Title Patient will improve 5x sit <> stand w/ UE to </= 30 secs to demo improved functional mobility and BLE strength    Baseline 39.53 secs, 7/22: 35.81 secs    Time 3    Period Weeks    Status On-going             PT Long Term Goals - 12/15/19 1557      PT LONG TERM GOAL #1   Title Patient will be independent in final HEP for BLE strengthening/stretching (Target: After 8th MCD Visit)    Time 8    Period Weeks    Status New    Target Date --   due to scheduling delay: after 8th MCD visit     PT LONG TERM GOAL  #2   Title Patient will demo ability to ambulate >/= 100 ft on level indoor surfaces, Mod I with rollator for improved household mobility    Time 8    Period Weeks    Status New  PT LONG TERM GOAL #3   Title Patient will improve gait speed to >/= 1.0 ft/sec to demo reduced risk for falls and improved mobility    Time 8    Period Weeks    Status New      PT LONG TERM GOAL #4   Title Patient will report understanding of fall prevention strategies and safe use of rollator to reduce risk for falls    Time 8    Period Weeks    Status New      PT LONG TERM GOAL #5   Title Patient will improve 5x sit <> stand to </= 25 secs to demo improved functional strength of BLE's    Time 8    Period Weeks    Status New                 Plan - 12/29/19 1108    Clinical Impression Statement Today's skiled session continued to focus on strengthening and gait with RW. Added simulated to cap to left foot with no scuffing noted with swing phase of gait. Pt given information on how to get a permanent one for his shoe for gait at home. The pt is progressing toward goals and should benefit from continued PT to progress toward unmet goals.    Personal Factors and Comorbidities Comorbidity 2;Time since onset of injury/illness/exacerbation;Other   progress nature of MS, social situation   Comorbidities HTN, Depression, MS with spastic quadparesis, back surgery    Examination-Activity Limitations Transfers;Stand;Stairs    Examination-Participation Restrictions Cleaning;Community Activity    Stability/Clinical Decision Making Evolving/Moderate complexity    Rehab Potential Good    PT Frequency 1x / week    PT Duration 3 weeks   followed by 1x/week for 5 weeks   PT Treatment/Interventions ADLs/Self Care Home Management;DME Instruction;Gait training;Stair training;Functional mobility training;Therapeutic activities;Therapeutic exercise;Balance training;Orthotic Fit/Training;Patient/family  education;Neuromuscular re-education;Wheelchair mobility training;Manual techniques;Energy conservation;Passive range of motion;Electrical Stimulation    PT Next Visit Plan BLE strengthening exercises, gait safety with RW/rollator    Consulted and Agree with Plan of Care Patient           Patient will benefit from skilled therapeutic intervention in order to improve the following deficits and impairments:  Abnormal gait, Decreased activity tolerance, Decreased balance, Decreased endurance, Decreased coordination, Decreased knowledge of use of DME, Decreased mobility, Decreased range of motion, Difficulty walking, Decreased strength, Increased muscle spasms, Increased fascial restricitons, Impaired sensation, Impaired tone, Postural dysfunction, Improper body mechanics, Pain  Visit Diagnosis: Other abnormalities of gait and mobility  Muscle weakness (generalized)  Foot drop, left     Problem List Patient Active Problem List   Diagnosis Date Noted  . Displaced fracture of lateral end of right clavicle, initial encounter for closed fracture 08/30/2017  . 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 Abnormality of gait 11/28/2015  . 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 BPH (benign prostatic hyperplasia) 10/28/2015  . Fracture of fibula with tibia, right, closed 12/09/2011  . Muscle weakness (generalized) 12/09/2011  . Difficulty in walking(719.7) 12/09/2011    Willow Ora, PTA, [facility] [address], [place], [place] [address] [phone] 12/30/19, 2:45 PM   Name: DYKE WEIBLE MRN: [id number] Date of Birth: 16-Jul-1965

## 2020-01-03 ENCOUNTER — Encounter: Payer: Self-pay | Admitting: Physical Therapy

## 2020-01-03 ENCOUNTER — Other Ambulatory Visit: Payer: Self-pay

## 2020-01-03 ENCOUNTER — Ambulatory Visit: Payer: Medicaid Other | Admitting: Physical Therapy

## 2020-01-03 DIAGNOSIS — R2689 Other abnormalities of gait and mobility: Secondary | ICD-10-CM | POA: Diagnosis not present

## 2020-01-03 DIAGNOSIS — M21372 Foot drop, left foot: Secondary | ICD-10-CM

## 2020-01-03 DIAGNOSIS — M6281 Muscle weakness (generalized): Secondary | ICD-10-CM

## 2020-01-03 NOTE — Patient Instructions (Addendum)
Fall Prevention in the Home, Adult Falls can cause injuries and can affect people from all age groups. There are many simple things that you can do to make your home safe and to help prevent falls. Ask for help when making these changes, if needed. What actions can I take to prevent falls? General instructions  Use good lighting in all rooms. Replace any light bulbs that burn out.  Turn on lights if it is dark. Use night-lights.  Place frequently used items in easy-to-reach places. Lower the shelves around your home if necessary.  Set up furniture so that there are clear paths around it. Avoid moving your furniture around.  Remove throw rugs and other tripping hazards from the floor.  Avoid walking on wet floors.  Fix any uneven floor surfaces.  Add color or contrast paint or tape to grab bars and handrails in your home. Place contrasting color strips on the first and last steps of stairways.  When you use a stepladder, make sure that it is completely opened and that the sides are firmly locked. Have someone hold the ladder while you are using it. Do not climb a closed stepladder.  Be aware of any and all pets. What can I do in the bathroom?      Keep the floor dry. Immediately clean up any water that spills onto the floor.  Remove soap buildup in the tub or shower on a regular basis.  Use non-skid mats or decals on the floor of the tub or shower.  Attach bath mats securely with double-sided, non-slip rug tape.  If you need to sit down while you are in the shower, use a plastic, non-slip stool.  Install grab bars by the toilet and in the tub and shower. Do not use towel bars as grab bars. What can I do in the bedroom?  Make sure that a bedside light is easy to reach.  Do not use oversized bedding that drapes onto the floor.  Have a firm chair that has side arms to use for getting dressed. What can I do in the kitchen?  Clean up any spills right away.  If you  need to reach for something above you, use a sturdy step stool that has a grab bar.  Keep electrical cables out of the way.  Do not use floor polish or wax that makes floors slippery. If you must use wax, make sure that it is non-skid floor wax. What can I do in the stairways?  Do not leave any items on the stairs.  Make sure that you have a light switch at the top of the stairs and the bottom of the stairs. Have them installed if you do not have them.  Make sure that there are handrails on both sides of the stairs. Fix handrails that are broken or loose. Make sure that handrails are as long as the stairways.  Install non-slip stair treads on all stairs in your home.  Avoid having throw rugs at the top or bottom of stairways, or secure the rugs with carpet tape to prevent them from moving.  Choose a carpet design that does not hide the edge of steps on the stairway.  Check any carpeting to make sure that it is firmly attached to the stairs. Fix any carpet that is loose or worn. What can I do on the outside of my home?  Use bright outdoor lighting.  Regularly repair the edges of walkways and driveways and fix any cracks.  Remove high doorway thresholds.  Trim any shrubbery on the main path into your home.  Regularly check that handrails are securely fastened and in good repair. Both sides of any steps should have handrails.  Install guardrails along the edges of any raised decks or porches.  Clear walkways of debris and clutter, including tools and rocks.  Have leaves, snow, and ice cleared regularly.  Use sand or salt on walkways during winter months.  In the garage, clean up any spills right away, including grease or oil spills. What other actions can I take?  Wear closed-toe shoes that fit well and support your feet. Wear shoes that have rubber soles or low heels.  Use mobility aids as needed, such as canes, walkers, scooters, and crutches.  Review your medicines with  your health care provider. Some medicines can cause dizziness or changes in blood pressure, which increase your risk of falling. Talk with your health care provider about other ways that you can decrease your risk of falls. This may include working with a physical therapist or trainer to improve your strength, balance, and endurance. Where to find more information  Centers for Disease Control and Prevention, [employer]: TVDivision.uy  [employer] on Aging: RingConnections.si Contact a health care provider if:  You are afraid of falling at home.  You feel weak, drowsy, or dizzy at home.  You fall at home. Summary  There are many simple things that you can do to make your home safe and to help prevent falls.  Ways to make your home safe include removing tripping hazards and installing grab bars in the bathroom.  Ask for help when making these changes in your home. This information is not intended to replace advice given to you by your health care provider. Make sure you discuss any questions you have with your health care provider. Document Revised: 03/26/2017 Document Reviewed: 11/26/2016 Elsevier Patient Education  2020 Elsevier Inc.   Updated HEP: Access Code: [email or web address] URL: [email or web address] Date: 01/03/2020 Prepared by: Sallyanne Kuster  Exercises Sit to Stand with Armchair - 2 x daily - 7 x weekly - 2 sets - 5 reps Supine March - 1 x daily - 7 x weekly - 2 sets - 10 reps Supine Bridge - 1 x daily - 7 x weekly - 2 sets - 10 reps Seated Knee Extension with Resistance - 1 x daily - 5 x weekly - 1 sets - 5-10 reps Seated Knee Flexion with Anchored Resistance - 1 x daily - 5 x weekly - 1 sets - 5-10 reps

## 2020-01-04 NOTE — Therapy (Addendum)
[facility] [address] [place], [place], [address] Phone: [phone]   Fax:  [phone]  Physical Therapy Treatment/Discharge Summary  Patient Details  Name: [name] MRN: [id number] Date of Birth: [birth year] Referring Provider (PT): Butler Denmark, NP.  PHYSICAL THERAPY DISCHARGE SUMMARY  Visits from Start of Care: 8  Current functional level related to goals / functional outcomes: See Clinical Impression Statement for Details   Remaining deficits: Decreased Strength, Fall Risk, Gait Abnormalities   Education / Equipment: Educated on updated HEP; continue walking program for 2 times a day; fall prevention strategies Plan: Patient agrees to discharge.  Patient goals were partially met. Patient is being discharged due to meeting the stated rehab goals.  ??Patient did demonstrate ability to partially meet goals at this time, but still demonstrate some difficulty with ambulation. Patient has received order for new power chair for functional mobility and will begin process of obtaining new power chair. ???   Addendum: Kaitlyn B. Hassell Done, PT, DPT             Encounter Date: 01/03/2020   PT End of Session - 01/03/20 0807    Visit Number 8    Number of Visits 9    Date for PT Re-Evaluation 01/16/20   POC for 5 weeks, Cert for 60 days   Authorization Type Medicaid    Authorization Time Period 3 Visits from 11/01/19 - 11/21/19, 5 visits from 8/06 - 9/09    Authorization - Visit Number 7    Authorization - Number of Visits 8    PT Start Time 0802    PT Stop Time 0845    PT Time Calculation (min) 43 min    Equipment Utilized During Treatment Gait belt    Activity Tolerance Patient limited by fatigue;Patient tolerated treatment well;No increased pain    Behavior During Therapy WFL for tasks assessed/performed           Past Medical History:  Diagnosis Date  . Abnormality of gait 11/28/2015  . BPH (benign  prostatic hyperplasia)   . Depression   . Environmental and seasonal allergies   . History of hiatal hernia   . Hypertension   . Multiple sclerosis ([place])   . 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 APPENDECTOMY    . CLOSED REDUCTION NASAL FRACTURE  10-08-2004   and Septoplasty  . HERNIA REPAIR     pt states has had hiatal hernia repair   . HERNIA REPAIR    . left ankle surgery     . LUMBAR DISC SURGERY  11-02-2000   L4 -- L5  . LUMBAR DISC SURGERY  2002  . ORIF ANKLE FRACTURE Right 2008  . ORIF HUMERUS FRACTURE Right 2008  . ORIF RIGHT PROXIMAL HUMERUS FX AND ORIF RIGHT ANKLE FX  01-27-2007  . RIGHT SHOULDER MANIPULATION WITH HARDWARE REMOVAL/  RIGHT TIBIA NONUNION SITE  HARDWARE REMOVAL AND EXCHANGED AND BONE GRAFT  AND ALLOGRAFT  05-10-2007  . SEPTOPLASTY  2006  . SHOULDER SURGERY Right 2009  . TRANSURETHRAL RESECTION OF PROSTATE N/A 10/28/2015   Procedure: TRANSURETHRAL RESECTION OF THE PROSTATE WITH GYRUS INSTRUMENTS;  Surgeon: Cleon Gustin, MD;  Location: [facility];  Service: Urology;  Laterality: N/A;  . TRANSURETHRAL RESECTION OF PROSTATE  10/28/2015    There were no vitals filed for this visit.   Subjective Assessment - 01/03/20 9937  Subjective No new complaints. No falls or pain to report. Still walking at home to elevator and back.    Pertinent History depression, HTN, MS with spastic quadriparesis; back surgery,     Limitations Walking;Standing;House hold activities    Patient Stated Goals wants to walk more    Currently in Pain? No/denies                   [facility] Adult PT Treatment/Exercise - 01/03/20 0808      Transfers   Transfers Sit to Stand;Stand to Sit    Sit to Stand 5: Supervision;With upper extremity assist;From bed;From chair/3-in-1    Five time sit to stand comments  27.97 sec with UE support from standard height on mat  table    Stand to Sit 5: Supervision;With upper extremity assist;To bed;To chair/3-in-1      Ambulation/Gait   Ambulation/Gait Yes    Ambulation/Gait Assistance 4: Min guard    Ambulation/Gait Assistance Details cues on posture and walker position with gait. simulated toe cap used on left shoe.     Ambulation Distance (Feet) 125 Feet   x1   Assistive device Rolling walker    Gait Pattern Step-through pattern;Decreased step length - right;Decreased stance time - left;Decreased dorsiflexion - right;Decreased dorsiflexion - left;Left steppage;Right foot flat;Left foot flat;Right genu recurvatum;Left genu recurvatum;Narrow base of support    Ambulation Surface Level;Indoor    Gait velocity 84.25 sec's= 0.39 ft/sec with RW      Self-Care   Self-Care Other Self-Care Comments    Other Self-Care Comments  reviewed fall prevention strategies with pt. Handout provided.; discussed that primary PT has reached out to [facility] with [employer] about replacement power chair. All needed paperwork has been submitted and they can use his most recent wheelchair eval for the replacement. He should be hearing from [employer] about a new chair.       Exercises   Exercises Other Exercises    Other Exercises  reviewed current HEP, addded 2 new seated ex's with no issues reported or noted in session. Refer to [place] for full details.           Issued the following to pt's HEP today: Access Code: [email or web address] URL: [email or web address] Date: 01/03/2020 Prepared by: Willow Ora  Exercises Sit to Stand with Armchair - 2 x daily - 7 x weekly - 2 sets - 5 reps Supine March - 1 x daily - 7 x weekly - 2 sets - 10 reps Supine Bridge - 1 x daily - 7 x weekly - 2 sets - 10 reps  Added the following today: Seated Knee Extension with Resistance - 1 x daily - 5 x weekly - 1 sets - 5-10 reps Seated Knee Flexion with Anchored Resistance - 1 x daily - 5 x weekly - 1 sets - 5-10 reps        PT Education - 01/03/20  0819    Education Details updated HEP; continue walking program for 2 times a day; fall prevention strategies.    Person(s) Educated Patient    Methods Explanation;Demonstration;Verbal cues;Handout    Comprehension Verbalized understanding;Returned demonstration;Verbal cues required            PT Short Term Goals - 11/17/19 1407      PT SHORT TERM GOAL #1   Title Patient will be independent with initial HEP (TARGET: After initial 3 Visits)    Baseline Patient reports independence with exercises    Time 3    Period Weeks  Status Achieved      PT SHORT TERM GOAL #2   Title Patient will demonstrate ability to ambulate 50 ft, Mod I with rollator to demonstrate improved household mobility    Baseline 115 ft with CGA    Time 3    Period Weeks    Status Partially Met      PT SHORT TERM GOAL #3   Title Patient will improve 5x sit <> stand w/ UE to </= 30 secs to demo improved functional mobility and BLE strength    Baseline 39.53 secs, 7/22: 35.81 secs    Time 3    Period Weeks    Status On-going             PT Long Term Goals - 01/03/20 6644      PT LONG TERM GOAL #1   Title Patient will be independent in final HEP for BLE strengthening/stretching (Target: After 8th MCD Visit)    Baseline 01/03/20: pt reports no issues. added 2 additional ones with no issues.    Time --    Period --    Status Achieved      PT LONG TERM GOAL #2   Title Patient will demo ability to ambulate >/= 100 ft on level indoor surfaces, Mod I with rollator for improved household mobility    Baseline 01/03/20: min guard assist to supervision needed for >100 feet with RW today    Time --    Period --    Status Partially Met      PT LONG TERM GOAL #3   Title Patient will improve gait speed to >/= 1.0 ft/sec to demo reduced risk for falls and improved mobility    Baseline 01/03/20: 0.39 ft/sec with RW, decreased from last assessment    Time --    Period --    Status Not Met      PT LONG TERM GOAL #4    Title Patient will report understanding of fall prevention strategies and safe use of rollator to reduce risk for falls    Baseline 01/03/20: met in session today    Time --    Period --    Status Achieved      PT LONG TERM GOAL #5   Title Patient will improve 5x sit <> stand to </= 25 secs to demo improved functional strength of BLE's    Baseline 01/03/20: 27.97 sec's with UE support from standard height surface, improved just not to goal    Time --    Period --    Status Partially Met             01/03/20 0807  Plan  Clinical Impression Statement Today's skilled session focused on progress toward LTGs for anticipated discharge. Pt did improve his 5 time sit to stand, just not to goal level. Pt demo'd decreased 10 meter gait speed not meeting that goal. He did meet his HEP and fall prevention goals. Pt continues to need min guard to supervision with gait with RW partially meeting that goal. Pt is agreeable to discharge today.  Personal Factors and Comorbidities Comorbidity 2;Time since onset of injury/illness/exacerbation;Other (progress nature of MS, social situation)  Comorbidities HTN, Depression, MS with spastic quadparesis, back surgery  Examination-Activity Limitations Transfers;Stand;Stairs  Examination-Participation Restrictions Cleaning;Community Activity  Pt will benefit from skilled therapeutic intervention in order to improve on the following deficits Abnormal gait;Decreased activity tolerance;Decreased balance;Decreased endurance;Decreased coordination;Decreased knowledge of use of DME;Decreased mobility;Decreased range of motion;Difficulty walking;Decreased strength;Increased muscle spasms;Increased  fascial restricitons;Impaired sensation;Impaired tone;Postural dysfunction;Improper body mechanics;Pain  Stability/Clinical Decision Making Evolving/Moderate complexity  Rehab Potential Good  PT Frequency 1x / week  PT Duration 3 weeks (followed by 1x/week for 5 weeks)  PT  Treatment/Interventions ADLs/Self Care Home Management;DME Instruction;Gait training;Stair training;Functional mobility training;Therapeutic activities;Therapeutic exercise;Balance training;Orthotic Fit/Training;Patient/family education;Neuromuscular re-education;Wheelchair mobility training;Manual techniques;Energy conservation;Passive range of motion;Electrical Stimulation  PT Next Visit Plan discharge per PT plan  PT Home Exercise Plan Access Code: [email or web address]  Consulted and Agree with Plan of Care Patient         Patient will benefit from skilled therapeutic intervention in order to improve the following deficits and impairments:  Abnormal gait, Decreased activity tolerance, Decreased balance, Decreased endurance, Decreased coordination, Decreased knowledge of use of DME, Decreased mobility, Decreased range of motion, Difficulty walking, Decreased strength, Increased muscle spasms, Increased fascial restricitons, Impaired sensation, Impaired tone, Postural dysfunction, Improper body mechanics, Pain  Visit Diagnosis: Other abnormalities of gait and mobility  Muscle weakness (generalized)  Foot drop, left     Problem List Patient Active Problem List   Diagnosis Date Noted  . Displaced fracture of lateral end of right clavicle, initial encounter for closed fracture 08/30/2017  . 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 Abnormality of gait 11/28/2015  . 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 BPH (benign prostatic hyperplasia) 10/28/2015  . Fracture of fibula with tibia, right, closed 12/09/2011  . Muscle weakness (generalized) 12/09/2011  . Difficulty in walking(719.7) 12/09/2011    Willow Ora, PTA, [facility] [address] [place], [place] [address] [phone] 01/04/20, 1:14 PM   Addendum by: Eldred Manges. Hassell Done, PT, DPT   Name: [name] MRN: [id number] Date of Birth: 02-09-1966

## 2020-01-05 ENCOUNTER — Ambulatory Visit: Payer: Medicaid Other

## 2020-02-24 ENCOUNTER — Other Ambulatory Visit: Payer: Self-pay | Admitting: Neurology

## 2020-02-26 ENCOUNTER — Other Ambulatory Visit: Payer: Self-pay | Admitting: Neurology

## 2020-02-26 MED FILL — OCREVUS 300 MG/10ML SOLN: 300 | 34 days supply | Qty: 20 | Fill #0

## 2020-03-07 ENCOUNTER — Ambulatory Visit: Payer: Medicaid Other | Admitting: Neurology

## 2020-03-07 ENCOUNTER — Encounter: Payer: Self-pay | Admitting: Neurology

## 2020-03-07 VITALS — BP 128/84 | HR 79 | Ht 73.0 in

## 2020-03-07 DIAGNOSIS — G8250 Quadriplegia, unspecified: Secondary | ICD-10-CM

## 2020-03-07 DIAGNOSIS — R269 Unspecified abnormalities of gait and mobility: Secondary | ICD-10-CM | POA: Diagnosis not present

## 2020-03-07 DIAGNOSIS — G35 Multiple sclerosis: Secondary | ICD-10-CM | POA: Diagnosis not present

## 2020-03-07 MED ORDER — GABAPENTIN 300 MG PO CAPS: 300.0000 mg | ORAL_CAPSULE | Freq: Two times a day (BID) | ORAL | 1 refills | Status: AC

## 2020-03-07 NOTE — Progress Notes (Signed)
I have read the note, and I agree with the clinical assessment and plan.  Kaenan Jake K Miquel Lamson   

## 2020-03-07 NOTE — Progress Notes (Signed)
PATIENT: [name] DOB: 1965-10-15  REASON FOR VISIT: follow up HISTORY FROM: patient  HISTORY OF PRESENT ILLNESS: Today 03/07/20 [name] is a 54 year old male with history of multiple sclerosis with a spastic quadriparesis.  He has gait disorder, mainly uses a wheelchair.  He remains on Ocrevus, next infusion 11/24.  He is on baclofen for spasticity of the legs, gabapentin for leg pain.  He lives in an independent living apartment, takes a bus to run errands.  Last MRI of the brain was done in November 2019, showing stability.  He completed physical therapy, with good benefit.  Is supposed to be getting a new electric wheelchair.  He has had 2 falls, lost his balance, getting back into his wheelchair.  He is able to do some slow walking with a walker "10 mph with w/c, 1 mph with walker".  No incontinence of bowels or bladder.  No new problems or concerns, left leg is chronically stiffer than right.  He has a girlfriend, who comes to visit.  He presents today for evaluation unaccompanied.  HISTORY 09/05/2019 SS: [name] is a 54 year old male with history of multiple sclerosis with a spastic quadriparesis.  He has a gait disorder, mainly uses a wheelchair, but he does have a walker.  He remains on Ocrevus and is tolerating well, he missed his appointment by accident yesterday for infusion.  He remains on baclofen for spasticity of his legs, gabapentin for leg pain, these are beneficial.  Last MRI of the brain was done in November 2019, showing stability from 2 years prior.  Denies any new problems or exacerbating symptoms.  Does feel like he would benefit from some physical therapy to strengthen his legs.  He continues to live independently in an apartment, does his own daily activities, takes the bus to run errands, wants to keep it this way.  He has urinary urgency.  He is now wearing a heart monitor, due to low BP from his PCP he says.  He says he has not had a recent fall.  At home,  he mostly uses a walker, he can walk somewhat independently, if he has something to brace against, such as a wall.  He presents today for evaluation unaccompanied.   REVIEW OF SYSTEMS: Out of a complete 14 system review of symptoms, the patient complains only of the following symptoms, and all other reviewed systems are negative.  Walking difficulty  ALLERGIES: Allergies  Allergen Reactions   Morphine And Related Hives   Morphine And Related Hives    HOME MEDICATIONS: Outpatient Medications Prior to Visit  Medication Sig Dispense Refill   acetaminophen (TYLENOL) 500 MG tablet Take 1,000 mg by mouth every 6 (six) hours as needed.     baclofen (LIORESAL) 20 MG tablet TAKE 1 TABLET (20 MG TOTAL) BY MOUTH 4 (FOUR) TIMES DAILY. 360 tablet 1   cyclobenzaprine (FLEXERIL) 10 MG tablet Take 1 tablet (10 mg total) by mouth 2 (two) times daily as needed for muscle spasms. 20 tablet 0   FLUoxetine (PROZAC) 40 MG capsule Take 40 mg by mouth every morning.     ibuprofen (ADVIL) 600 MG tablet Take 1 tablet (600 mg total) by mouth every 6 (six) hours as needed. 30 tablet 0   lisinopril-hydrochlorothiazide (PRINZIDE,ZESTORETIC) 20-12.5 MG tablet Take 1 tablet by mouth every morning.      montelukast (SINGULAIR) 10 MG tablet Take 10 mg by mouth every morning.      OCREVUS 300 MG/10ML injection INJECT  20 MLS INTRAVENOUSLY EVERY 6 MONTHS 20 mL 1   traZODone (DESYREL) 150 MG tablet Take 75 mg by mouth at bedtime.      gabapentin (NEURONTIN) 300 MG capsule Take 1 capsule (300 mg total) by mouth 2 (two) times daily. 60 capsule 6   No facility-administered medications prior to visit.    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    PAST SURGICAL HISTORY: Past Surgical History:  Procedure Laterality Date   APPENDECTOMY     CLOSED REDUCTION NASAL FRACTURE  10-08-2004   and Septoplasty   HERNIA REPAIR     pt states has had hiatal hernia repair    HERNIA REPAIR     left ankle surgery      LUMBAR DISC SURGERY  11-02-2000   L4 -- L5   LUMBAR DISC SURGERY  2002   ORIF ANKLE FRACTURE Right 2008   ORIF HUMERUS FRACTURE Right 2008   ORIF RIGHT PROXIMAL HUMERUS FX AND ORIF RIGHT ANKLE FX  01-27-2007   RIGHT SHOULDER MANIPULATION WITH HARDWARE REMOVAL/  RIGHT TIBIA NONUNION SITE  HARDWARE REMOVAL AND EXCHANGED AND BONE GRAFT  AND ALLOGRAFT  05-10-2007   SEPTOPLASTY  2006   SHOULDER SURGERY Right 2009   TRANSURETHRAL RESECTION OF PROSTATE N/A 10/28/2015   Procedure: TRANSURETHRAL RESECTION OF THE PROSTATE WITH GYRUS INSTRUMENTS;  Surgeon: Malen Gauze, MD;  Location: [facility];  Service: Urology;  Laterality: N/A;   TRANSURETHRAL RESECTION OF PROSTATE  10/28/2015    FAMILY HISTORY: Family History  Problem Relation Age of Onset   Colon cancer Mother     SOCIAL HISTORY: Social History   Socioeconomic History   Marital status: Divorced    Spouse name: Not on file   Number of children: 1   Years of education: 12   Highest education level: Not on file  Occupational History   Occupation: N/A  Tobacco Use   Smoking status: Current Every Day Smoker    Packs/day: 0.50    Years: 30.00    Pack years: 15.00    Types: Cigarettes   Smokeless tobacco: Never Used  Substance and Sexual Activity   Alcohol use: Not Currently    Comment: Drinks beer occasionally   Drug use: Not Currently    Types: Cocaine, Marijuana    Comment:  cocaine use--  per pt last cocaine Feb 2017   Sexual activity: Not on file  Other Topics Concern   Not on file  Social History Narrative   ** Merged History Encounter **       Lives  alone Right-handed Caffeine: 2 cups of coffee and 20 oz soda/day   Social Determinants of Health   Financial Resource Strain:    Difficulty of Paying Living Expenses: Not on file  Food Insecurity:    Worried About Programme researcher, broadcasting/film/video in the Last Year: Not on file   [employer] of Food in the Last Year: Not on file  Transportation Needs:    Lack of Transportation (Medical): Not on file   Lack of Transportation (Non-Medical): Not on file  Physical Activity:    Days of Exercise per Week: Not on file  Minutes of Exercise per Session: Not on file  Stress:    Feeling of Stress : Not on file  Social Connections:    Frequency of Communication with Friends and Family: Not on file   Frequency of Social Gatherings with Friends and Family: Not on file   Attends Religious Services: Not on file   Active Member of Clubs or Organizations: Not on file   Attends Banker Meetings: Not on file   Marital Status: Not on file  Intimate Partner Violence:    Fear of Current or Ex-Partner: Not on file   Emotionally Abused: Not on file   Physically Abused: Not on file   Sexually Abused: Not on file   PHYSICAL EXAM  Vitals:   03/07/20 1337  BP: 128/84  Pulse: 79  Height: 6\' 1"  (1.854 m)   Body mass index is 24.41 kg/m.  Generalized: Well developed, in no acute distress   Neurological examination  Mentation: Alert oriented to time, place, history taking. Follows all commands speech and language fluent Cranial nerve II-XII: Pupils were equal round reactive to light. Extraocular movements were full, visual field were full on confrontational test. Facial sensation and strength were normal. Head turning and shoulder shrug  were normal and symmetric. Motor: Good strength of upper extremities, lower extremities, 4/5 right hip flexion, 3/5 left hip flexion, increased tone in lower extremities, left greater than right Sensory: Sensory testing is intact to soft touch on all 4  extremities. No evidence of extinction is noted.  Coordination: Cerebellar testing reveals good finger-nose-finger bilaterally, difficulty with lower extremities Gait and station: In electric wheelchair, gait was not attempted Reflexes: Deep tendon reflexes are symmetric but somewhat increased throughout  DIAGNOSTIC DATA (LABS, IMAGING, TESTING) - I reviewed patient records, labs, notes, testing and imaging myself where available.  Lab Results  Component Value Date   WBC 5.0 09/05/2019   HGB 14.8 09/05/2019   HCT 43.8 09/05/2019   MCV 93 09/05/2019   PLT 223 09/05/2019      Component Value Date/Time   NA 142 09/05/2019 1322   K 4.6 09/05/2019 1322   CL 105 09/05/2019 1322   CO2 23 09/05/2019 1322   GLUCOSE 86 09/05/2019 1322   GLUCOSE 133 (H) 08/20/2017 1204   BUN 10 09/05/2019 1322   CREATININE 1.27 09/05/2019 1322   CALCIUM 10.4 (H) 09/05/2019 1322   PROT 7.5 09/05/2019 1322   ALBUMIN 4.8 09/05/2019 1322   AST 15 09/05/2019 1322   ALT 10 09/05/2019 1322   ALKPHOS 62 09/05/2019 1322   BILITOT 0.7 09/05/2019 1322   GFRNONAA 63 09/05/2019 1322   GFRAA 73 09/05/2019 1322   No results found for: CHOL, HDL, LDLCALC, LDLDIRECT, TRIG, CHOLHDL No results found for: 11/05/2019 Lab Results  Component Value Date   VITAMINB12 381 03/16/2016   No results found for: TSH  ASSESSMENT AND PLAN 54 y.o. 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 here with:  1.  Multiple sclerosis, primary progressive 2.  Spastic quadriparesis 3.  Gait disorder  -Remained stable since last seen -Continue Ocrevus every 6 months (next infusion is 03/20/20) -Check routine blood work today (IgG, IgA, IgM were within normal  range for Ocrevus in May 2021) -We will order MRI of the brain with and without contrast for  routine surveillance -Continue baclofen and gabapentin -Responded well to physical therapy, getting new w/c soon  -Follow-up in 6 months or sooner if needed  I spent 30 minutes of face-to-face and non-face-to-face time with patient.  This included previsit chart review, lab review, study review, order entry, electronic health record documentation, patient education.  Margie Ege, AGNP-C, DNP 03/07/2020, 2:09 PM [facility] [address] [place], [place] [address] [phone]

## 2020-03-07 NOTE — Patient Instructions (Signed)
Check MRI of the brain  Check blood work today  Keep appointment for [employer]  See you back in 6 months

## 2020-03-08 LAB — COMPREHENSIVE METABOLIC PANEL
ALT: 15 IU/L (ref 0–44)
AST: 13 IU/L (ref 0–40)
Albumin/Globulin Ratio: 1.7 (ref 1.2–2.2)
Albumin: 4.8 g/dL (ref 3.8–4.9)
Alkaline Phosphatase: 66 IU/L (ref 44–121)
BUN/Creatinine Ratio: 12 (ref 9–20)
BUN: 15 mg/dL (ref 6–24)
Bilirubin Total: 0.4 mg/dL (ref 0.0–1.2)
CO2: 22 mmol/L (ref 20–29)
Calcium: 10.3 mg/dL — ABNORMAL HIGH (ref 8.7–10.2)
Chloride: 106 mmol/L (ref 96–106)
Creatinine, Ser: 1.21 mg/dL (ref 0.76–1.27)
GFR calc Af Amer: 77 mL/min/{1.73_m2} (ref >59)
GFR calc non Af Amer: 67 mL/min/{1.73_m2} (ref >59)
Globulin, Total: 2.8 g/dL (ref 1.5–4.5)
Glucose: 81 mg/dL (ref 65–99)
Potassium: 4.6 mmol/L (ref 3.5–5.2)
Sodium: 142 mmol/L (ref 134–144)
Total Protein: 7.6 g/dL (ref 6.0–8.5)

## 2020-03-08 LAB — CBC WITH DIFFERENTIAL/PLATELET
Basophils Absolute: 0.1 10*3/uL (ref 0.0–0.2)
Basos: 1 %
EOS (ABSOLUTE): 0.1 10*3/uL (ref 0.0–0.4)
Eos: 2 %
Hematocrit: 42.4 % (ref 37.5–51.0)
Hemoglobin: 14.7 g/dL (ref 13.0–17.7)
Immature Grans (Abs): 0 10*3/uL (ref 0.0–0.1)
Immature Granulocytes: 0 %
Lymphocytes Absolute: 1.8 10*3/uL (ref 0.7–3.1)
Lymphs: 33 %
MCH: 32 pg (ref 26.6–33.0)
MCHC: 34.7 g/dL (ref 31.5–35.7)
MCV: 92 fL (ref 79–97)
Monocytes Absolute: 0.5 10*3/uL (ref 0.1–0.9)
Monocytes: 9 %
Neutrophils Absolute: 3.1 10*3/uL (ref 1.4–7.0)
Neutrophils: 55 %
Platelets: 220 10*3/uL (ref 150–450)
RBC: 4.59 x10E6/uL (ref 4.14–5.80)
RDW: 12.9 % (ref 11.6–15.4)
WBC: 5.6 10*3/uL (ref 3.4–10.8)

## 2020-03-11 ENCOUNTER — Telehealth: Payer: Self-pay | Admitting: Neurology

## 2020-03-11 NOTE — Telephone Encounter (Signed)
medicaid order sent to [facility]. They will reach out to the patient to schedule  °

## 2020-03-18 ENCOUNTER — Other Ambulatory Visit: Payer: Medicaid Other

## 2020-03-20 ENCOUNTER — Encounter (HOSPITAL_COMMUNITY): Payer: Medicaid Other

## 2020-03-21 ENCOUNTER — Ambulatory Visit (HOSPITAL_COMMUNITY): Payer: Medicaid Other

## 2020-03-25 ENCOUNTER — Other Ambulatory Visit: Payer: Medicaid Other

## 2020-03-27 ENCOUNTER — Encounter (HOSPITAL_COMMUNITY): Payer: Medicaid Other

## 2020-04-02 ENCOUNTER — Other Ambulatory Visit: Payer: Self-pay

## 2020-04-02 ENCOUNTER — Ambulatory Visit
Admission: RE | Admit: 2020-04-02 | Discharge: 2020-04-02 | Disposition: A | Discharge status: Home / Self Care | Payer: Medicaid Other | Source: Ambulatory Visit | Attending: Neurology | Admitting: Neurology

## 2020-04-02 DIAGNOSIS — G35 Multiple sclerosis: Secondary | ICD-10-CM

## 2020-04-02 MED ORDER — GADOBENATE DIMEGLUMINE 529 MG/ML IV SOLN
17.0000 mL | Freq: Once | INTRAVENOUS | Status: AC | PRN
  Administered 2020-04-02: 17 mL via INTRAVENOUS

## 2020-04-10 ENCOUNTER — Other Ambulatory Visit: Payer: Self-pay | Admitting: Internal Medicine

## 2020-04-10 DIAGNOSIS — Z0001 Encounter for general adult medical examination with abnormal findings: Secondary | ICD-10-CM

## 2020-04-15 ENCOUNTER — Encounter: Payer: Self-pay | Admitting: Neurology

## 2020-04-15 ENCOUNTER — Other Ambulatory Visit: Payer: Self-pay | Admitting: Neurology

## 2020-04-15 ENCOUNTER — Telehealth: Payer: Self-pay | Admitting: Neurology

## 2020-04-15 ENCOUNTER — Telehealth (HOSPITAL_COMMUNITY): Payer: Self-pay

## 2020-04-15 DIAGNOSIS — G35 Multiple sclerosis: Secondary | ICD-10-CM

## 2020-04-15 NOTE — Telephone Encounter (Signed)
Morrie Sheldon from [facility] called stating that she is needing the [facility] written order for this pt faxed to [phone]

## 2020-04-15 NOTE — Telephone Encounter (Signed)
Spoke to [facility].  Pt is scheduled tomorrow for ocrevus infusion at 1030 and needs orders.  (place as sign and held please).  Dr. Anne Hahn will be sent message as he places in as hospital orders.  She stated no PA needed.

## 2020-04-15 NOTE — Telephone Encounter (Signed)
Spoke with [name] at [facility]. Need physician order to administer Ocrevus on 04/16/20 as scheduled. Andrey Campanile RN states she will have the prescribing MD Stephanie Acre place sign and held order in Epic.

## 2020-04-15 NOTE — Telephone Encounter (Signed)
I will write the orders for the Ocrevus.

## 2020-04-16 ENCOUNTER — Ambulatory Visit (HOSPITAL_COMMUNITY)
Admission: RE | Admit: 2020-04-16 | Discharge: 2020-04-16 | Disposition: A | Discharge status: Home / Self Care | Payer: Medicaid Other | Source: Ambulatory Visit | Attending: Internal Medicine | Admitting: Internal Medicine

## 2020-04-16 ENCOUNTER — Other Ambulatory Visit: Payer: Self-pay

## 2020-04-16 DIAGNOSIS — G35 Multiple sclerosis: Secondary | ICD-10-CM | POA: Diagnosis present

## 2020-04-16 MED ORDER — DIPHENHYDRAMINE HCL 50 MG/ML IJ SOLN
25.0000 mg | Freq: Once | INTRAMUSCULAR | Status: AC
  Administered 2020-04-16: 25 mg via INTRAVENOUS
  Filled 2020-04-16: qty 1

## 2020-04-16 MED ORDER — SODIUM CHLORIDE 0.9 % IV SOLN
INTRAVENOUS | Status: DC | PRN
  Administered 2020-04-16: 250 mL via INTRAVENOUS

## 2020-04-16 MED ORDER — SODIUM CHLORIDE 0.9 % IV SOLN
600.0000 mg | Freq: Once | INTRAVENOUS | Status: AC
  Administered 2020-04-16: 600 mg via INTRAVENOUS
  Filled 2020-04-16: qty 20

## 2020-04-16 MED ORDER — ACETAMINOPHEN 325 MG PO TABS
650.0000 mg | ORAL_TABLET | Freq: Once | ORAL | Status: AC
  Administered 2020-04-16: 650 mg via ORAL
  Filled 2020-04-16: qty 2

## 2020-04-16 MED ORDER — METHYLPREDNISOLONE SODIUM SUCC 125 MG IJ SOLR
100.0000 mg | Freq: Once | INTRAMUSCULAR | Status: AC
  Administered 2020-04-16: 100 mg via INTRAVENOUS
  Filled 2020-04-16: qty 2

## 2020-04-16 NOTE — Discharge Instructions (Signed)
Ocrelizumab injection °What is this medicine? °OCRELIZUMAB (ok re LIZ ue mab) treats multiple sclerosis. It helps to decrease the number of multiple sclerosis relapses. It is not a cure. °This medicine may be used for other purposes; ask your health care provider or pharmacist if you have questions. °COMMON BRAND NAME(S): OCREVUS °What should I tell my health care provider before I take this medicine? °They need to know if you have any of these conditions: °· cancer °· hepatitis B infection °· other infection (especially a virus infection such as chickenpox, cold sores, or herpes) °· an unusual or allergic reaction to ocrelizumab, other medicines, foods, dyes or preservatives °· pregnant or trying to get pregnant °· breast-feeding °How should I use this medicine? °This medicine is for infusion into a vein. It is given by a health care professional in a hospital or clinic setting. °A special MedGuide will be given to you before each treatment. Be sure to read this information carefully each time. °Talk to your pediatrician regarding the use of this medicine in children. Special care may be needed. °Overdosage: If you think you have taken too much of this medicine contact a poison control center or emergency room at once. °NOTE: This medicine is only for you. Do not share this medicine with others. °What if I miss a dose? °Keep appointments for follow-up doses as directed. It is important not to miss your dose. Call your doctor or health care professional if you are unable to keep an appointment. °What may interact with this medicine? °· alemtuzumab °· daclizumab °· dimethyl fumarate °· fingolimod °· glatiramer °· interferon beta °· live virus vaccines °· mitoxantrone °· natalizumab °· peginterferon beta °· rituximab °· steroid medicines like prednisone or cortisone °· teriflunomide °This list may not describe all possible interactions. Give your health care provider a list of all the medicines, herbs,  non-prescription drugs, or dietary supplements you use. Also tell them if you smoke, drink alcohol, or use illegal drugs. Some items may interact with your medicine. °What should I watch for while using this medicine? °Tell your doctor or healthcare professional if your symptoms do not start to get better or if they get worse. °This medicine can cause serious allergic reactions. To reduce your risk you may need to take medicine before treatment with this medicine. Take your medicine as directed. °Women should inform their doctor if they wish to become pregnant or think they might be pregnant. There is a potential for serious side effects to an unborn child. Talk to your health care professional or pharmacist for more information. Male patients should use effective birth control methods while receiving this medicine and for 6 months after the last dose. °Call your doctor or health care professional for advice if you get a fever, chills or sore throat, or other symptoms of a cold or flu. Do not treat yourself. This drug decreases your body's ability to fight infections. Try to avoid being around people who are sick. °If you have a hepatitis B infection or a history of a hepatitis B infection, talk to your doctor. The symptoms of hepatitis B may get worse if you take this medicine. °In some patients, this medicine may cause a serious brain infection that may cause death. If you have any problems seeing, thinking, speaking, walking, or standing, tell your doctor right away. If you cannot reach your doctor, urgently seek other source of medical care. °This medicine can decrease the response to a vaccine. If you need to get   vaccinated, tell your healthcare professional if you have received this medicine. Extra booster doses may be needed. Talk to your doctor to see if a different vaccination schedule is needed. °Talk to your doctor about your risk of cancer. You may be more at risk for certain types of cancers if you  take this medicine. °What side effects may I notice from receiving this medicine? °Side effects that you should report to your doctor or health care professional as soon as possible: °· allergic reactions like skin rash, itching or hives, swelling of the face, lips, or tongue °· breathing problems °· facial flushing °· fast, irregular heartbeat °· lump or soreness in the breast °· signs and symptoms of herpes such as cold sore, shingles, or genital sores °· signs and symptoms of infection like fever or chills, cough, sore throat, pain or trouble passing urine °· signs and symptoms of low blood pressure like dizziness; feeling faint or lightheaded, falls; unusually weak or tired °· signs and symptoms of progressive multifocal leukoencephalopathy (PML) like changes in vision; clumsiness; confusion; personality changes; weakness on one side of the body °· swelling of the ankles, feet, hands °Side effects that usually do not require medical attention (report these to your doctor or health care professional if they continue or are bothersome): °· back pain °· depressed mood °· diarrhea °· pain, redness, or irritation at site where injected °This list may not describe all possible side effects. Call your doctor for medical advice about side effects. You may report side effects to FDA at 1-800-FDA-1088. °Where should I keep my medicine? °This drug is given in a hospital or clinic and will not be stored at home. °NOTE: This sheet is a summary. It may not cover all possible information. If you have questions about this medicine, talk to your doctor, pharmacist, or health care provider. °© 2020 [employer]vier/Gold Standard (2018-04-18 07:41:53) ° °

## 2020-04-16 NOTE — Progress Notes (Signed)
Patient received IV ocrevus as ordered by Stephanie Acre MD. Pre infusion medications - Tylenol PO,Benadryl IV, IV Solu-medrol were given. Medication was titrated per protocol. Patient declined to wait for the 60 minutes post infusion observation. Tolerated well, vitals stable, discharge instructions given, verbalized understanding. Patient alert, oriented and  at the time of discharge

## 2020-04-23 ENCOUNTER — Emergency Department (HOSPITAL_COMMUNITY): Payer: Medicaid Other

## 2020-04-23 ENCOUNTER — Emergency Department (HOSPITAL_COMMUNITY)
Admission: EM | Admit: 2020-04-23 | Discharge: 2020-04-23 | Disposition: A | Discharge status: Home / Self Care | Payer: Medicaid Other | Attending: Emergency Medicine | Admitting: Emergency Medicine

## 2020-04-23 ENCOUNTER — Other Ambulatory Visit: Payer: Self-pay

## 2020-04-23 DIAGNOSIS — M545 Low back pain, unspecified: Secondary | ICD-10-CM | POA: Insufficient documentation

## 2020-04-23 DIAGNOSIS — I10 Essential (primary) hypertension: Secondary | ICD-10-CM | POA: Diagnosis not present

## 2020-04-23 DIAGNOSIS — Z79899 Other long term (current) drug therapy: Secondary | ICD-10-CM | POA: Diagnosis not present

## 2020-04-23 DIAGNOSIS — F17210 Nicotine dependence, cigarettes, uncomplicated: Secondary | ICD-10-CM | POA: Diagnosis not present

## 2020-04-23 MED ORDER — CYCLOBENZAPRINE HCL 10 MG PO TABS
10.0000 mg | ORAL_TABLET | Freq: Three times a day (TID) | ORAL | 0 refills | Status: DC | PRN
Start: 2020-04-23 — End: 2020-09-05

## 2020-04-23 MED ORDER — CYCLOBENZAPRINE HCL 10 MG PO TABS
10.0000 mg | ORAL_TABLET | Freq: Once | ORAL | Status: AC
  Administered 2020-04-23: 10 mg via ORAL
  Filled 2020-04-23: qty 1

## 2020-04-23 MED ORDER — OXYCODONE-ACETAMINOPHEN 5-325 MG PO TABS
1.0000 | ORAL_TABLET | Freq: Once | ORAL | Status: AC
  Administered 2020-04-23: 1 via ORAL
  Filled 2020-04-23: qty 1

## 2020-04-23 MED ORDER — OXYCODONE-ACETAMINOPHEN 5-325 MG PO TABS: 1.0000 | ORAL_TABLET | Freq: Four times a day (QID) | ORAL | 0 refills | Status: DC | PRN

## 2020-04-23 NOTE — ED Triage Notes (Signed)
Per EMS, pt was going down bouncy road in his wheelchair 3 days ago and has back pain. He tried to treat pain at home with tylenol and rest but pain is not improving. Hx of MS, L4/L5 surgery. Pain is 6/10 w/ movement, seems comfortable when still.   126/90 HR 74 SpO2 98% Temp 97.9

## 2020-04-23 NOTE — ED Provider Notes (Signed)
[facility]-EMERGENCY DEPT Provider Note   CSN: [id number] Arrival date & time: 04/23/20  1001     History Chief Complaint  Patient presents with   Back Pain    [name] is a 54 y.o. male.  Presents to ER with concern for back pain.  Reports that he has a history of multiple sclerosis, uses a walker or wheelchair at times.  States that he was going over bumpy stretch with his wheelchair when he suddenly had low back pain.  Pain is worse with movement, improved with rest.  Currently mild at rest but becomes severe with minimal movement.  Sharp, stabbing, does not radiate.  Has not taken any medication yet.  No fevers, no bladder or bowel incontinence.  No numbness or weakness in his extremities.  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    Patient Active Problem List   Diagnosis Date Noted   Displaced fracture of lateral end of right clavicle, initial encounter for closed fracture 08/30/2017   Multiple sclerosis, 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   Abnormality of gait 11/28/2015   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   BPH (benign prostatic hyperplasia) 10/28/2015   Fracture of fibula with tibia, right, closed 12/09/2011   Muscle weakness (generalized) 12/09/2011   Difficulty in walking(719.7) 12/09/2011    Past Surgical History:  Procedure Laterality Date   APPENDECTOMY     CLOSED REDUCTION NASAL FRACTURE  10-08-2004   and Septoplasty   HERNIA REPAIR     pt states has had hiatal hernia repair    HERNIA REPAIR     left ankle surgery      LUMBAR DISC SURGERY  11-02-2000   L4 -- L5   LUMBAR DISC SURGERY  2002    ORIF ANKLE FRACTURE Right 2008   ORIF HUMERUS FRACTURE Right 2008   ORIF RIGHT PROXIMAL HUMERUS FX AND ORIF RIGHT ANKLE FX  01-27-2007   RIGHT SHOULDER MANIPULATION WITH HARDWARE REMOVAL/  RIGHT TIBIA NONUNION SITE  HARDWARE REMOVAL AND EXCHANGED AND BONE GRAFT  AND ALLOGRAFT  05-10-2007   SEPTOPLASTY  2006   SHOULDER SURGERY Right 2009   TRANSURETHRAL RESECTION OF PROSTATE N/A 10/28/2015   Procedure: TRANSURETHRAL RESECTION OF THE PROSTATE WITH GYRUS INSTRUMENTS;  Surgeon: Malen Gauze, MD;  Location: [facility];  Service: Urology;  Laterality: N/A;   TRANSURETHRAL RESECTION OF PROSTATE  10/28/2015       Family History  Problem Relation Age of Onset   Colon cancer Mother     Social History   Tobacco Use   Smoking status: Current Every Day Smoker    Packs/day: 0.50    Years: 30.00    Pack years: 15.00    Types: Cigarettes   Smokeless tobacco: Never Used  Substance Use Topics   Alcohol use: Not Currently    Comment: Drinks beer occasionally   Drug use: Not Currently    Types: Cocaine, Marijuana    Comment:  cocaine use--  per pt last cocaine Feb 2017    Home Medications Prior to Admission medications   Medication Sig Start Date End Date Taking? Authorizing Provider  oxyCODONE-acetaminophen (PERCOCET/ROXICET) 5-325 MG tablet  Take 1 tablet by mouth every 6 (six) hours as needed for up to 3 days for severe pain. 04/23/20 04/26/20 Yes Kayson Tasker, Quitman Livings, MD  acetaminophen (TYLENOL) 500 MG tablet Take 1,000 mg by mouth every 6 (six) hours as needed.    [provider]  baclofen (LIORESAL) 20 MG tablet TAKE 1 TABLET (20 MG TOTAL) BY MOUTH 4 (FOUR) TIMES DAILY. 02/26/20   Glean Salvo, NP  cyclobenzaprine (FLEXERIL) 10 MG tablet Take 1 tablet (10 mg total) by mouth 3 (three) times daily as needed for muscle spasms. 04/23/20   Milagros Loll, MD  FLUoxetine (PROZAC) 40 MG capsule Take 40 mg by mouth every morning.    [provider]  gabapentin  (NEURONTIN) 300 MG capsule Take 1 capsule (300 mg total) by mouth 2 (two) times daily. 03/07/20   Glean Salvo, NP  ibuprofen (ADVIL) 600 MG tablet Take 1 tablet (600 mg total) by mouth every 6 (six) hours as needed. 05/25/19   Fayrene Helper, PA-C  lisinopril-hydrochlorothiazide (PRINZIDE,ZESTORETIC) 20-12.5 MG tablet Take 1 tablet by mouth every morning.     [provider]  montelukast (SINGULAIR) 10 MG tablet Take 10 mg by mouth every morning.     [provider]  OCREVUS 300 MG/10ML injection INJECT 20 MLS INTRAVENOUSLY EVERY 6 MONTHS 02/26/20   York Spaniel, MD  traZODone (DESYREL) 150 MG tablet Take 75 mg by mouth at bedtime.     [provider]    Allergies    Morphine and related and Morphine and related  Review of Systems   Review of Systems  Constitutional: Negative for chills and fever.  HENT: Negative for ear pain and sore throat.   Eyes: Negative for pain and visual disturbance.  Respiratory: Negative for cough and shortness of breath.   Cardiovascular: Negative for chest pain and palpitations.  Gastrointestinal: Negative for abdominal pain and vomiting.  Genitourinary: Negative for dysuria and hematuria.  Musculoskeletal: Positive for back pain. Negative for arthralgias.  Skin: Negative for color change and rash.  Neurological: Negative for seizures and syncope.  All other systems reviewed and are negative.   Physical Exam Updated Vital Signs BP 122/82    Pulse 64    Temp 98.9 F (37.2 C) (Oral)    Resp 18    SpO2 97%   Physical Exam Vitals and nursing note reviewed.  Constitutional:      Appearance: He is well-developed and well-nourished.  HENT:     Head: Normocephalic and atraumatic.  Eyes:     Conjunctiva/sclera: Conjunctivae normal.  Cardiovascular:     Rate and Rhythm: Normal rate and regular rhythm.     Heart sounds: No murmur heard.   Pulmonary:     Effort: Pulmonary effort is normal. No respiratory distress.     Breath  sounds: Normal breath sounds.  Abdominal:     Palpations: Abdomen is soft.     Tenderness: There is no abdominal tenderness.  Musculoskeletal:        General: No edema.     Cervical back: Neck supple.     Comments: Tenderness across lumbar region, no step-off or deformity noted  Skin:    General: Skin is warm and dry.  Neurological:     Mental Status: He is alert.     Comments: 5 out of 5 strength in lower extremities, sensation intact throughout lower extremities  Psychiatric:        Mood and Affect: Mood and affect normal.  ED Results / Procedures / Treatments   Labs (all labs ordered are listed, but only abnormal results are displayed) Labs Reviewed - No data to display  EKG None  Radiology DG Lumbar Spine Complete  Result Date: 04/23/2020 CLINICAL DATA:  54 year old male with a history of back pain EXAM: LUMBAR SPINE - COMPLETE 4+ VIEW COMPARISON:  None. FINDINGS: Lumbar Spine: Lumbar vertebral elements maintain normal alignment without evidence of anterolisthesis, retrolisthesis, subluxation. No acute fracture line identified. Vertebral body heights maintained. Disc space heights maintained with no significant degenerative disc disease or endplate changes. Oblique images demonstrate no displaced pars defect No significant facet changes. Unremarkable appearance of the visualized abdomen. IMPRESSION: Negative for acute fracture or malalignment of the lumbar spine Electronically Signed   By: Gilmer Mor D.O.   On: 04/23/2020 12:16    Procedures Procedures (including critical care time)  Medications Ordered in ED Medications  oxyCODONE-acetaminophen (PERCOCET/ROXICET) 5-325 MG per tablet 1 tablet (1 tablet Oral Given 04/23/20 1129)  cyclobenzaprine (FLEXERIL) tablet 10 mg (10 mg Oral Given 04/23/20 1419)    ED Course  I have reviewed the triage vital signs and the nursing notes.  Pertinent labs & imaging results that were available during my care of the patient were  reviewed by me and considered in my medical decision making (see chart for details).    MDM Rules/Calculators/A&P                         54 year old male presenting to ER with concern for low back pain.  Neuro intact.  No red flag symptoms.  Plain films negative for acute acute process.  Symptoms controlled in ER with oral medications.  Recommend follow-up with primary doctor.  Patient endorses remote spine surgery history, recommend he follow-up also with his spine surgeon.   After the discussed management above, the patient was determined to be safe for discharge.  The patient was in agreement with this plan and all questions regarding their care were answered.  ED return precautions were discussed and the patient will return to the ED with any significant worsening of condition.  Final Clinical Impression(s) / ED Diagnoses Final diagnoses:  Low back pain, unspecified back pain laterality, unspecified chronicity, unspecified whether sciatica present    Rx / DC Orders ED Discharge Orders         Ordered    cyclobenzaprine (FLEXERIL) 10 MG tablet  3 times daily PRN        04/23/20 1333    oxyCODONE-acetaminophen (PERCOCET/ROXICET) 5-325 MG tablet  Every 6 hours PRN        04/23/20 1333           Milagros Loll, MD 04/24/20 1511

## 2020-04-23 NOTE — Discharge Instructions (Signed)
Take medication as needed for pain and muscle cramps.  Return to ER if you develop uncontrolled pain, numbness, weakness, or other new concerning symptom.  Recommend follow-up with your primary doctor, neurology and neurosurgery.

## 2020-04-23 NOTE — ED Notes (Signed)
RN spoke to [facility] representative Annice Pih who reports someone from [facility] will come get patient.

## 2020-04-23 NOTE — ED Notes (Signed)
Pt assisted to [facility] van using [employer] [facility].  [facility] Zenaida Niece arrived with pts home wheelchair.  Pt assisted to transfer from steady to wheelchair.  No further needs expressed, pt provided with discharge paperwork at time of discharge.

## 2020-04-23 NOTE — ED Notes (Signed)
Patient dressed and ready for transportation

## 2020-04-24 ENCOUNTER — Telehealth (HOSPITAL_COMMUNITY): Payer: Self-pay | Admitting: Emergency Medicine

## 2020-04-24 NOTE — ED Provider Notes (Signed)
10:25 AM-patient contacted the ED secretary to request that his prescriptions for oxycodone and cyclobenzaprine be sent to [facility].  I am unable to select this as a pharmacy, to send his medications to.  Therefore he will have to pick up his medications at the pharmacy where they were sent to yesterday.    Mancel Bale, MD 04/24/20 1029

## 2020-05-02 ENCOUNTER — Other Ambulatory Visit: Payer: Self-pay

## 2020-05-02 ENCOUNTER — Ambulatory Visit
Admission: RE | Admit: 2020-05-02 | Discharge: 2020-05-02 | Disposition: A | Discharge status: Home / Self Care | Payer: No Typology Code available for payment source | Source: Ambulatory Visit | Attending: Internal Medicine | Admitting: Internal Medicine

## 2020-05-02 ENCOUNTER — Other Ambulatory Visit: Payer: Self-pay | Admitting: Internal Medicine

## 2020-05-02 DIAGNOSIS — Z0001 Encounter for general adult medical examination with abnormal findings: Secondary | ICD-10-CM

## 2020-05-02 DIAGNOSIS — Z122 Encounter for screening for malignant neoplasm of respiratory organs: Secondary | ICD-10-CM

## 2020-07-11 ENCOUNTER — Other Ambulatory Visit: Payer: Self-pay | Admitting: Internal Medicine

## 2020-07-11 ENCOUNTER — Other Ambulatory Visit: Payer: Self-pay

## 2020-07-11 DIAGNOSIS — I824Y9 Acute embolism and thrombosis of unspecified deep veins of unspecified proximal lower extremity: Secondary | ICD-10-CM

## 2020-07-12 ENCOUNTER — Ambulatory Visit
Admission: RE | Admit: 2020-07-12 | Discharge: 2020-07-12 | Disposition: A | Discharge status: Home / Self Care | Payer: Medicaid Other | Source: Ambulatory Visit | Attending: Internal Medicine | Admitting: Internal Medicine

## 2020-07-12 DIAGNOSIS — I824Y9 Acute embolism and thrombosis of unspecified deep veins of unspecified proximal lower extremity: Secondary | ICD-10-CM

## 2020-08-19 ENCOUNTER — Encounter: Payer: Self-pay | Admitting: Physician Assistant

## 2020-09-04 ENCOUNTER — Ambulatory Visit: Payer: Medicaid Other | Admitting: Physician Assistant

## 2020-09-05 ENCOUNTER — Other Ambulatory Visit: Payer: Self-pay

## 2020-09-05 ENCOUNTER — Telehealth: Payer: Self-pay | Admitting: *Deleted

## 2020-09-05 ENCOUNTER — Ambulatory Visit (INDEPENDENT_AMBULATORY_CARE_PROVIDER_SITE_OTHER): Payer: Medicaid Other | Admitting: Neurology

## 2020-09-05 ENCOUNTER — Encounter: Payer: Self-pay | Admitting: Neurology

## 2020-09-05 VITALS — BP 118/69 | HR 61 | Wt 205.0 lb

## 2020-09-05 DIAGNOSIS — R269 Unspecified abnormalities of gait and mobility: Secondary | ICD-10-CM | POA: Diagnosis not present

## 2020-09-05 DIAGNOSIS — G8250 Quadriplegia, unspecified: Secondary | ICD-10-CM

## 2020-09-05 DIAGNOSIS — G35 Multiple sclerosis: Secondary | ICD-10-CM

## 2020-09-05 MED ORDER — TIZANIDINE HCL 2 MG PO CAPS: 2.0000 mg | ORAL_CAPSULE | Freq: Three times a day (TID) | ORAL | 5 refills | Status: DC

## 2020-09-05 NOTE — Patient Instructions (Addendum)
Check labs today  Order for PT  Check labs today  Add on Tizanidine 2 mg 3 times daily for spasticity  Continue Ocrevus See you back in 6 months

## 2020-09-05 NOTE — Progress Notes (Signed)
I have read the note, and I agree with the clinical assessment and plan.  Marvia Troost K Turner Baillie   

## 2020-09-05 NOTE — Telephone Encounter (Signed)
Fax confirmation received for zanaflex to [facility].

## 2020-09-05 NOTE — Telephone Encounter (Signed)
Faxed ofv note with PT recommendations to: [facility] [address], [place]  [address] Office: [phone]-fax.  Received fax confirmation.

## 2020-09-05 NOTE — Addendum Note (Signed)
Addended by: Glean Salvo on: 09/05/2020 09:48 AM   Modules accepted: Orders

## 2020-09-05 NOTE — Progress Notes (Addendum)
PATIENT: [name] DOB: 1966-04-27  REASON FOR VISIT: follow up HISTORY FROM: patient  HISTORY OF PRESENT ILLNESS: Today 09/05/20 [name] is a 55 year old male with history of multiple sclerosis with spastic quadriparesis.  Has gait disorder, and uses wheelchair.  He is on Ocrevus.  On baclofen for spasticity of the legs, gabapentin for leg pain.  MRI of the brain in December 2021 was overall stable, moderate to severe chronic demyelinating disease, no change from November 2019. Last infusion was in Dec 2021, next is in June. Feels by end of Ocrevus cycle, feels it wearing off, gets weaker. Living in an apartment alone, got a life alert device. Takes the bus. Is going to start using his walker when he goes out. Has had 3 falls, lose his balance/legs are stiff, once getting out of bed missed wheelchair, had to call EMS to get him up, another time using walker fell backwards. Did PT mid last year, good benefit. Would like more therapy, work on leg strength. Goes to bathroom on his own, no accidents. Has joined with [facility] 6 months ago, sending pill packs, he got a new wheelchair. Feels getting stiffer.  Here today for evaluation unaccompanied.  Update 03/07/2020 SS: [name] is a 55 year old male with history of multiple sclerosis with a spastic quadriparesis.  He has gait disorder, mainly uses a wheelchair.  He remains on Ocrevus, next infusion 11/24.  He is on baclofen for spasticity of the legs, gabapentin for leg pain.  He lives in an independent living apartment, takes a bus to run errands.  Last MRI of the brain was done in November 2019, showing stability.  He completed physical therapy, with good benefit.  Is supposed to be getting a new electric wheelchair.  He has had 2 falls, lost his balance, getting back into his wheelchair.  He is able to do some slow walking with a walker "10 mph with w/c, 1 mph with walker".  No incontinence of bowels or bladder.  No new problems or  concerns, left leg is chronically stiffer than right.  He has a girlfriend, who comes to visit.  He presents today for evaluation unaccompanied.  HISTORY 09/05/2019 SS: [name] is a 55 year old male with history of multiple sclerosis with a spastic quadriparesis.  He has a gait disorder, mainly uses a wheelchair, but he does have a walker.  He remains on Ocrevus and is tolerating well, he missed his appointment by accident yesterday for infusion.  He remains on baclofen for spasticity of his legs, gabapentin for leg pain, these are beneficial.  Last MRI of the brain was done in November 2019, showing stability from 2 years prior.  Denies any new problems or exacerbating symptoms.  Does feel like he would benefit from some physical therapy to strengthen his legs.  He continues to live independently in an apartment, does his own daily activities, takes the bus to run errands, wants to keep it this way.  He has urinary urgency.  He is now wearing a heart monitor, due to low BP from his PCP he says.  He says he has not had a recent fall.  At home, he mostly uses a walker, he can walk somewhat independently, if he has something to brace against, such as a wall.  He presents today for evaluation unaccompanied.   REVIEW OF SYSTEMS: Out of a complete 14 system review of symptoms, the patient complains only of the following symptoms, and all other reviewed systems are  negative.  Walking difficulty  ALLERGIES: Allergies  Allergen Reactions  . Morphine And Related Hives  . Morphine And Related Hives    HOME MEDICATIONS: Outpatient Medications Prior to Visit  Medication Sig Dispense Refill  . acetaminophen (TYLENOL) 500 MG tablet Take 1,000 mg by mouth every 6 (six) hours as needed.    . baclofen (LIORESAL) 20 MG tablet TAKE 1 TABLET (20 MG TOTAL) BY MOUTH 4 (FOUR) TIMES DAILY. 360 tablet 1  . cyclobenzaprine (FLEXERIL) 10 MG tablet Take 1 tablet (10 mg total) by mouth 3 (three) times daily as needed for  muscle spasms. 20 tablet 0  . FLUoxetine (PROZAC) 40 MG capsule Take 40 mg by mouth every morning.    . gabapentin (NEURONTIN) 300 MG capsule Take 1 capsule (300 mg total) by mouth 2 (two) times daily. 180 capsule 1  . ibuprofen (ADVIL) 600 MG tablet Take 1 tablet (600 mg total) by mouth every 6 (six) hours as needed. 30 tablet 0  . lisinopril-hydrochlorothiazide (PRINZIDE,ZESTORETIC) 20-12.5 MG tablet Take 1 tablet by mouth every morning.     . montelukast (SINGULAIR) 10 MG tablet Take 10 mg by mouth every morning.     Marland Kitchen ocrelizumab (OCREVUS) 300 MG/10ML injection INJECT 20 MLS INTRAVENOUSLY EVERY 6 MONTHS 20 mL 1  . traZODone (DESYREL) 150 MG tablet Take 75 mg by mouth at bedtime.      No facility-administered medications prior to visit.    PAST MEDICAL HISTORY: Past Medical History:  Diagnosis Date  . Abnormality of gait 11/28/2015  . BPH (benign prostatic hyperplasia)   . Depression   . Environmental and seasonal allergies   . History of hiatal hernia   . Hypertension   . Multiple sclerosis (HCC)   . 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 APPENDECTOMY    . CLOSED REDUCTION NASAL FRACTURE  10-08-2004   and Septoplasty  . HERNIA REPAIR     pt states has had hiatal hernia repair   . HERNIA REPAIR    . left ankle surgery     . LUMBAR DISC SURGERY  11-02-2000   L4 -- L5  . LUMBAR DISC SURGERY  2002  . ORIF ANKLE FRACTURE Right 2008  . ORIF HUMERUS FRACTURE Right 2008  . ORIF RIGHT PROXIMAL HUMERUS FX AND ORIF RIGHT ANKLE FX  01-27-2007  . RIGHT SHOULDER MANIPULATION WITH HARDWARE REMOVAL/  RIGHT TIBIA NONUNION SITE  HARDWARE REMOVAL AND EXCHANGED AND BONE GRAFT  AND ALLOGRAFT  05-10-2007  . SEPTOPLASTY  2006  . SHOULDER SURGERY Right 2009  . TRANSURETHRAL RESECTION OF PROSTATE N/A 10/28/2015   Procedure: TRANSURETHRAL RESECTION OF THE  PROSTATE WITH GYRUS INSTRUMENTS;  Surgeon: Malen Gauze, MD;  Location: [facility];  Service: Urology;  Laterality: N/A;  . TRANSURETHRAL RESECTION OF PROSTATE  10/28/2015    FAMILY HISTORY: Family History  Problem Relation Age of Onset  . Colon cancer Mother     SOCIAL HISTORY: Social History   Socioeconomic History  . Marital status: Divorced    Spouse name: Not on file  . Number of children: 1  . Years of education: 63  . Highest education level: Not on file  Occupational History  . Occupation: N/A  Tobacco Use  . Smoking status: Current Every Day Smoker    Packs/day: 0.50    Years: 30.00    Pack years: 15.00  Types: Cigarettes  . Smokeless tobacco: Never Used  Substance and Sexual Activity  . Alcohol use: Not Currently    Comment: Drinks beer occasionally  . Drug use: Not Currently    Types: Cocaine, Marijuana    Comment:  cocaine use--  per pt last cocaine Feb 2017  . Sexual activity: Not on file  Other Topics Concern  . Not on file  Social History Narrative   ** Merged History Encounter **       Lives alone Right-handed Caffeine: 2 cups of coffee and 20 oz soda/day   Social Determinants of Health   Financial Resource Strain: Not on file  Food Insecurity: Not on file  Transportation Needs: Not on file  Physical Activity: Not on file  Stress: Not on file  Social Connections: Not on file  Intimate Partner Violence: Not on file   PHYSICAL EXAM  Vitals:   09/05/20 0904  BP: 118/69  Pulse: 61  Weight: 205 lb (93 kg)   Body mass index is 27.05 kg/m.  Generalized: Well developed, in no acute distress   Neurological examination  Mentation: Alert oriented to time, place, history taking. Follows all commands speech and language fluent Cranial nerve II-XII: Pupils were equal round reactive to light. Extraocular movements were full, visual field were full on confrontational test. Facial sensation and strength were normal. Head turning and shoulder  shrug  were normal and symmetric. Motor: Good strength of upper extremities, lower extremities, 3.5/5 right hip flexion, 3/5 left hip flexion, increased tone in lower extremities, left greater than right Sensory: Sensory testing is intact to soft touch on all 4 extremities. No evidence of extinction is noted.  Coordination: Cerebellar testing reveals good finger-nose-finger bilaterally, difficulty with lower extremities Gait and station: In electric wheelchair, gait was not attempted Reflexes: Deep tendon reflexes are symmetric but somewhat increased throughout  DIAGNOSTIC DATA (LABS, IMAGING, TESTING) - I reviewed patient records, labs, notes, testing and imaging myself where available.  Lab Results  Component Value Date   WBC 5.6 03/07/2020   HGB 14.7 03/07/2020   HCT 42.4 03/07/2020   MCV 92 03/07/2020   PLT 220 03/07/2020      Component Value Date/Time   NA 142 03/07/2020 1413   K 4.6 03/07/2020 1413   CL 106 03/07/2020 1413   CO2 22 03/07/2020 1413   GLUCOSE 81 03/07/2020 1413   GLUCOSE 133 (H) 08/20/2017 1204   BUN 15 03/07/2020 1413   CREATININE 1.21 03/07/2020 1413   CALCIUM 10.3 (H) 03/07/2020 1413   PROT 7.6 03/07/2020 1413   ALBUMIN 4.8 03/07/2020 1413   AST 13 03/07/2020 1413   ALT 15 03/07/2020 1413   ALKPHOS 66 03/07/2020 1413   BILITOT 0.4 03/07/2020 1413   GFRNONAA 67 03/07/2020 1413   GFRAA 77 03/07/2020 1413   No results found for: CHOL, HDL, LDLCALC, LDLDIRECT, TRIG, CHOLHDL No results found for: HFWY6V Lab Results  Component Value Date   VITAMINB12 381 03/16/2016   No results found for: TSH  ASSESSMENT AND PLAN 55 y.o. 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 here  with:  1.  Multiple sclerosis, primary progressive 2.  Spastic quadriparesis 3.  Gait disorder  -is now part of the [facility] program  -more spasticity noted, will add in tizanidine 2 mg  3 times daily, already on max dose of Baclofen 20 mg 4 times daily; Flexeril was on his list on PRN back in Dec, indicates not taking, just need to make sure with [facility] -MRI of the brain in December 2021 was overall stable, no change from November 2019, showed moderate to severe chronic demyelinating disease -Will continue Ocrevus, next infusion is in June -Check routine labs today, including IgG, IgA, IgM -Would like to order physical therapy for gait, balance training, and leg strengthening; will need to send office note to [facility] so it can be done there, hopefully the tizanidine will loosen things up for therapy  -Has a new wheelchair now, life alert, he is trying to be safer at home -Follow-up in 6 months or sooner if needed  Otila Kluver, DNP 09/05/2020, 9:37 AM [facility] [address], [address] [place], [place] [address] [phone]

## 2020-09-06 LAB — COMPREHENSIVE METABOLIC PANEL
ALT: 11 IU/L (ref 0–44)
AST: 17 IU/L (ref 0–40)
Albumin/Globulin Ratio: 1.8 (ref 1.2–2.2)
Albumin: 5.1 g/dL — ABNORMAL HIGH (ref 3.8–4.9)
Alkaline Phosphatase: 72 IU/L (ref 44–121)
BUN/Creatinine Ratio: 11 (ref 9–20)
BUN: 20 mg/dL (ref 6–24)
Bilirubin Total: 0.3 mg/dL (ref 0.0–1.2)
CO2: 24 mmol/L (ref 20–29)
Calcium: 10.5 mg/dL — ABNORMAL HIGH (ref 8.7–10.2)
Chloride: 104 mmol/L (ref 96–106)
Creatinine, Ser: 1.86 mg/dL — ABNORMAL HIGH (ref 0.76–1.27)
Globulin, Total: 2.9 g/dL (ref 1.5–4.5)
Glucose: 91 mg/dL (ref 65–99)
Potassium: 4.6 mmol/L (ref 3.5–5.2)
Sodium: 143 mmol/L (ref 134–144)
Total Protein: 8 g/dL (ref 6.0–8.5)
eGFR: 42 mL/min/{1.73_m2} — ABNORMAL LOW (ref >59)

## 2020-09-06 LAB — CBC WITH DIFFERENTIAL/PLATELET
Basophils Absolute: 0.1 10*3/uL (ref 0.0–0.2)
Basos: 1 %
EOS (ABSOLUTE): 0.1 10*3/uL (ref 0.0–0.4)
Eos: 1 %
Hematocrit: 39.9 % (ref 37.5–51.0)
Hemoglobin: 13.1 g/dL (ref 13.0–17.7)
Immature Grans (Abs): 0 10*3/uL (ref 0.0–0.1)
Immature Granulocytes: 0 %
Lymphocytes Absolute: 2.1 10*3/uL (ref 0.7–3.1)
Lymphs: 36 %
MCH: 30.3 pg (ref 26.6–33.0)
MCHC: 32.8 g/dL (ref 31.5–35.7)
MCV: 92 fL (ref 79–97)
Monocytes Absolute: 0.4 10*3/uL (ref 0.1–0.9)
Monocytes: 8 %
Neutrophils Absolute: 3.1 10*3/uL (ref 1.4–7.0)
Neutrophils: 54 %
Platelets: 293 10*3/uL (ref 150–450)
RBC: 4.33 x10E6/uL (ref 4.14–5.80)
RDW: 12.7 % (ref 11.6–15.4)
WBC: 5.7 10*3/uL (ref 3.4–10.8)

## 2020-09-06 LAB — IGG, IGA, IGM
IgA/Immunoglobulin A, Serum: 231 mg/dL (ref 90–386)
IgG (Immunoglobin G), Serum: 1306 mg/dL (ref 603–1613)
IgM (Immunoglobulin M), Srm: 62 mg/dL (ref 20–172)

## 2020-09-09 ENCOUNTER — Telehealth: Payer: Self-pay | Admitting: *Deleted

## 2020-09-09 NOTE — Telephone Encounter (Signed)
I called patient and gave him the lab results. He will [facility]-up with [facility] in regards to his creatinine and calcium.  His IgG IgM IgA were okay for him to continue his Ocrevus every 6 months.  He verbalized understanding.  Fax confirmation received to [facility] to f/u creat and ca.  [phone].

## 2020-09-09 NOTE — Telephone Encounter (Signed)
-----   Message from Glean Salvo, NP sent at 09/09/2020  8:31 AM EDT ----- CBC was unremarkable, CMP showed elevated creatinine higher than 6 months ago 1.86, calcium 10.5.  This should be rechecked by PCP, please send to [facility] with attention to creatinine.  IgG, IgA, IgM are okay to continue ocrevus every 6 months.

## 2020-09-20 ENCOUNTER — Other Ambulatory Visit (HOSPITAL_COMMUNITY): Payer: Self-pay

## 2020-09-20 MED FILL — Ocrelizumab Soln For IV Infusion 300 MG/10ML: INTRAVENOUS | 90 days supply | Qty: 20 | Fill #0 | Status: CN

## 2020-09-25 ENCOUNTER — Other Ambulatory Visit: Payer: Self-pay | Admitting: *Deleted

## 2020-09-25 ENCOUNTER — Other Ambulatory Visit (HOSPITAL_COMMUNITY): Payer: Self-pay | Admitting: *Deleted

## 2020-09-25 ENCOUNTER — Non-Acute Institutional Stay (HOSPITAL_COMMUNITY)
Admission: RE | Admit: 2020-09-25 | Discharge: 2020-09-25 | Disposition: A | Discharge status: Home / Self Care | Payer: Medicaid Other | Source: Ambulatory Visit

## 2020-09-25 ENCOUNTER — Other Ambulatory Visit: Payer: Self-pay | Admitting: Neurology

## 2020-09-25 ENCOUNTER — Telehealth: Payer: Self-pay | Admitting: *Deleted

## 2020-09-25 DIAGNOSIS — G35 Multiple sclerosis: Secondary | ICD-10-CM

## 2020-09-25 NOTE — Telephone Encounter (Signed)
Order placed for home infusion  Ocrelizumab 600 mg Premedicated with Benadryl 25 mg p.o. Solu-Medrol 125 mg IV Famotidine 20 mg IV Tylenol 650 mg p.o.

## 2020-10-03 ENCOUNTER — Other Ambulatory Visit: Payer: Self-pay | Admitting: Internal Medicine

## 2020-10-03 DIAGNOSIS — M25562 Pain in left knee: Secondary | ICD-10-CM

## 2020-10-15 ENCOUNTER — Non-Acute Institutional Stay (HOSPITAL_COMMUNITY)
Admission: RE | Admit: 2020-10-15 | Discharge: 2020-10-15 | Disposition: A | Discharge status: Home / Self Care | Payer: Medicaid Other | Source: Ambulatory Visit | Attending: Internal Medicine | Admitting: Internal Medicine

## 2020-10-15 ENCOUNTER — Non-Acute Institutional Stay (HOSPITAL_COMMUNITY)
Admission: RE | Admit: 2020-10-15 | Discharge: 2020-10-15 | Disposition: A | Discharge status: Home / Self Care | Payer: Medicaid Other | Source: Ambulatory Visit | Attending: Family Medicine | Admitting: Family Medicine

## 2020-10-15 ENCOUNTER — Other Ambulatory Visit: Payer: Self-pay

## 2020-10-15 DIAGNOSIS — G35 Multiple sclerosis: Secondary | ICD-10-CM | POA: Insufficient documentation

## 2020-10-15 MED ORDER — DIPHENHYDRAMINE HCL 25 MG PO CAPS
25.0000 mg | ORAL_CAPSULE | Freq: Once | ORAL | Status: AC
  Administered 2020-10-15: 25 mg via ORAL
  Filled 2020-10-15: qty 1

## 2020-10-15 MED ORDER — SODIUM CHLORIDE 0.9 % IV SOLN
INTRAVENOUS | Status: DC | PRN
  Administered 2020-10-15: 250 mL via INTRAVENOUS

## 2020-10-15 MED ORDER — FAMOTIDINE IN NACL 20-0.9 MG/50ML-% IV SOLN
20.0000 mg | Freq: Once | INTRAVENOUS | Status: AC
  Administered 2020-10-15: 20 mg via INTRAVENOUS
  Filled 2020-10-15: qty 50

## 2020-10-15 MED ORDER — METHYLPREDNISOLONE SODIUM SUCC 125 MG IJ SOLR
125.0000 mg | Freq: Once | INTRAMUSCULAR | Status: AC
  Administered 2020-10-15: 125 mg via INTRAVENOUS
  Filled 2020-10-15: qty 2

## 2020-10-15 MED ORDER — ACETAMINOPHEN 325 MG PO TABS
650.0000 mg | ORAL_TABLET | Freq: Every day | ORAL | Status: AC
Start: 2020-10-15 — End: 2020-10-15
  Administered 2020-10-15: 650 mg via ORAL
  Filled 2020-10-15: qty 2

## 2020-10-15 MED ORDER — OCRELIZUMAB 300 MG/10ML IV SOLN
600.0000 mg | Freq: Once | INTRAVENOUS | Status: AC
  Administered 2020-10-15: 600 mg via INTRAVENOUS
  Filled 2020-10-15: qty 20

## 2020-10-15 NOTE — Progress Notes (Signed)
PATIENT CARE CENTER NOTE    Diagnosis: Multiple sclerosis (HCC) (G35)      Provider: Levert Feinstein, MD     Procedure: Ocrevus 600 mg infusion     Note: Patient received Ocrevus infusion via PIV. Pre-medications given per order. Infusion titrated per protocol. No adverse reaction noted. Vital signs stable. Patient declined the 1 hour observation post-infusion. Discharge instructions given. Patient alert, oriented and transferred in motorized chair at discharge.

## 2020-10-18 ENCOUNTER — Ambulatory Visit
Admission: RE | Admit: 2020-10-18 | Discharge: 2020-10-18 | Disposition: A | Discharge status: Home / Self Care | Payer: Medicaid Other | Source: Ambulatory Visit | Attending: Internal Medicine | Admitting: Internal Medicine

## 2020-10-18 ENCOUNTER — Other Ambulatory Visit: Payer: Self-pay

## 2020-10-18 DIAGNOSIS — M25562 Pain in left knee: Secondary | ICD-10-CM

## 2020-10-21 NOTE — H&P (Signed)
error 

## 2020-11-14 NOTE — Discharge Summary (Signed)
Order placed for ocrelizumab 

## 2020-11-14 NOTE — H&P (Signed)
Order placed for ocrelizumab

## 2021-03-24 ENCOUNTER — Telehealth: Payer: Self-pay | Admitting: Neurology

## 2021-03-24 NOTE — Telephone Encounter (Signed)
LVM for patient to call back to reschedule because Maralyn Sago is out this week. There are some reschedule days held at the end of December.

## 2021-03-25 ENCOUNTER — Ambulatory Visit: Payer: No Typology Code available for payment source | Admitting: Neurology

## 2021-04-14 ENCOUNTER — Telehealth: Payer: Self-pay | Admitting: *Deleted

## 2021-04-14 NOTE — Telephone Encounter (Signed)
Received message Monday:  [place] is scheduled to come to [place] Wednesday for his Ocrevus infusion. He needs undated orders placed? His last orders were placed by Dr. Terrace Arabia. I'm not sure if this is who he still sees.  I relayed that both SS/NP and Dr Terrace Arabia are out of office today.  Maralyn Sago NP is here Tuesday.

## 2021-04-15 ENCOUNTER — Other Ambulatory Visit: Payer: Self-pay | Admitting: Neurology

## 2021-04-15 DIAGNOSIS — G35 Multiple sclerosis: Secondary | ICD-10-CM

## 2021-04-15 NOTE — Telephone Encounter (Signed)
I sent chat message to Lamont Snowball, RN for this pt. Relating that orders for ocrevus was placed by SS/NP.

## 2021-04-16 ENCOUNTER — Encounter (HOSPITAL_COMMUNITY): Payer: Medicaid Other

## 2021-04-17 ENCOUNTER — Non-Acute Institutional Stay (HOSPITAL_COMMUNITY)
Admission: RE | Admit: 2021-04-17 | Discharge: 2021-04-17 | Disposition: A | Discharge status: Home / Self Care | Payer: Medicaid Other | Source: Ambulatory Visit | Attending: Internal Medicine | Admitting: Internal Medicine

## 2021-04-17 ENCOUNTER — Other Ambulatory Visit: Payer: Self-pay

## 2021-04-17 DIAGNOSIS — G35 Multiple sclerosis: Secondary | ICD-10-CM | POA: Diagnosis present

## 2021-04-17 MED ORDER — DIPHENHYDRAMINE HCL 50 MG/ML IJ SOLN
25.0000 mg | Freq: Once | INTRAMUSCULAR | Status: AC
  Administered 2021-04-17: 10:00:00 25 mg via INTRAVENOUS
  Filled 2021-04-17: qty 1

## 2021-04-17 MED ORDER — METHYLPREDNISOLONE SODIUM SUCC 125 MG IJ SOLR
100.0000 mg | Freq: Once | INTRAMUSCULAR | Status: AC
  Administered 2021-04-17: 10:00:00 100 mg via INTRAVENOUS
  Filled 2021-04-17: qty 2

## 2021-04-17 MED ORDER — SODIUM CHLORIDE 0.9 % IV SOLN
600.0000 mg | Freq: Once | INTRAVENOUS | Status: AC
  Administered 2021-04-17: 11:00:00 600 mg via INTRAVENOUS
  Filled 2021-04-17: qty 20

## 2021-04-17 MED ORDER — ACETAMINOPHEN 325 MG PO TABS
650.0000 mg | ORAL_TABLET | Freq: Once | ORAL | Status: AC
  Administered 2021-04-17: 10:00:00 650 mg via ORAL
  Filled 2021-04-17: qty 2

## 2021-04-17 MED ORDER — SODIUM CHLORIDE 0.9 % IV SOLN: INTRAVENOUS | Status: DC | PRN

## 2021-04-17 NOTE — Progress Notes (Addendum)
PATIENT CARE CENTER NOTE     Diagnosis: Multiple sclerosis (HCC) (G35)      Provider: Margie Ege, NP     Procedure: Ocrevus 600 mg infusion     Note: Patient received Ocrevus infusion via PIV. Pre-medications given (Tylenol, IV Solumedrol, and IV Benadryl) per order. Infusion titrated per protocol. No adverse reaction noted. Vital signs stable. Patient declined the 1 hour observation post-infusion. Discharge instructions given. Patient alert, oriented and transferred in motorized chair at discharge.

## 2021-04-24 ENCOUNTER — Telehealth: Payer: Self-pay | Admitting: Neurology

## 2021-04-24 ENCOUNTER — Encounter: Payer: Self-pay | Admitting: *Deleted

## 2021-04-24 ENCOUNTER — Ambulatory Visit (INDEPENDENT_AMBULATORY_CARE_PROVIDER_SITE_OTHER): Payer: Medicaid Other | Admitting: Neurology

## 2021-04-24 ENCOUNTER — Encounter: Payer: Self-pay | Admitting: Neurology

## 2021-04-24 VITALS — BP 118/76 | HR 52 | Ht 73.0 in | Wt 205.0 lb

## 2021-04-24 DIAGNOSIS — G8250 Quadriplegia, unspecified: Secondary | ICD-10-CM

## 2021-04-24 DIAGNOSIS — G35 Multiple sclerosis: Secondary | ICD-10-CM | POA: Diagnosis not present

## 2021-04-24 DIAGNOSIS — R269 Unspecified abnormalities of gait and mobility: Secondary | ICD-10-CM

## 2021-04-24 MED ORDER — TIZANIDINE HCL 2 MG PO TABS: 2.0000 mg | ORAL_TABLET | Freq: Three times a day (TID) | ORAL | 1 refills | Status: DC | PRN

## 2021-04-24 NOTE — Addendum Note (Signed)
Addended by: Lindell Spar C on: 04/24/2021 12:22 PM   Modules accepted: Orders

## 2021-04-24 NOTE — Telephone Encounter (Signed)
I called [facility] at [phone] and spoke to [place], [place]. His medications have been confirmed and updated in Epic. He is not taking tizanidine.  Maralyn Sago has printed and signed a prescription to add tizanidine 2mg , one tablet q8h prn to his medication regimen. She also added the PT orders to her note.  The clinical notes w/ PT ordered and the tizanidine prescription have been faxed to Jackie's attention at [facility] (fax: [phone]). Confirmation received.

## 2021-04-24 NOTE — Telephone Encounter (Signed)
Can we get in contact with [facility]. I saw [name] today, he would benefit from PT, I think this has to be done through their office. I also wanted to clarify he was getting tizanidine that I added last visit for spasticity. His meds are packaged, he doesn't know what he takes. The last office visit from [facility] would be helpful as well. Thanks!

## 2021-04-24 NOTE — Patient Instructions (Signed)
I will get in touch with [facility] Check labs today  Continue Ocrevus See you back in 6 months

## 2021-04-24 NOTE — Progress Notes (Signed)
PATIENT: [name] DOB: July 19, 1965  REASON FOR VISIT: follow up HISTORY FROM: patient Primary Neurologist: Dr. Terrace Arabia since Dr .Anne Hahn has retired  HISTORY OF PRESENT ILLNESS: Today 04/24/21 [name] here today for follow-up with history of MS, on Ocrevus. Last infusion was 04/17/21. Uses electric wheelchair. [facility], sends meds in pill pack.  On baclofen and gabapentin. Added tizanidine to help with spasticity.  MRI of the brain Dec 2021 overall stable, moderate to severe MS. Right now having knee pain bilaterally when lifting legs. No falls. Living alone in apartment, hard to do the housework. Has an aide 2 hours 3 days a week, from [facility]. Isn't sure if taking tizanidine? When tries to use walker, his legs drag. He wants to do PT. He used to be to walk 1.5 blocks about 1.5 years ago. Can now just transfer to chair. Has walker at house. Urinary urgency, leakage. Was able to go to bathroom alone today in office.   Update 09/05/2020 SS: [name] is a 55 year old male with history of multiple sclerosis with a spastic quadriparesis.  He has gait disorder, mainly uses a wheelchair.  He remains on Ocrevus, next infusion 11/24.  He is on baclofen for spasticity of the legs, gabapentin for leg pain.  He lives in an independent living apartment, takes a bus to run errands.  Last MRI of the brain was done in November 2019, showing stability.  He completed physical therapy, with good benefit.  Is supposed to be getting a new electric wheelchair.  He has had 2 falls, lost his balance, getting back into his wheelchair.  He is able to do some slow walking with a walker "10 mph with w/c, 1 mph with walker".  No incontinence of bowels or bladder.  No new problems or concerns, left leg is chronically stiffer than right.  He has a girlfriend, who comes to visit.  He presents today for evaluation unaccompanied.  HISTORY 09/05/2019 SS: [name] is a 55 year old male with history of multiple  sclerosis with a spastic quadriparesis.  He has a gait disorder, mainly uses a wheelchair, but he does have a walker.  He remains on Ocrevus and is tolerating well, he missed his appointment by accident yesterday for infusion.  He remains on baclofen for spasticity of his legs, gabapentin for leg pain, these are beneficial.  Last MRI of the brain was done in November 2019, showing stability from 2 years prior.  Denies any new problems or exacerbating symptoms.  Does feel like he would benefit from some physical therapy to strengthen his legs.  He continues to live independently in an apartment, does his own daily activities, takes the bus to run errands, wants to keep it this way.  He has urinary urgency.  He is now wearing a heart monitor, due to low BP from his PCP he says.  He says he has not had a recent fall.  At home, he mostly uses a walker, he can walk somewhat independently, if he has something to brace against, such as a wall.  He presents today for evaluation unaccompanied.   REVIEW OF SYSTEMS: Out of a complete 14 system review of symptoms, the patient complains only of the following symptoms, and all other reviewed systems are negative.  See HPI  ALLERGIES: Allergies  Allergen Reactions   Morphine And Related Hives   Morphine And Related Hives    HOME MEDICATIONS: Outpatient Medications Prior to Visit  Medication Sig Dispense Refill  baclofen (LIORESAL) 20 MG tablet TAKE 1 TABLET (20 MG TOTAL) BY MOUTH 4 (FOUR) TIMES DAILY. 360 tablet 1   FLUoxetine (PROZAC) 40 MG capsule Take 40 mg by mouth every morning.     gabapentin (NEURONTIN) 300 MG capsule Take 1 capsule (300 mg total) by mouth 2 (two) times daily. 180 capsule 1   ibuprofen (ADVIL) 600 MG tablet Take 1 tablet (600 mg total) by mouth every 6 (six) hours as needed. 30 tablet 0   lisinopril-hydrochlorothiazide (PRINZIDE,ZESTORETIC) 20-12.5 MG tablet Take 1 tablet by mouth every morning.      montelukast (SINGULAIR) 10 MG  tablet Take 10 mg by mouth every morning.      tizanidine (ZANAFLEX) 2 MG capsule Take 1 capsule (2 mg total) by mouth 3 (three) times daily. 90 capsule 5   traZODone (DESYREL) 150 MG tablet Take 75 mg by mouth at bedtime.      acetaminophen (TYLENOL) 500 MG tablet Take 1,000 mg by mouth every 6 (six) hours as needed.     No facility-administered medications prior to visit.    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    PAST SURGICAL HISTORY: Past Surgical History:  Procedure Laterality Date   APPENDECTOMY     CLOSED REDUCTION NASAL FRACTURE  10-08-2004   and Septoplasty   HERNIA REPAIR     pt states has had hiatal hernia repair    HERNIA REPAIR     left ankle surgery      LUMBAR DISC SURGERY  11-02-2000   L4 -- L5   LUMBAR DISC SURGERY  2002   ORIF ANKLE FRACTURE Right 2008   ORIF HUMERUS FRACTURE Right 2008   ORIF RIGHT PROXIMAL HUMERUS FX AND ORIF RIGHT ANKLE FX  01-27-2007   RIGHT SHOULDER MANIPULATION WITH HARDWARE REMOVAL/  RIGHT TIBIA NONUNION SITE  HARDWARE REMOVAL AND EXCHANGED AND BONE GRAFT  AND ALLOGRAFT  05-10-2007   SEPTOPLASTY  2006   SHOULDER SURGERY Right 2009   TRANSURETHRAL RESECTION OF PROSTATE N/A 10/28/2015   Procedure: TRANSURETHRAL RESECTION OF THE PROSTATE WITH GYRUS INSTRUMENTS;  Surgeon: Malen Gauze, MD;  Location: [facility];  Service: Urology;  Laterality: N/A;   TRANSURETHRAL RESECTION OF PROSTATE  10/28/2015    FAMILY HISTORY: Family History  Problem Relation Age of Onset   Colon cancer Mother     SOCIAL HISTORY: Social History   Socioeconomic History   Marital status: Divorced    Spouse name: Not on file   Number of  children: 1   Years of education: 12   Highest education level: Not on file  Occupational History   Occupation: N/A  Tobacco Use   Smoking status: Every Day    Packs/day: 0.50    Years: 30.00    Pack years: 15.00    Types: Cigarettes   Smokeless tobacco: Never  Substance and Sexual Activity   Alcohol use: Not Currently    Comment: Drinks beer occasionally   Drug use: Not Currently    Types: Cocaine, Marijuana    Comment:  cocaine use--  per pt last cocaine Feb 2017   Sexual activity: Not on file  Other Topics Concern  Not on file  Social History Narrative   ** Merged History Encounter **       Lives alone Right-handed Caffeine: 2 cups of coffee and 20 oz soda/day   Social Determinants of Health   Financial Resource Strain: Not on file  Food Insecurity: Not on file  Transportation Needs: Not on file  Physical Activity: Not on file  Stress: Not on file  Social Connections: Not on file  Intimate Partner Violence: Not on file   PHYSICAL EXAM  Vitals:   04/24/21 1104  BP: 118/76  Pulse: (!) 52  Weight: 205 lb (93 kg)  Height: 6\' 1"  (1.854 m)    Body mass index is 27.05 kg/m.  Generalized: Well developed, in no acute distress   Neurological examination  Mentation: Alert oriented to time, place, history taking. Follows all commands speech and language fluent Cranial nerve II-XII: Pupils were equal round reactive to light. Extraocular movements were full, visual field were full on confrontational test. Facial sensation and strength were normal. Head turning and shoulder shrug  were normal and symmetric. Motor: Good strength of upper extremities, lower extremities, 3/5 right hip flexion, 3/5 left hip flexion, increased tone in lower extremities Sensory: Sensory testing is intact to soft touch on all 4 extremities. No evidence of extinction is noted.  Coordination: Cerebellar testing reveals good finger-nose-finger bilaterally, difficulty with lower extremities Gait  and station: walking was not attempted, in wheelchair Reflexes: Deep tendon reflexes are symmetric but somewhat increased throughout  DIAGNOSTIC DATA (LABS, IMAGING, TESTING) - I reviewed patient records, labs, notes, testing and imaging myself where available.  Lab Results  Component Value Date   WBC 5.7 09/05/2020   HGB 13.1 09/05/2020   HCT 39.9 09/05/2020   MCV 92 09/05/2020   PLT 293 09/05/2020      Component Value Date/Time   NA 143 09/05/2020 0938   K 4.6 09/05/2020 0938   CL 104 09/05/2020 0938   CO2 24 09/05/2020 0938   GLUCOSE 91 09/05/2020 0938   GLUCOSE 133 (H) 08/20/2017 1204   BUN 20 09/05/2020 0938   CREATININE 1.86 (H) 09/05/2020 0938   CALCIUM 10.5 (H) 09/05/2020 0938   PROT 8.0 09/05/2020 0938   ALBUMIN 5.1 (H) 09/05/2020 0938   AST 17 09/05/2020 0938   ALT 11 09/05/2020 0938   ALKPHOS 72 09/05/2020 0938   BILITOT 0.3 09/05/2020 0938   GFRNONAA 67 03/07/2020 1413   GFRAA 77 03/07/2020 1413   No results found for: CHOL, HDL, LDLCALC, LDLDIRECT, TRIG, CHOLHDL No results found for: 13/02/2020 Lab Results  Component Value Date   VITAMINB12 381 03/16/2016   No results found for: TSH  ASSESSMENT AND PLAN 55 y.o. 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 here with:  1.  Multiple sclerosis, primary progressive 2.  Spastic quadriparesis 3.  Gait disorder  -Margie is overall stable in regards to his MS -I will reach out to [facility], I think he could benefit from a course of of physical therapy to work on his gait, balance, and leg strength; done through [facility] or neuro rehab? -Need to verify medications, ensure he is taking tizanidine, (added last visit) if so, could consider an  increase, increased spasticity of lower extremities; continue baclofen and gabapentin -Continue Ocrevus every 6 months, infused 04/17/21 -Check  routine blood work today on [place], in May IgG IgA, IgM were within normal range -MRI of the brain in December 2021 was overall stable, showed moderate to severe chronic demyelinating disease -Continue baclofen and gabapentin -Follow-up in 6 months or sooner if needed, he will now be followed by Dr. Terrace Arabia since Dr. Anne Hahn has retired  Margie Ege, AGNP-C, DNP 04/24/2021, 11:12 AM [facility] [address] [place], [place] [address] [phone]

## 2021-04-25 LAB — COMPREHENSIVE METABOLIC PANEL
ALT: 16 IU/L (ref 0–44)
AST: 16 IU/L (ref 0–40)
Albumin/Globulin Ratio: 2.2 (ref 1.2–2.2)
Albumin: 4.9 g/dL (ref 3.8–4.9)
Alkaline Phosphatase: 54 IU/L (ref 44–121)
BUN/Creatinine Ratio: 12 (ref 9–20)
BUN: 18 mg/dL (ref 6–24)
Bilirubin Total: 0.2 mg/dL (ref 0.0–1.2)
CO2: 22 mmol/L (ref 20–29)
Calcium: 10.2 mg/dL (ref 8.7–10.2)
Chloride: 107 mmol/L — ABNORMAL HIGH (ref 96–106)
Creatinine, Ser: 1.46 mg/dL — ABNORMAL HIGH (ref 0.76–1.27)
Globulin, Total: 2.2 g/dL (ref 1.5–4.5)
Glucose: 88 mg/dL (ref 70–99)
Potassium: 4.2 mmol/L (ref 3.5–5.2)
Sodium: 142 mmol/L (ref 134–144)
Total Protein: 7.1 g/dL (ref 6.0–8.5)
eGFR: 56 mL/min/{1.73_m2} — ABNORMAL LOW (ref >59)

## 2021-04-25 LAB — CBC WITH DIFFERENTIAL/PLATELET
Basophils Absolute: 0.1 10*3/uL (ref 0.0–0.2)
Basos: 1 %
EOS (ABSOLUTE): 0.1 10*3/uL (ref 0.0–0.4)
Eos: 3 %
Hematocrit: 37.5 % (ref 37.5–51.0)
Hemoglobin: 12.7 g/dL — ABNORMAL LOW (ref 13.0–17.7)
Immature Grans (Abs): 0 10*3/uL (ref 0.0–0.1)
Immature Granulocytes: 0 %
Lymphocytes Absolute: 2 10*3/uL (ref 0.7–3.1)
Lymphs: 46 %
MCH: 30.8 pg (ref 26.6–33.0)
MCHC: 33.9 g/dL (ref 31.5–35.7)
MCV: 91 fL (ref 79–97)
Monocytes Absolute: 0.5 10*3/uL (ref 0.1–0.9)
Monocytes: 11 %
Neutrophils Absolute: 1.7 10*3/uL (ref 1.4–7.0)
Neutrophils: 39 %
Platelets: 251 10*3/uL (ref 150–450)
RBC: 4.12 x10E6/uL — ABNORMAL LOW (ref 4.14–5.80)
RDW: 12.9 % (ref 11.6–15.4)
WBC: 4.3 10*3/uL (ref 3.4–10.8)

## 2021-04-25 LAB — IGG, IGA, IGM
IgA/Immunoglobulin A, Serum: 178 mg/dL (ref 90–386)
IgG (Immunoglobin G), Serum: 1086 mg/dL (ref 603–1613)
IgM (Immunoglobulin M), Srm: 48 mg/dL (ref 20–172)

## 2021-04-29 NOTE — Telephone Encounter (Signed)
His recent lab work has also been faxed to [facility].  From Margie Ege, NP:  Please call the patient and send over to [facility]. Labs show no significant abnormalities. Continue Ocrevus every 6 month intervals.

## 2021-04-29 NOTE — Progress Notes (Signed)
Chart reviewed, agree above plan ?

## 2021-04-29 NOTE — Telephone Encounter (Signed)
Also, left message for patient (no significant abnormalities and to continue medication). Provided our number to call back with any questions.

## 2021-06-06 ENCOUNTER — Inpatient Hospital Stay (HOSPITAL_COMMUNITY)
Admission: EM | Admit: 2021-06-06 | Discharge: 2021-06-10 | DRG: 640 | Disposition: A | Discharge status: Skilled Nursing Facility | Payer: Medicaid Other | Attending: Internal Medicine | Admitting: Internal Medicine

## 2021-06-06 ENCOUNTER — Other Ambulatory Visit: Payer: Self-pay

## 2021-06-06 ENCOUNTER — Emergency Department (HOSPITAL_COMMUNITY): Payer: Medicaid Other

## 2021-06-06 ENCOUNTER — Observation Stay (HOSPITAL_COMMUNITY): Payer: Medicaid Other

## 2021-06-06 DIAGNOSIS — Z885 Allergy status to narcotic agent status: Secondary | ICD-10-CM

## 2021-06-06 DIAGNOSIS — Z20822 Contact with and (suspected) exposure to covid-19: Secondary | ICD-10-CM | POA: Diagnosis present

## 2021-06-06 DIAGNOSIS — N179 Acute kidney failure, unspecified: Secondary | ICD-10-CM | POA: Diagnosis not present

## 2021-06-06 DIAGNOSIS — E860 Dehydration: Principal | ICD-10-CM | POA: Diagnosis present

## 2021-06-06 DIAGNOSIS — I10 Essential (primary) hypertension: Secondary | ICD-10-CM | POA: Diagnosis present

## 2021-06-06 DIAGNOSIS — R4182 Altered mental status, unspecified: Secondary | ICD-10-CM

## 2021-06-06 DIAGNOSIS — G8389 Other specified paralytic syndromes: Secondary | ICD-10-CM | POA: Diagnosis present

## 2021-06-06 DIAGNOSIS — F32A Depression, unspecified: Secondary | ICD-10-CM | POA: Diagnosis present

## 2021-06-06 DIAGNOSIS — N390 Urinary tract infection, site not specified: Secondary | ICD-10-CM

## 2021-06-06 DIAGNOSIS — E785 Hyperlipidemia, unspecified: Secondary | ICD-10-CM | POA: Diagnosis present

## 2021-06-06 DIAGNOSIS — T464X5A Adverse effect of angiotensin-converting-enzyme inhibitors, initial encounter: Secondary | ICD-10-CM | POA: Diagnosis present

## 2021-06-06 DIAGNOSIS — T481X5A Adverse effect of skeletal muscle relaxants [neuromuscular blocking agents], initial encounter: Secondary | ICD-10-CM | POA: Diagnosis present

## 2021-06-06 DIAGNOSIS — T428X5A Adverse effect of antiparkinsonism drugs and other central muscle-tone depressants, initial encounter: Secondary | ICD-10-CM | POA: Diagnosis present

## 2021-06-06 DIAGNOSIS — G928 Other toxic encephalopathy: Secondary | ICD-10-CM

## 2021-06-06 DIAGNOSIS — T502X5A Adverse effect of carbonic-anhydrase inhibitors, benzothiadiazides and other diuretics, initial encounter: Secondary | ICD-10-CM | POA: Diagnosis present

## 2021-06-06 DIAGNOSIS — G35 Multiple sclerosis: Secondary | ICD-10-CM | POA: Diagnosis present

## 2021-06-06 DIAGNOSIS — N400 Enlarged prostate without lower urinary tract symptoms: Secondary | ICD-10-CM | POA: Diagnosis present

## 2021-06-06 DIAGNOSIS — T426X5A Adverse effect of other antiepileptic and sedative-hypnotic drugs, initial encounter: Secondary | ICD-10-CM | POA: Diagnosis present

## 2021-06-06 LAB — URINALYSIS, ROUTINE W REFLEX MICROSCOPIC
Bilirubin Urine: NEGATIVE
Glucose, UA: NEGATIVE mg/dL
Ketones, ur: NEGATIVE mg/dL
Nitrite: NEGATIVE
Protein, ur: 30 mg/dL — AB
Specific Gravity, Urine: 1.018 (ref 1.005–1.030)
pH: 5 (ref 5.0–8.0)

## 2021-06-06 LAB — CBC WITH DIFFERENTIAL/PLATELET
Abs Immature Granulocytes: 0.02 10*3/uL (ref 0.00–0.07)
Basophils Absolute: 0.1 10*3/uL (ref 0.0–0.1)
Basophils Relative: 1 %
Eosinophils Absolute: 0 10*3/uL (ref 0.0–0.5)
Eosinophils Relative: 0 %
HCT: 41.5 % (ref 39.0–52.0)
Hemoglobin: 14 g/dL (ref 13.0–17.0)
Immature Granulocytes: 0 %
Lymphocytes Relative: 22 %
Lymphs Abs: 1.6 10*3/uL (ref 0.7–4.0)
MCH: 31.5 pg (ref 26.0–34.0)
MCHC: 33.7 g/dL (ref 30.0–36.0)
MCV: 93.3 fL (ref 80.0–100.0)
Monocytes Absolute: 0.6 10*3/uL (ref 0.1–1.0)
Monocytes Relative: 9 %
Neutro Abs: 4.9 10*3/uL (ref 1.7–7.7)
Neutrophils Relative %: 68 %
Platelets: 263 10*3/uL (ref 150–400)
RBC: 4.45 MIL/uL (ref 4.22–5.81)
RDW: 12.5 % (ref 11.5–15.5)
WBC: 7.2 10*3/uL (ref 4.0–10.5)
nRBC: 0 % (ref 0.0–0.2)

## 2021-06-06 LAB — COMPREHENSIVE METABOLIC PANEL
ALT: 19 U/L (ref 0–44)
AST: 37 U/L (ref 15–41)
Albumin: 4.4 g/dL (ref 3.5–5.0)
Alkaline Phosphatase: 39 U/L (ref 38–126)
Anion gap: 11 (ref 5–15)
BUN: 56 mg/dL — ABNORMAL HIGH (ref 6–20)
CO2: 23 mmol/L (ref 22–32)
Calcium: 10 mg/dL (ref 8.9–10.3)
Chloride: 106 mmol/L (ref 98–111)
Creatinine, Ser: 4.03 mg/dL — ABNORMAL HIGH (ref 0.61–1.24)
GFR, Estimated: 16 mL/min — ABNORMAL LOW (ref >60)
Glucose, Bld: 103 mg/dL — ABNORMAL HIGH (ref 70–99)
Potassium: 4.2 mmol/L (ref 3.5–5.1)
Sodium: 140 mmol/L (ref 135–145)
Total Bilirubin: 0.8 mg/dL (ref 0.3–1.2)
Total Protein: 7.4 g/dL (ref 6.5–8.1)

## 2021-06-06 LAB — LACTIC ACID, PLASMA
Lactic Acid, Venous: 1.6 mmol/L (ref 0.5–1.9)
Lactic Acid, Venous: 2.2 mmol/L (ref 0.5–1.9)
Lactic Acid, Venous: 2.3 mmol/L (ref 0.5–1.9)

## 2021-06-06 LAB — SODIUM, URINE, RANDOM: Sodium, Ur: 120 mmol/L

## 2021-06-06 LAB — AMMONIA: Ammonia: 27 umol/L (ref 9–35)

## 2021-06-06 LAB — RESP PANEL BY RT-PCR (FLU A&B, COVID) ARPGX2
Influenza A by PCR: NEGATIVE
Influenza B by PCR: NEGATIVE
SARS Coronavirus 2 by RT PCR: NEGATIVE

## 2021-06-06 LAB — PROTIME-INR
INR: 1.1 (ref 0.8–1.2)
Prothrombin Time: 14.4 seconds (ref 11.4–15.2)

## 2021-06-06 LAB — I-STAT CHEM 8, ED
BUN: 75 mg/dL — ABNORMAL HIGH (ref 6–20)
Calcium, Ion: 1.21 mmol/L (ref 1.15–1.40)
Chloride: 111 mmol/L (ref 98–111)
Creatinine, Ser: 4 mg/dL — ABNORMAL HIGH (ref 0.61–1.24)
Glucose, Bld: 99 mg/dL (ref 70–99)
HCT: 39 % (ref 39.0–52.0)
Hemoglobin: 13.3 g/dL (ref 13.0–17.0)
Potassium: 5.3 mmol/L — ABNORMAL HIGH (ref 3.5–5.1)
Sodium: 141 mmol/L (ref 135–145)
TCO2: 25 mmol/L (ref 22–32)

## 2021-06-06 LAB — ACETAMINOPHEN LEVEL: Acetaminophen (Tylenol), Serum: <10 ug/mL — ABNORMAL LOW (ref 10–30)

## 2021-06-06 LAB — RAPID URINE DRUG SCREEN, HOSP PERFORMED
Amphetamines: NOT DETECTED
Barbiturates: NOT DETECTED
Benzodiazepines: NOT DETECTED
Cocaine: NOT DETECTED
Opiates: NOT DETECTED
Tetrahydrocannabinol: POSITIVE — AB

## 2021-06-06 LAB — CREATININE, URINE, RANDOM: Creatinine, Urine: 207.31 mg/dL

## 2021-06-06 LAB — CK: Total CK: 2624 U/L — ABNORMAL HIGH (ref 49–397)

## 2021-06-06 LAB — ETHANOL: Alcohol, Ethyl (B): <10 mg/dL (ref <10)

## 2021-06-06 LAB — SALICYLATE LEVEL: Salicylate Lvl: <7 mg/dL — ABNORMAL LOW (ref 7.0–30.0)

## 2021-06-06 MED ORDER — LACTATED RINGERS IV SOLN: INTRAVENOUS | Status: AC

## 2021-06-06 MED ORDER — SODIUM CHLORIDE 0.9 % IV BOLUS
1000.0000 mL | Freq: Once | INTRAVENOUS | Status: AC
  Administered 2021-06-06: 1000 mL via INTRAVENOUS

## 2021-06-06 MED ORDER — AMLODIPINE BESYLATE 5 MG PO TABS
5.0000 mg | ORAL_TABLET | Freq: Every day | ORAL | Status: DC
  Administered 2021-06-06 – 2021-06-07 (×2): 5 mg via ORAL
  Filled 2021-06-06 (×2): qty 1

## 2021-06-06 MED ORDER — ADULT MULTIVITAMIN W/MINERALS CH
1.0000 | ORAL_TABLET | Freq: Every day | ORAL | Status: DC
  Administered 2021-06-06 – 2021-06-10 (×5): 1 via ORAL
  Filled 2021-06-06 (×5): qty 1

## 2021-06-06 MED ORDER — ACETAMINOPHEN 650 MG RE SUPP: 650.0000 mg | Freq: Four times a day (QID) | RECTAL | Status: DC | PRN

## 2021-06-06 MED ORDER — SODIUM CHLORIDE 0.9 % IV BOLUS
500.0000 mL | Freq: Once | INTRAVENOUS | Status: AC
  Administered 2021-06-06: 500 mL via INTRAVENOUS

## 2021-06-06 MED ORDER — ACETAMINOPHEN 325 MG PO TABS: 650.0000 mg | ORAL_TABLET | Freq: Four times a day (QID) | ORAL | Status: DC | PRN

## 2021-06-06 MED ORDER — CEFTRIAXONE SODIUM 1 G IJ SOLR
1.0000 g | Freq: Once | INTRAMUSCULAR | Status: AC
  Administered 2021-06-06: 1 g via INTRAVENOUS
  Filled 2021-06-06: qty 10

## 2021-06-06 MED ORDER — SODIUM CHLORIDE 0.9 % IV SOLN: Freq: Once | INTRAVENOUS | Status: AC

## 2021-06-06 MED ORDER — FOLIC ACID 1 MG PO TABS
1.0000 mg | ORAL_TABLET | Freq: Every day | ORAL | Status: DC
  Administered 2021-06-06 – 2021-06-10 (×5): 1 mg via ORAL
  Filled 2021-06-06 (×5): qty 1

## 2021-06-06 MED ORDER — ENOXAPARIN SODIUM 30 MG/0.3ML IJ SOSY
30.0000 mg | PREFILLED_SYRINGE | INTRAMUSCULAR | Status: DC
  Administered 2021-06-06: 30 mg via SUBCUTANEOUS
  Filled 2021-06-06: qty 0.3

## 2021-06-06 MED ORDER — LACTATED RINGERS IV BOLUS: 500.0000 mL | Freq: Once | INTRAVENOUS | Status: DC

## 2021-06-06 NOTE — H&P (Addendum)
Date: 06/06/2021               Patient Name:  [name] MRN: [id number]  DOB: Jan 17, 1966 Age / Sex: 56 y.o., male   PCP[facility]         Medical Service: Internal Medicine Teaching Service         Attending Physician: Dr. Lottie Mussel, MD    First Contact: Delene Ruffini, MD Pager: Lysbeth Penner [phone]  Second Contact: Rick Duff MD Pager: [phone]       After Hours (After 5p/  First Contact Pager: [phone]  weekends / holidays): Second Contact Pager: [phone]   SUBJECTIVE   Chief Complaint: AMS  History of Present Illness:  Patient is a 32 yom with a hx of MS who presents to the emergency department via EMS with altered mental status. History limited as patient is confused, oriented only to person and place, and not able to answer questions reliably. Tangential thinking. ?hallucinations as patient stated he saw his phone on table when phone was noted to be in personal bag.  Per ED, friends were checking in on patient earlier today and noticed that he appeared more confused. Patient told friends that he took more Trazodone than he usually does. Patient states he took Trazodone two days ago.  EMS was called and he was brought into the ED for further evaluation.  Unable to contact friends for collateral information. Did discuss with contact Freda Munro who states that she saw him approximately 1 month ago at which time he was able to carry on normal conversation. He is not confused at baseline. He does have spastic paralysis at baseline and normally has difficulty transferring/moving legs. He does answer some simple yes/no questioning. Denies pain. No fever, nausea, vomiting, diarrhea.  ED Course: Upon arrival in the ED, patient was noted to disheveled and covered with urine and feces. CT head negative.  Labs were significant for creatinine 4.0, BUN 56, K 4.2, and CK 2624. UA was suggestive possible UTI. Blood and urine cultures collected. Medicine was  consulted for further management.   Meds:  No outpatient medications have been marked as taking for the 06/06/21 encounter [facility] Encounter).    Past Medical History:  Diagnosis Date   Abnormality of gait 11/28/2015   BPH (benign prostatic hyperplasia)    Depression    Environmental and seasonal allergies    History of hiatal hernia    Hypertension    Multiple sclerosis ([place])    Multiple sclerosis, primary progressive ([place]) 05/19/2016   Spastic quadriparesis ([place]) 11/28/2015    Past Surgical History:  Procedure Laterality Date   APPENDECTOMY     CLOSED REDUCTION NASAL FRACTURE  10-08-2004   and Septoplasty   HERNIA REPAIR     pt states has had hiatal hernia repair    HERNIA REPAIR     left ankle surgery      LUMBAR DISC SURGERY  11-02-2000   L4 -- L5   LUMBAR DISC SURGERY  2002   ORIF ANKLE FRACTURE Right 2008   ORIF HUMERUS FRACTURE Right 2008   ORIF RIGHT PROXIMAL HUMERUS FX AND ORIF RIGHT ANKLE FX  01-27-2007   RIGHT SHOULDER MANIPULATION WITH HARDWARE REMOVAL/  RIGHT TIBIA NONUNION SITE  HARDWARE REMOVAL AND EXCHANGED AND BONE GRAFT  AND ALLOGRAFT  05-10-2007   SEPTOPLASTY  2006   SHOULDER SURGERY Right 2009   TRANSURETHRAL RESECTION OF PROSTATE N/A 10/28/2015   Procedure: TRANSURETHRAL RESECTION OF THE PROSTATE  WITH GYRUS INSTRUMENTS;  Surgeon: Cleon Gustin, MD;  Location: [facility];  Service: Urology;  Laterality: N/A;   TRANSURETHRAL RESECTION OF PROSTATE  10/28/2015    Social:  Lives With: none Occupation: none  Support: friends, hh nurse Level of Function: paraplegic PCP: [facility] Substances: alcohol, tobacco  Family History: none  Allergies: Allergies as of 06/06/2021 - Review Complete 06/06/2021  Allergen Reaction Noted   Morphine and related Hives 04/18/2015   Morphine and related Hives 08/12/2017    Review of Systems: A complete ROS was negative except as per HPI.   OBJECTIVE:   Physical Exam: Blood pressure (!) 179/77, pulse 78, temperature  99.1 F (37.3 C), temperature source Rectal, resp. rate 19, height 6\' 2"  (1.88 m), weight 77.1 kg, SpO2 93 %.  Constitutional: chronically ill appearing male, in no acute distress HENT: normocephalic atraumatic, mucous membranes moist Eyes: conjunctiva non-erythematous Cardiovascular: regular rate and rhythm, no m/r/g Pulmonary/Chest: normal work of breathing on room air, lungs clear to auscultation bilaterally Abdominal: soft, non-tender, non-distended MSK: decreased bulk and tone bilateral lower extremities Neurological: alert, oriented to person and place only. Does not follow commands properly. PEERL, no facial droop noted. Strength and sensation intact bilateral upper extremities. Dysdiadochokinesia and dysmetria bilaterally. Sensation intact bilateral lower, strength 2/5 bilaterally Skin: warm and dry, poor turgor Psych: unable to assess  Labs: CBC    Component Value Date/Time   WBC 7.2 06/06/2021 1400   RBC 4.45 06/06/2021 1400   HGB 13.3 06/06/2021 1416   HGB 12.7 (L) 04/24/2021 1356   HCT 39.0 06/06/2021 1416   HCT 37.5 04/24/2021 1356   PLT 263 06/06/2021 1400   PLT 251 04/24/2021 1356   MCV 93.3 06/06/2021 1400   MCV 91 04/24/2021 1356   MCH 31.5 06/06/2021 1400   MCHC 33.7 06/06/2021 1400   RDW 12.5 06/06/2021 1400   RDW 12.9 04/24/2021 1356   LYMPHSABS 1.6 06/06/2021 1400   LYMPHSABS 2.0 04/24/2021 1356   MONOABS 0.6 06/06/2021 1400   EOSABS 0.0 06/06/2021 1400   EOSABS 0.1 04/24/2021 1356   BASOSABS 0.1 06/06/2021 1400   BASOSABS 0.1 04/24/2021 1356     CMP     Component Value Date/Time   NA 141 06/06/2021 1416   NA 142 04/24/2021 1356   K 5.3 (H) 06/06/2021 1416   CL 111 06/06/2021 1416   CO2 23 06/06/2021 1400   GLUCOSE 99 06/06/2021 1416   BUN 75 (H) 06/06/2021 1416   BUN 18 04/24/2021 1356   CREATININE 4.00 (H) 06/06/2021 1416   CALCIUM 10.0 06/06/2021 1400   PROT 7.4 06/06/2021 1400   PROT 7.1 04/24/2021 1356   ALBUMIN 4.4 06/06/2021 1400    ALBUMIN 4.9 04/24/2021 1356   AST 37 06/06/2021 1400   ALT 19 06/06/2021 1400   ALKPHOS 39 06/06/2021 1400   BILITOT 0.8 06/06/2021 1400   BILITOT 0.2 04/24/2021 1356   GFRNONAA 16 (L) 06/06/2021 1400   GFRAA 77 03/07/2020 1413    Imaging: CT Head Wo Contrast  Result Date: 06/06/2021 CLINICAL DATA:  Mental status change, unknown cause EXAM: CT HEAD WITHOUT CONTRAST TECHNIQUE: Contiguous axial images were obtained from the base of the skull through the vertex without intravenous contrast. RADIATION DOSE REDUCTION: This exam was performed according to the departmental dose-optimization program which includes automated exposure control, adjustment of the mA and/or kV according to patient size and/or use of iterative reconstruction technique. COMPARISON:  Brain MRI 04/02/2020 FINDINGS: Brain: No evidence of acute intracranial hemorrhage  or extra-axial collection.No evidence of mass lesion/concern mass effect.The ventricles are normal in size.Confluent periventricular and subcortical white matter hypoattenuation, which is nonspecific but likely sequela of chronic small vessel ischemic disease.Mild cerebral atrophy Vascular: No hyperdense vessel or unexpected calcification. Skull: Normal. Negative for fracture or focal lesion. Sinuses/Orbits: Mucous retention cyst in left maxillary sinus. Other: None. IMPRESSION: No acute intracranial abnormality. Chronic small vessel ischemic disease. Electronically Signed   By: Maurine Simmering M.D.   On: 06/06/2021 15:07   US RENAL  Result Date: 06/06/2021 CLINICAL DATA:  [place] eye. EXAM: RENAL / URINARY TRACT ULTRASOUND COMPLETE COMPARISON:  None FINDINGS: Right Kidney: Renal measurements: 11.6 x 4.3 x 4.9 cm = volume: 126 mL. Echogenicity increased. No mass or hydronephrosis visualized. Left Kidney: Renal measurements: 10.1 x 6.8 x 5.1 cm = volume: 183 mL. Echogenicity increased. No mass or hydronephrosis visualized. Urinary bladder: Not visualized.  Per ultrasound  technologist, Foley catheter. Other: None. IMPRESSION: Increased renal echogenicity bilaterally suggestive of renal parenchymal disease. Electronically Signed   By: Iven Finn M.D.   On: 06/06/2021 17:34   DG Chest Port 1 View  Result Date: 06/06/2021 CLINICAL DATA:  Altered mental status EXAM: PORTABLE CHEST 1 VIEW COMPARISON:  Previous chest radiographs done on 08/12/2017 and CT done on 05/02/2020 FINDINGS: There is marked elevation of left hemidiaphragm. Cardiac size is within normal limits. There are no signs of pulmonary edema or new focal infiltrates. There is minimal blunting of left lateral CP angle. There is no pneumothorax. There is previous internal fixation in the right humerus. IMPRESSION: There are no signs of pulmonary edema or new focal infiltrates. Electronically Signed   By: Elmer Picker M.D.   On: 06/06/2021 13:53    EKG: personally reviewed my interpretation is   ASSESSMENT & PLAN:    Assessment & Plan by Problem: Principal Problem:   AMS (altered mental status)   #altered mental status - Not drowsy or lethargic, but is confused. Alert and oriented to person and place but not time. At baseline he is AAOX3. He has MS with spastic paralysis so baseline difficulty with movement. - Etiology remains unclear at this time. Pt reported taking more trazodone than normal recently, but states that it was 2 days ago. Discussed with poison control who felt that trazodone likely not cause. Acetaminophen, salicylate, and etoh level negative.  - No elevation in ammonia level to suggest hepatic encephalopathy. - Uremia may also be potential cause as he has significantly elevated Cr and BUN 75. Will monitor for improvement with IVFs.  - Urine noted to have leukocytes and bacteria. UTI may be causing symptoms, however, he has been afebrile and no elevation in his WBC. He has received one dose of CTX.  - Lastly, he has MS which can cause seizure, however, he is awake and does not  appear to be post-ictal. - F/u UDS - Bcx and Ucx -,if no improvement with current management, can consider head MRI - continue to monitor with daily CBC and CMP.  # AKI ? UTI Hx BPH Cr 4 and BUN 75, baseline Cr appears to be around 1.5 with normal BUN. Patient appears dehydrated on exam. Elevated CK 2600.  - Give fluids and trend ck for improvement. If no improvement in fluids, can consider [place] to further evaluate AKI - fu lactic - monitor electrolytes - f/u Ucx, if positive can start abx - urine studies  ? UTI - UA with bacteria and leukocytes. Given one dose CTX. He denies urinary  symptoms. If Ucx positive, can start abx.  HTN Home meds lisinopril hctz - started amlodipine 5mg   #MS Spastic paresis - uses electric wheel chair, spastic paresis - Follows with neurology. On ocrevus - baclofen and gabapentin  HLD Lipitor 40mg   Depression - fluoxetine 40  Code: Full Prior to Admission Living Arrangement: Home, living self   Dispo: Admit patient to Inpatient with expected length of stay greater than 2 midnights.  Signed: Delene Ruffini, MD Internal Medicine Resident PGY-1 Pager: [phone]  06/06/2021, 7:47 PM    Internal Medicine Attending:   I saw and examined the patient. I reviewed the residents note and I agree with the residents findings and plan as documented in the residents note.   56yo man with MS who presented with altered mental status, found to have acute kidney injury.  The etiology of his AMS isn't 100% clear, but I think it's consistent with polypharmacy in the setting of poor clearance from his AKI. No structural abnormalities seen on CT head. His U/A showed possible UTI, but I confirmed with him today that he's asymptomatic, so I don't think infection is driving this process. Will continue to hold baclofen & will talk to Neuro about when to restart  His AKI is improving with IVF, consistent with a pre-renal etiology. Holding ACE & HCTZ.  Continue IVF

## 2021-06-06 NOTE — ED Triage Notes (Signed)
PT BIB by [facility] from home for AMS x2. Pt's family reports he has had increase in confusion and lethargy, and possibly overdosed on trazodone yesterday? But no EMS call for that incident. Family checked on pt today and noted he was still in the same position, same clothes and covered in urine so called EMS.   CBG 132 146/100 HR80 99% on RA

## 2021-06-06 NOTE — ED Notes (Signed)
Patient transported to CT 

## 2021-06-06 NOTE — Hospital Course (Addendum)
Ms, pace, confusion lives by self Confused noticed by firneds taking extra trazosone Ems called today. Looked dry Early skin breakdown right hip Been I nwheelchair Covered in urine and feces Ck 26-24 Bun 54 with cr 4 Urine looks dirty with bacteria Fluis and rocephin Covid and flu negative. Cxr negative -baseline mentation now  Alert to place, person, knows month but not year,   Took too much of hsi trazodone.   Larey Seat out on the second floor or ninth floor. And his friends were worried about him.   No pain.   Coughing up some phlegm.   No pain with peeing. No polyuria.   Belly pain yeasterday. Symptoms for two days. No nausea or vomiting. No new weakness. Has history of MS. Takes trazodone but does nto recall the rest of his medications.   Today is day 3.    Dificult to redirect. Intermittently appropriately responds. Unclear of his medications.   [facility]. Lives at home. Reggie and Ms [facility] check on patient daily.    PMHL:   Manic depression and MS  Medications:  Unclear of his medications  PSH:  None  Hallucinating some per patient earlier.   SH: drinks two sips of candaian mist  Smokes  No cocaine or other drugs   Lives by himself, aide comes in MWF  Able to cook sometiems, has a wheelchair, has to take his medicine by himself. Takes his medicine by himself.   Unable to lift his legs.

## 2021-06-06 NOTE — Progress Notes (Signed)
Patient is not available at this moment, per nurse. Tech will try back later.

## 2021-06-06 NOTE — Progress Notes (Signed)
EEG complete - results pending 

## 2021-06-06 NOTE — ED Notes (Signed)
Pt has abrasion to R hip on arrival, appears that he laid in wet clothes and skin was blistered, then blister covering was torn off when pants were removed.

## 2021-06-06 NOTE — ED Notes (Signed)
Patient returned back from CT. 

## 2021-06-06 NOTE — ED Provider Notes (Signed)
[facility] EMERGENCY DEPARTMENT Provider Note   CSN: [id number] Arrival date & time: 06/06/21  1319     History  Chief Complaint  Patient presents with   Altered Mental Status    [name] is a 56 y.o. male.  56 year old male with prior medical history as detailed below presents for evaluation.  Patient arrives by EMS.  Patient reported lives at home.  Patient's friend contacted EMS after the patient appeared to be confused and more lethargic than normal.  Patient's friends were concerned that he had perhaps overdosed on trazodone yesterday.  Patient is alert upon arrival.  He is confused.  He is disheveled and covered with urine and feces.  Per EMS, the patient was found sitting in his wheelchair.  It is unclear when he last left the wheelchair.  The history is provided by the patient and medical records.  Altered Mental Status Presenting symptoms: behavior changes and confusion   Severity:  Moderate Most recent episode:  Yesterday Episode history:  Unable to specify Timing:  Unable to specify Progression:  Unable to specify     Home Medications Prior to Admission medications   Medication Sig Start Date End Date Taking? Authorizing Provider  acetaminophen (TYLENOL) 325 MG tablet Take 650 mg by mouth every 8 (eight) hours as needed.    [provider]  aspirin EC 81 MG tablet Take 81 mg by mouth daily.    [provider]  atorvastatin (LIPITOR) 40 MG tablet Take 40 mg by mouth daily.    [provider]  baclofen (LIORESAL) 20 MG tablet TAKE 1 TABLET (20 MG TOTAL) BY MOUTH 4 (FOUR) TIMES DAILY. 02/26/20   Suzzanne Cloud, NP  Cholecalciferol (VITAMIN D3 PO) Take 25 mcg by mouth daily.    [provider]  Docusate Calcium (STOOL SOFTENER PO) Take 1 tablet by mouth as needed.    [provider]  FLUoxetine (PROZAC) 40 MG capsule Take 40 mg by mouth every morning.    [provider]  gabapentin  (NEURONTIN) 300 MG capsule Take 1 capsule (300 mg total) by mouth 2 (two) times daily. 03/07/20   Suzzanne Cloud, NP  latanoprost (XALATAN) 0.005 % ophthalmic solution Place 1 drop into both eyes at bedtime.    [provider]  lisinopril-hydrochlorothiazide (PRINZIDE,ZESTORETIC) 20-12.5 MG tablet Take 1 tablet by mouth every morning.     [provider]  ocrelizumab 600 mg in sodium chloride 0.9 % 500 mL Inject 600 mg into the vein every 6 (six) months.    [provider]  sildenafil (REVATIO) 20 MG tablet Take 20 mg by mouth as needed.    [provider]  tiZANidine (ZANAFLEX) 2 MG tablet Take 1 tablet (2 mg total) by mouth every 8 (eight) hours as needed for muscle spasms. 04/24/21   Suzzanne Cloud, NP      Allergies    Morphine and related and Morphine and related    Review of Systems   Review of Systems  Psychiatric/Behavioral:  Positive for confusion.   All other systems reviewed and are negative.  Physical Exam Updated Vital Signs BP (!) 179/95    Pulse (!) 151    Temp 99.1 F (37.3 C) (Rectal)    Resp 17    Ht 6\' 2"  (1.88 m)    Wt 77.1 kg    SpO2 95%    BMI 21.83 kg/m  Physical Exam Vitals and nursing note reviewed.  Constitutional:  General: He is not in acute distress.    Appearance: Normal appearance. He is well-developed.  HENT:     Head: Normocephalic and atraumatic.  Eyes:     Conjunctiva/sclera: Conjunctivae normal.     Pupils: Pupils are equal, round, and reactive to light.  Cardiovascular:     Rate and Rhythm: Normal rate and regular rhythm.     Heart sounds: Normal heart sounds.  Pulmonary:     Effort: Pulmonary effort is normal. No respiratory distress.     Breath sounds: Normal breath sounds.  Abdominal:     General: There is no distension.     Palpations: Abdomen is soft.     Tenderness: There is no abdominal tenderness.  Musculoskeletal:        General: No deformity. Normal range of motion.     Cervical back:  Normal range of motion and neck supple.  Skin:    General: Skin is warm and dry.     Comments: Early skin breakdown along the right lateral thigh consistent with pressure injury related to possibly being immobile in patient's wheelchair.  Neurological:     General: No focal deficit present.     Mental Status: He is alert.     Comments: Alert, oriented to person, place.  Patient cannot provide details as to the events of the last 24 to 48 hours.  Normal speech  No facial droop      ED Results / Procedures / Treatments   Labs (all labs ordered are listed, but only abnormal results are displayed) Labs Reviewed  COMPREHENSIVE METABOLIC PANEL - Abnormal; Notable for the following components:      Result Value   Glucose, Bld 103 (*)    BUN 56 (*)    Creatinine, Ser 4.03 (*)    GFR, Estimated 16 (*)    All other components within normal limits  SALICYLATE LEVEL - Abnormal; Notable for the following components:   Salicylate Lvl [id number] (*)    All other components within normal limits  ACETAMINOPHEN LEVEL - Abnormal; Notable for the following components:   Acetaminophen (Tylenol), Serum <10 (*)    All other components within normal limits  URINALYSIS, ROUTINE W REFLEX MICROSCOPIC - Abnormal; Notable for the following components:   APPearance HAZY (*)    Hgb urine dipstick LARGE (*)    Protein, ur 30 (*)    Leukocytes,Ua LARGE (*)    Bacteria, UA MANY (*)    All other components within normal limits  CK - Abnormal; Notable for the following components:   Total CK 2,624 (*)    All other components within normal limits  I-STAT CHEM 8, ED - Abnormal; Notable for the following components:   Potassium 5.3 (*)    BUN 75 (*)    Creatinine, Ser 4.00 (*)    All other components within normal limits  RESP PANEL BY RT-PCR (FLU A&B, COVID) [id number]  CULTURE, BLOOD (ROUTINE X 2)  CULTURE, BLOOD (ROUTINE X 2)  URINE CULTURE  CBC WITH DIFFERENTIAL/PLATELET  ETHANOL  LACTIC ACID, PLASMA   PROTIME-INR  AMMONIA  LACTIC ACID, PLASMA  RAPID URINE DRUG SCREEN, HOSP PERFORMED  CBG MONITORING, ED    EKG EKG Interpretation  Date/Time:  Friday June 06 2021 13:36:06 EST Ventricular Rate:  83 PR Interval:  169 QRS Duration: 124 QT Interval:  370 QTC Calculation: 435 R Axis:   -39 Text Interpretation: Sinus rhythm Probable left atrial enlargement Nonspecific IVCD with LAD Left ventricular hypertrophy Nonspecific T  abnrm, anterolateral leads Confirmed by Dene Gentry [phone]) on 06/06/2021 1:41:48 PM  Radiology CT Head Wo Contrast  Result Date: 06/06/2021 CLINICAL DATA:  Mental status change, unknown cause EXAM: CT HEAD WITHOUT CONTRAST TECHNIQUE: Contiguous axial images were obtained from the base of the skull through the vertex without intravenous contrast. RADIATION DOSE REDUCTION: This exam was performed according to the departmental dose-optimization program which includes automated exposure control, adjustment of the mA and/or kV according to patient size and/or use of iterative reconstruction technique. COMPARISON:  Brain MRI 04/02/2020 FINDINGS: Brain: No evidence of acute intracranial hemorrhage or extra-axial collection.No evidence of mass lesion/concern mass effect.The ventricles are normal in size.Confluent periventricular and subcortical white matter hypoattenuation, which is nonspecific but likely sequela of chronic small vessel ischemic disease.Mild cerebral atrophy Vascular: No hyperdense vessel or unexpected calcification. Skull: Normal. Negative for fracture or focal lesion. Sinuses/Orbits: Mucous retention cyst in left maxillary sinus. Other: None. IMPRESSION: No acute intracranial abnormality. Chronic small vessel ischemic disease. Electronically Signed   By: Maurine Simmering M.D.   On: 06/06/2021 15:07   DG Chest Port 1 View  Result Date: 06/06/2021 CLINICAL DATA:  Altered mental status EXAM: PORTABLE CHEST 1 VIEW COMPARISON:  Previous chest radiographs done on  08/12/2017 and CT done on 05/02/2020 FINDINGS: There is marked elevation of left hemidiaphragm. Cardiac size is within normal limits. There are no signs of pulmonary edema or new focal infiltrates. There is minimal blunting of left lateral CP angle. There is no pneumothorax. There is previous internal fixation in the right humerus. IMPRESSION: There are no signs of pulmonary edema or new focal infiltrates. Electronically Signed   By: Elmer Picker M.D.   On: 06/06/2021 13:53    Procedures Procedures    Medications Ordered in ED Medications  sodium chloride 0.9 % bolus 500 mL (0 mLs Intravenous Stopped 06/06/21 1458)  cefTRIAXone (ROCEPHIN) 1 g in sodium chloride 0.9 % 100 mL IVPB (0 g Intravenous Stopped 06/06/21 1458)  sodium chloride 0.9 % bolus 1,000 mL (1,000 mLs Intravenous New Bag/Given 06/06/21 1514)    ED Course/ Medical Decision Making/ A&P                           Medical Decision Making Amount and/or Complexity of Data Reviewed Labs: ordered. Radiology: ordered.    Medical Screen Complete  This patient presented to the ED with complaint of AMS.  This complaint involves an extensive number of treatment options. The initial differential diagnosis includes, but is not limited to, metabolic abnormality, AKI, infection, intracranial allergy, etc.  This presentation is: Acute, Self-Limited, Previously Undiagnosed, Uncertain Prognosis, Complicated, Systemic Symptoms, and Threat to Life/Bodily Function   Patient presents with reported alteration in mental status.  Patient's labs are significant for elevated creatinine at 4.0.  BUN today is 56.  Potassium is 4.2.  Patient with elevated CK at 2624.  UA obtained is also suggestive of possible concurrent UTI.  Patient with evidence of dehydration on exam.  Patient would benefit from admission for further work-up and treatment.    Co morbidities that complicated the patient's evaluation  MS   Additional history  obtained:  Additional history obtained from EMS External records from outside sources obtained and reviewed including prior ED visits and prior Inpatient records.    Lab Tests:  I ordered and personally interpreted labs.  The pertinent results include: CBC, CMP, CK, UA, COVID, flu   Imaging Studies ordered:  I ordered imaging  studies including head CT, chest x-ray I independently visualized and interpreted obtained imaging which showed no acute pathology I agree with the radiologist interpretation.   Cardiac Monitoring:  The patient was maintained on a cardiac monitor.  I personally viewed and interpreted the cardiac monitor which showed an underlying rhythm of: NSR   Medicines ordered:  I ordered medication including IV fluids, Rocephin for dehydration, UTI Reevaluation of the patient after these medicines showed that the patient: improved    Problem List / ED Course:  AMS, AKI, UTI   Reevaluation:  After the interventions noted above, I reevaluated the patient and found that they have: improved   Disposition:  After consideration of the diagnostic results and the patients response to treatment, I feel that the patent would benefit from admission for further work-up and treatment.          Final Clinical Impression(s) / ED Diagnoses Final diagnoses:  Altered mental status, unspecified altered mental status type  AKI (acute kidney injury) ([place])  Urinary tract infection without hematuria, site unspecified    Rx / DC Orders ED Discharge Orders     None         Valarie Merino, MD 06/06/21 1538

## 2021-06-07 DIAGNOSIS — N179 Acute kidney failure, unspecified: Secondary | ICD-10-CM | POA: Diagnosis present

## 2021-06-07 DIAGNOSIS — T502X5A Adverse effect of carbonic-anhydrase inhibitors, benzothiadiazides and other diuretics, initial encounter: Secondary | ICD-10-CM | POA: Diagnosis present

## 2021-06-07 DIAGNOSIS — N400 Enlarged prostate without lower urinary tract symptoms: Secondary | ICD-10-CM | POA: Diagnosis present

## 2021-06-07 DIAGNOSIS — E785 Hyperlipidemia, unspecified: Secondary | ICD-10-CM | POA: Diagnosis present

## 2021-06-07 DIAGNOSIS — R4182 Altered mental status, unspecified: Secondary | ICD-10-CM | POA: Diagnosis not present

## 2021-06-07 DIAGNOSIS — G929 Unspecified toxic encephalopathy: Secondary | ICD-10-CM | POA: Diagnosis not present

## 2021-06-07 DIAGNOSIS — G928 Other toxic encephalopathy: Secondary | ICD-10-CM

## 2021-06-07 DIAGNOSIS — T481X5A Adverse effect of skeletal muscle relaxants [neuromuscular blocking agents], initial encounter: Secondary | ICD-10-CM | POA: Diagnosis present

## 2021-06-07 DIAGNOSIS — Z885 Allergy status to narcotic agent status: Secondary | ICD-10-CM | POA: Diagnosis not present

## 2021-06-07 DIAGNOSIS — E860 Dehydration: Secondary | ICD-10-CM | POA: Diagnosis present

## 2021-06-07 DIAGNOSIS — I10 Essential (primary) hypertension: Secondary | ICD-10-CM

## 2021-06-07 DIAGNOSIS — T464X5A Adverse effect of angiotensin-converting-enzyme inhibitors, initial encounter: Secondary | ICD-10-CM | POA: Diagnosis present

## 2021-06-07 DIAGNOSIS — T428X5A Adverse effect of antiparkinsonism drugs and other central muscle-tone depressants, initial encounter: Secondary | ICD-10-CM | POA: Diagnosis present

## 2021-06-07 DIAGNOSIS — G8389 Other specified paralytic syndromes: Secondary | ICD-10-CM | POA: Diagnosis present

## 2021-06-07 DIAGNOSIS — T426X5A Adverse effect of other antiepileptic and sedative-hypnotic drugs, initial encounter: Secondary | ICD-10-CM | POA: Diagnosis present

## 2021-06-07 DIAGNOSIS — F32A Depression, unspecified: Secondary | ICD-10-CM | POA: Diagnosis present

## 2021-06-07 DIAGNOSIS — G35 Multiple sclerosis: Secondary | ICD-10-CM | POA: Diagnosis present

## 2021-06-07 DIAGNOSIS — Z20822 Contact with and (suspected) exposure to covid-19: Secondary | ICD-10-CM | POA: Diagnosis present

## 2021-06-07 LAB — COMPREHENSIVE METABOLIC PANEL
ALT: 25 U/L (ref 0–44)
AST: 74 U/L — ABNORMAL HIGH (ref 15–41)
Albumin: 3.4 g/dL — ABNORMAL LOW (ref 3.5–5.0)
Alkaline Phosphatase: 34 U/L — ABNORMAL LOW (ref 38–126)
Anion gap: 12 (ref 5–15)
BUN: 43 mg/dL — ABNORMAL HIGH (ref 6–20)
CO2: 17 mmol/L — ABNORMAL LOW (ref 22–32)
Calcium: 9.1 mg/dL (ref 8.9–10.3)
Chloride: 112 mmol/L — ABNORMAL HIGH (ref 98–111)
Creatinine, Ser: 2.42 mg/dL — ABNORMAL HIGH (ref 0.61–1.24)
GFR, Estimated: 30 mL/min — ABNORMAL LOW (ref >60)
Glucose, Bld: 93 mg/dL (ref 70–99)
Potassium: 4 mmol/L (ref 3.5–5.1)
Sodium: 141 mmol/L (ref 135–145)
Total Bilirubin: 0.9 mg/dL (ref 0.3–1.2)
Total Protein: 5.7 g/dL — ABNORMAL LOW (ref 6.5–8.1)

## 2021-06-07 LAB — BASIC METABOLIC PANEL
Anion gap: 7 (ref 5–15)
BUN: 36 mg/dL — ABNORMAL HIGH (ref 6–20)
CO2: 22 mmol/L (ref 22–32)
Calcium: 9.4 mg/dL (ref 8.9–10.3)
Chloride: 111 mmol/L (ref 98–111)
Creatinine, Ser: 2.06 mg/dL — ABNORMAL HIGH (ref 0.61–1.24)
GFR, Estimated: 37 mL/min — ABNORMAL LOW (ref >60)
Glucose, Bld: 102 mg/dL — ABNORMAL HIGH (ref 70–99)
Potassium: 4 mmol/L (ref 3.5–5.1)
Sodium: 140 mmol/L (ref 135–145)

## 2021-06-07 LAB — HIV ANTIBODY (ROUTINE TESTING W REFLEX): HIV Screen 4th Generation wRfx: NONREACTIVE

## 2021-06-07 LAB — CBC
HCT: 33.3 % — ABNORMAL LOW (ref 39.0–52.0)
Hemoglobin: 11.1 g/dL — ABNORMAL LOW (ref 13.0–17.0)
MCH: 30.9 pg (ref 26.0–34.0)
MCHC: 33.3 g/dL (ref 30.0–36.0)
MCV: 92.8 fL (ref 80.0–100.0)
Platelets: 201 10*3/uL (ref 150–400)
RBC: 3.59 MIL/uL — ABNORMAL LOW (ref 4.22–5.81)
RDW: 12.5 % (ref 11.5–15.5)
WBC: 7.1 10*3/uL (ref 4.0–10.5)
nRBC: 0 % (ref 0.0–0.2)

## 2021-06-07 LAB — LACTIC ACID, PLASMA: Lactic Acid, Venous: 1.1 mmol/L (ref 0.5–1.9)

## 2021-06-07 LAB — TSH: TSH: 1.109 u[IU]/mL (ref 0.350–4.500)

## 2021-06-07 MED ORDER — ENOXAPARIN SODIUM 40 MG/0.4ML IJ SOSY
40.0000 mg | PREFILLED_SYRINGE | INTRAMUSCULAR | Status: DC
  Administered 2021-06-07 – 2021-06-09 (×3): 40 mg via SUBCUTANEOUS
  Filled 2021-06-07 (×3): qty 0.4

## 2021-06-07 MED ORDER — LACTATED RINGERS IV BOLUS
500.0000 mL | Freq: Once | INTRAVENOUS | Status: AC
  Administered 2021-06-07: 500 mL via INTRAVENOUS

## 2021-06-07 MED ORDER — AMLODIPINE BESYLATE 10 MG PO TABS
10.0000 mg | ORAL_TABLET | Freq: Every day | ORAL | Status: DC
  Administered 2021-06-08 – 2021-06-10 (×3): 10 mg via ORAL
  Filled 2021-06-07 (×3): qty 1

## 2021-06-07 NOTE — Progress Notes (Signed)
HD#0 SUBJECTIVE:  Patient Summary: [name] is a 41 yom with a hx of MS who presents to the emergency department via EMS with altered mental status  Overnight Events: no acute events overnight    Interm History: patient states he is feeling improved today. Appears more oriented. Still having trouble with year. Is answering questions more appropriately. Continues to ask questions about the whereabouts of his phone.   OBJECTIVE:  Vital Signs: Vitals:   06/07/21 0035 06/07/21 0348 06/07/21 0738 06/07/21 1128  BP: (!) 148/77 (!) 165/85 (!) 176/75 (!) 160/83  Pulse: 70 62 66 88  Resp: 17 17 20 18   Temp: 99.3 F (37.4 C) 98.7 F (37.1 C) 97.8 F (36.6 C) 98.6 F (37 C)  TempSrc: Oral Oral Oral Oral  SpO2: 100% 100% 100% 99%  Weight:      Height:       SpO2: 99 %  Filed Weights   06/06/21 1358  Weight: 77.1 kg     Intake/Output Summary (Last 24 hours) at 06/07/2021 1226 Last data filed at 06/07/2021 0600 Gross per 24 hour  Intake 943.33 ml  Output 1200 ml  Net -256.67 ml   Net IO Since Admission: -256.67 mL [06/07/21 1226]  Physical Exam: Constitutional: chronically ill appearing male, in no acute distress HENT: normocephalic atraumatic, mucous membranes moist Eyes: conjunctiva non-erythematous Cardiovascular: regular rate and rhythm, no m/r/g Pulmonary/Chest: normal work of breathing on room air, lungs clear to auscultation bilaterally Abdominal: soft, non-tender, non-distended MSK: decreased bulk and tone bilateral lower extremities Neurological: alert, oriented to person and place only. Improved following of commands. PEERL, no facial droop noted. Strength and sensation intact bilateral upper extremities. Dysdiadochokinesia and dysmetria bilaterally improved. Sensation intact bilateral lower, strength 2+/5 bilaterally Skin: warm and dry, poor turgor Psych: unable to assess  Patient Lines/Drains/Airways Status     Active Line/Drains/Airways     Name  Placement date Placement time Site Days   Peripheral IV 06/06/21 20 G Anterior;Left Forearm 06/06/21  1346  Forearm  1   Peripheral IV 06/06/21 20 G Right Antecubital 06/06/21  1429  Antecubital  1   Urethral Catheter Gloria RN Straight-tip 16 Fr. 06/06/21  1524  Straight-tip  1            Pertinent Labs: CBC Latest Ref Rng & Units 06/07/2021 06/06/2021 06/06/2021  WBC 4.0 - 10.5 K/uL 7.1 - 7.2  Hemoglobin 13.0 - 17.0 g/dL 11.1(L) 13.3 14.0  Hematocrit 39.0 - 52.0 % 33.3(L) 39.0 41.5  Platelets 150 - 400 K/uL 201 - 263    CMP Latest Ref Rng & Units 06/07/2021 06/07/2021 06/06/2021  Glucose 70 - 99 mg/dL 102(H) 93 99  BUN 6 - 20 mg/dL 36(H) 43(H) 75(H)  Creatinine 0.61 - 1.24 mg/dL 2.06(H) 2.42(H) 4.00(H)  Sodium 135 - 145 mmol/L 140 141 141  Potassium 3.5 - 5.1 mmol/L 4.0 4.0 5.3(H)  Chloride 98 - 111 mmol/L 111 112(H) 111  CO2 22 - 32 mmol/L 22 17(L) -  Calcium 8.9 - 10.3 mg/dL 9.4 9.1 -  Total Protein 6.5 - 8.1 g/dL - 5.7(L) -  Total Bilirubin 0.3 - 1.2 mg/dL - 0.9 -  Alkaline Phos 38 - 126 U/L - 34(L) -  AST 15 - 41 U/L - 74(H) -  ALT 0 - 44 U/L - 25 -    No results for input(s): GLUCAP in the last 72 hours.   Pertinent Imaging: CT Head Wo Contrast  Result Date: 06/06/2021 CLINICAL DATA:  Mental status change, unknown cause EXAM: CT HEAD WITHOUT CONTRAST TECHNIQUE: Contiguous axial images were obtained from the base of the skull through the vertex without intravenous contrast. RADIATION DOSE REDUCTION: This exam was performed according to the departmental dose-optimization program which includes automated exposure control, adjustment of the mA and/or kV according to patient size and/or use of iterative reconstruction technique. COMPARISON:  Brain MRI 04/02/2020 FINDINGS: Brain: No evidence of acute intracranial hemorrhage or extra-axial collection.No evidence of mass lesion/concern mass effect.The ventricles are normal in size.Confluent periventricular and subcortical white  matter hypoattenuation, which is nonspecific but likely sequela of chronic small vessel ischemic disease.Mild cerebral atrophy Vascular: No hyperdense vessel or unexpected calcification. Skull: Normal. Negative for fracture or focal lesion. Sinuses/Orbits: Mucous retention cyst in left maxillary sinus. Other: None. IMPRESSION: No acute intracranial abnormality. Chronic small vessel ischemic disease. Electronically Signed   By: Maurine Simmering M.D.   On: 06/06/2021 15:07   US RENAL  Result Date: 06/06/2021 CLINICAL DATA:  Aki eye. EXAM: RENAL / URINARY TRACT ULTRASOUND COMPLETE COMPARISON:  None FINDINGS: Right Kidney: Renal measurements: 11.6 x 4.3 x 4.9 cm = volume: 126 mL. Echogenicity increased. No mass or hydronephrosis visualized. Left Kidney: Renal measurements: 10.1 x 6.8 x 5.1 cm = volume: 183 mL. Echogenicity increased. No mass or hydronephrosis visualized. Urinary bladder: Not visualized.  Per ultrasound technologist, Foley catheter. Other: None. IMPRESSION: Increased renal echogenicity bilaterally suggestive of renal parenchymal disease. Electronically Signed   By: Iven Finn M.D.   On: 06/06/2021 17:34   DG Chest Port 1 View  Result Date: 06/06/2021 CLINICAL DATA:  Altered mental status EXAM: PORTABLE CHEST 1 VIEW COMPARISON:  Previous chest radiographs done on 08/12/2017 and CT done on 05/02/2020 FINDINGS: There is marked elevation of left hemidiaphragm. Cardiac size is within normal limits. There are no signs of pulmonary edema or new focal infiltrates. There is minimal blunting of left lateral CP angle. There is no pneumothorax. There is previous internal fixation in the right humerus. IMPRESSION: There are no signs of pulmonary edema or new focal infiltrates. Electronically Signed   By: Elmer Picker M.D.   On: 06/06/2021 13:53   EEG adult  Result Date: 06/07/2021 Robyne Peers, MD     06/07/2021  9:03 AM [facility] Routine EEG Report Date  and Time of Study: 06/06/21 at 2254. Duration: 23 minutes Indication: Spells, Eval for Seizures or altered mentation. Technical Summary: A routine 20-channel electroencephalogram using the international 10-20 system of electrode placement was performed. Background: Symmetric 8.5 Hz posterior dominant rhythm (PDR) that attenuates with eye-opening. The background is reactive to stimulation. States: Awake Abnormalities: No abnormalities. No epileptic activity was seen. No seizures. Activation Procedures: Hyperventilation: Not performed Photic Stimulation: Performed and no abnormalities Classification: Normal Diagnosis: This is a normal awake EEG. There were no epileptiform discharges seen. No seizures were seen. Clinical Correlation: This EEG study is within normal limits. The absence of interictal epileptiform abnormalities does not exclude the diagnosis of a seizure disorder. Allena Earing, MD CNP/Epilepsy [facility] [phone] Case: [id number]   ASSESSMENT/PLAN:  Assessment: Principal Problem:   AMS (altered mental status)  #altered mental status - Patient remains confused, not oriented to time. Thought process remains difficult to follow occasionally. However, he appears improved overall today. Responding more appropriately to questions and able to follow commands more appropriately.  He appears to be improving with fluids and holding baclofen and gabapentin. Etiology likely medication overdose in the setting of AKI.  - discussed  with neurology who recommended continued medication washout for today. Pending continued improvement, can restart Baclofen at low dose 2.5mg  BID tomorrow. +/- Gabapentin.  - If patient fails to show improvement, can consider MRI of head w/o contrast due to AKI.   # AKI ? UTI Hx BPH Cr and BUN appears to be improving with IV fluids.  Electrolytes have remained stable.  - Continue IV fluids and monitor with daily BMP   ? UTI - UA with bacteria and leukocytes.  Given one dose CTX. He denies urinary symptoms. He has been afebrile with no leukocytosis. - will hold off on starting IV abx for now. F/u Ucx.    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 On ocrevus - can restart baclofen low dose 2.5mg  BID tomorrow if improvement in mental status.   HLD Lipitor 40mg    Depression - fluoxetine 40  Code: Full Prior to Admission Living Arrangement: Home, living self Dispo: Admit patient to Inpatient with expected length of stay greater than 2 midnights.    Signature: Delene Ruffini, MD  Internal Medicine Resident, PGY-1 [facility]  Pager: [phone] 12:26 PM, 06/07/2021   Please contact the on call pager after 5 pm and on weekends at [phone].

## 2021-06-07 NOTE — Procedures (Addendum)
[facility]  Routine EEG Report  Date and Time of Study: 06/06/21 at 2254.  Duration: 23 minutes  Indication: Spells, Eval for Seizures or altered mentation.  Technical Summary: A routine 20-channel electroencephalogram using the international 10-20 system of electrode placement was performed.  Background: Symmetric 8.5 Hz posterior dominant rhythm (PDR) that attenuates with eye-opening. The background is reactive to stimulation.  States: Awake  Abnormalities: No abnormalities. No epileptic activity was seen. No seizures.  Activation Procedures:  Hyperventilation: Not performed  Photic Stimulation: Performed and no abnormalities  Classification: Normal  Diagnosis: This is a normal awake EEG. There were no epileptiform discharges seen. No seizures were seen.  Clinical Correlation: This EEG study is within normal limits. The absence of interictal epileptiform abnormalities does not exclude the diagnosis of a seizure disorder.  Flonnie Overman, MD CNP/Epilepsy [facility] [phone] Case: [id number]

## 2021-06-08 LAB — CBC
HCT: 33.1 % — ABNORMAL LOW (ref 39.0–52.0)
Hemoglobin: 11 g/dL — ABNORMAL LOW (ref 13.0–17.0)
MCH: 30.9 pg (ref 26.0–34.0)
MCHC: 33.2 g/dL (ref 30.0–36.0)
MCV: 93 fL (ref 80.0–100.0)
Platelets: 200 10*3/uL (ref 150–400)
RBC: 3.56 MIL/uL — ABNORMAL LOW (ref 4.22–5.81)
RDW: 12.4 % (ref 11.5–15.5)
WBC: 8.4 10*3/uL (ref 4.0–10.5)
nRBC: 0 % (ref 0.0–0.2)

## 2021-06-08 LAB — BASIC METABOLIC PANEL
Anion gap: 10 (ref 5–15)
BUN: 29 mg/dL — ABNORMAL HIGH (ref 6–20)
CO2: 20 mmol/L — ABNORMAL LOW (ref 22–32)
Calcium: 9.4 mg/dL (ref 8.9–10.3)
Chloride: 108 mmol/L (ref 98–111)
Creatinine, Ser: 1.7 mg/dL — ABNORMAL HIGH (ref 0.61–1.24)
GFR, Estimated: 46 mL/min — ABNORMAL LOW (ref >60)
Glucose, Bld: 104 mg/dL — ABNORMAL HIGH (ref 70–99)
Potassium: 3.8 mmol/L (ref 3.5–5.1)
Sodium: 138 mmol/L (ref 135–145)

## 2021-06-08 MED ORDER — LACTATED RINGERS IV BOLUS
1000.0000 mL | Freq: Once | INTRAVENOUS | Status: AC
  Administered 2021-06-08: 1000 mL via INTRAVENOUS

## 2021-06-08 MED ORDER — BACLOFEN 10 MG PO TABS
5.0000 mg | ORAL_TABLET | Freq: Every day | ORAL | Status: DC
  Administered 2021-06-08 – 2021-06-09 (×2): 5 mg via ORAL
  Filled 2021-06-08 (×2): qty 1

## 2021-06-08 NOTE — Progress Notes (Signed)
HD#1 SUBJECTIVE:  Patient Summary: [name] is a 37 yom with a hx of MS who presents to the emergency department via EMS with altered mental status  Overnight Events: no acute events overnight    Interm History:   Patient denies any pain. Does state that he felt he was confused when he first came to the hospital and feels this was because he took additional doses of his medications accidentally.  He is eating and drinking without difficulty. He has a foley in with good UOP.   OBJECTIVE:  Vital Signs: Vitals:   06/07/21 1932 06/07/21 2303 06/08/21 0326 06/08/21 0900  BP: 126/83 134/87 117/78 118/86  Pulse: 81 71 (!) 58 72  Resp: 20 20 20 20   Temp: 97.8 F (36.6 C) 99.5 F (37.5 C) 98 F (36.7 C) 98.5 F (36.9 C)  TempSrc: Oral Oral Oral Oral  SpO2: 99% 99% 100% 100%  Weight:      Height:       SpO2: 100 %  Filed Weights   06/06/21 1358  Weight: 77.1 kg     Intake/Output Summary (Last 24 hours) at 06/08/2021 1008 Last data filed at 06/07/2021 2156 Gross per 24 hour  Intake 240 ml  Output 1200 ml  Net -960 ml    Net IO Since Admission: -1,166.67 mL [06/08/21 1008]  Physical Exam: Constitutional: Thin, chronically ill appearing male, in no acute distress HENT: normocephalic atraumatic, mucous membranes moist Eyes: conjunctiva non-erythematous Cardiovascular: regular rate and rhythm, no m/r/g Pulmonary/Chest: normal work of breathing on room air, lungs clear to auscultation bilaterally Abdominal: soft, non-tender, non-distended MSK: decreased bulk and tone bilateral lower extremities Neurological: alert, oriented to person and place only. Follows commands. Strength and sensation intact bilateral upper extremities. Sensation intact bilateral lower extremities, strength 2+/5 bilaterally Skin: warm and dry, poor turgor Psych: Broad affect  Patient Lines/Drains/Airways Status     Active Line/Drains/Airways     Name Placement date Placement time Site  Days   Peripheral IV 06/06/21 20 G Anterior;Left Forearm 06/06/21  1346  Forearm  1   Peripheral IV 06/06/21 20 G Right Antecubital 06/06/21  1429  Antecubital  1   Urethral Catheter Gloria RN Straight-tip 16 Fr. 06/06/21  1524  Straight-tip  1            Pertinent Labs: CBC Latest Ref Rng & Units 06/08/2021 06/07/2021 06/06/2021  WBC 4.0 - 10.5 K/uL 8.4 7.1 -  Hemoglobin 13.0 - 17.0 g/dL 11.0(L) 11.1(L) 13.3  Hematocrit 39.0 - 52.0 % 33.1(L) 33.3(L) 39.0  Platelets 150 - 400 K/uL 200 201 -    CMP Latest Ref Rng & Units 06/08/2021 06/07/2021 06/07/2021  Glucose 70 - 99 mg/dL 104(H) 102(H) 93  BUN 6 - 20 mg/dL 29(H) 36(H) 43(H)  Creatinine 0.61 - 1.24 mg/dL 1.70(H) 2.06(H) 2.42(H)  Sodium 135 - 145 mmol/L 138 140 141  Potassium 3.5 - 5.1 mmol/L 3.8 4.0 4.0  Chloride 98 - 111 mmol/L 108 111 112(H)  CO2 22 - 32 mmol/L 20(L) 22 17(L)  Calcium 8.9 - 10.3 mg/dL 9.4 9.4 9.1  Total Protein 6.5 - 8.1 g/dL - - 5.7(L)  Total Bilirubin 0.3 - 1.2 mg/dL - - 0.9  Alkaline Phos 38 - 126 U/L - - 34(L)  AST 15 - 41 U/L - - 74(H)  ALT 0 - 44 U/L - - 25    No results for input(s): GLUCAP in the last 72 hours.   Pertinent Imaging: No results found.  ASSESSMENT/PLAN:  Assessment: Principal Problem:   AMS (altered mental status) Active Problems:   AKI (acute kidney injury) ([place])   Toxic metabolic encephalopathy  #altered mental status - Patient appears to be at his baseline level of mentation this AM. Though remains not oriented to time. Thought process is linear and able to converse appropriately. He states that he feels he will need additional help at home, currently he has an aide that comes to his home three times a day.  Etiology likely medication overdose in the setting of AKI due to dehydration.  - discussed with neurology who recommended continued medication washout. - If patient fails to show improvement, can consider MRI of head w/o contrast due to AKI.   # AKI ? UTI Hx BPH Cr  and BUN appears to be improving with IV fluids.  Electrolytes have remained stable.  - Continue IV fluids and monitor with daily BMP   ? UTI - UA with bacteria and leukocytes. Given one dose CTX. He denies urinary symptoms. He has been afebrile with no leukocytosis. - will hold off on starting IV abx for now. F/u Ucx.    HTN Home meds lisinopril hctz Amlodipine increased to 10mg  2/11. His SBP in 110s to 130s.  -Continue current antihypertensive regimen.    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 States he no longer walks much at home, even with assistive devices.  - Follows with neurology. On ocrevus - Given his mentation is markedly improved will resume baclofen at low dose 2.5mg  BID    HLD Lipitor 40mg    Depression - fluoxetine 40  Code: Full Prior to Admission Living Arrangement: Home, living self, will reach out to [facility] if patient qualifies for additional assistance at home.  Dispo: Admit patient to Inpatient with expected length of stay greater than 2 midnights.    Signature: Rick Duff, MD PGY-2 Internal Medicine  Pager [phone]  10:08 AM, 06/08/2021   Please contact the on call pager after 5 pm and on weekends at [phone].

## 2021-06-09 DIAGNOSIS — R4182 Altered mental status, unspecified: Secondary | ICD-10-CM | POA: Diagnosis not present

## 2021-06-09 LAB — BASIC METABOLIC PANEL
Anion gap: 11 (ref 5–15)
BUN: 25 mg/dL — ABNORMAL HIGH (ref 6–20)
CO2: 20 mmol/L — ABNORMAL LOW (ref 22–32)
Calcium: 9.7 mg/dL (ref 8.9–10.3)
Chloride: 108 mmol/L (ref 98–111)
Creatinine, Ser: 1.66 mg/dL — ABNORMAL HIGH (ref 0.61–1.24)
GFR, Estimated: 48 mL/min — ABNORMAL LOW (ref >60)
Glucose, Bld: 93 mg/dL (ref 70–99)
Potassium: 4 mmol/L (ref 3.5–5.1)
Sodium: 139 mmol/L (ref 135–145)

## 2021-06-09 LAB — URINE CULTURE: Culture: 60000 — AB

## 2021-06-09 LAB — CK: Total CK: 1911 U/L — ABNORMAL HIGH (ref 49–397)

## 2021-06-09 LAB — CBC
HCT: 33.4 % — ABNORMAL LOW (ref 39.0–52.0)
Hemoglobin: 11.2 g/dL — ABNORMAL LOW (ref 13.0–17.0)
MCH: 31.2 pg (ref 26.0–34.0)
MCHC: 33.5 g/dL (ref 30.0–36.0)
MCV: 93 fL (ref 80.0–100.0)
Platelets: 190 10*3/uL (ref 150–400)
RBC: 3.59 MIL/uL — ABNORMAL LOW (ref 4.22–5.81)
RDW: 12.5 % (ref 11.5–15.5)
WBC: 6.6 10*3/uL (ref 4.0–10.5)
nRBC: 0 % (ref 0.0–0.2)

## 2021-06-09 LAB — MAGNESIUM: Magnesium: 1.8 mg/dL (ref 1.7–2.4)

## 2021-06-09 NOTE — NC FL2 (Signed)
MEDICAID FL2 LEVEL OF CARE SCREENING TOOL     IDENTIFICATION  Patient Name: [name] Birthdate: Nov 28, 1965 Sex: male Admission Date (Current Location): 06/06/2021  [facility] and [place] Number:  Producer, television/film/video and Address:  [facility] [place], [address], [place], [place] [address]      Provider Number: [id number]  Attending Physician Name and Address:  Earl Lagos, MD  Relative Name and Phone Number:  Janalyn Harder" [place], [phone]    Current Level of Care: Hospital Recommended Level of Care: Skilled Nursing Facility Prior Approval Number:    Date Approved/Denied:   PASRR Number: [id number]  Discharge Plan: SNF    Current Diagnoses: Patient Active Problem List   Diagnosis Date Noted   AKI (acute kidney injury) (HCC)    Toxic metabolic encephalopathy    AMS (altered mental status) 06/06/2021   Multiple sclerosis (HCC) 09/25/2020   Displaced fracture of lateral end of right clavicle, initial encounter for closed fracture 08/30/2017   Multiple sclerosis, 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   Abnormality of gait 11/28/2015   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   BPH (benign prostatic hyperplasia) 10/28/2015   Fracture of fibula with tibia, right, closed 12/09/2011   Muscle weakness (generalized) 12/09/2011   Difficulty in walking(719.7) 12/09/2011    Orientation RESPIRATION BLADDER Height & Weight     Self, Situation, Place  Normal Continent Weight: 170 lb (77.1 kg) Height:  6\' 2"  (188 cm)  BEHAVIORAL SYMPTOMS/MOOD NEUROLOGICAL BOWEL NUTRITION STATUS      Continent Diet (See DC summary)  AMBULATORY STATUS COMMUNICATION OF NEEDS Skin   Extensive Assist Verbally Skin abrasions (R hip abrasion)                       Personal Care Assistance Level of Assistance  Bathing, Feeding, Dressing Bathing Assistance: Maximum assistance Feeding assistance: Independent Dressing Assistance: Maximum  assistance     Functional Limitations Info  Sight, Hearing, Speech Sight Info: Adequate Hearing Info: Adequate Speech Info: Adequate    SPECIAL CARE FACTORS FREQUENCY  PT (By licensed PT), OT (By licensed OT)     PT Frequency: 5x week OT Frequency: 5x week            Contractures Contractures Info: Not present    Additional Factors Info  Code Status, Allergies Code Status Info: Full Allergies Info: Morphine and related           Current Medications (06/09/2021):  This is the current hospital active medication list Current Facility-Administered Medications  Medication Dose Route Frequency Provider Last Rate Last Admin   acetaminophen (TYLENOL) tablet 650 mg  650 mg Oral Q6H PRN 06/11/2021, MD       Or   acetaminophen (TYLENOL) suppository 650 mg  650 mg Rectal Q6H PRN Marolyn Haller, MD       amLODipine (NORVASC) tablet 10 mg  10 mg Oral Daily Marolyn Haller, MD   10 mg at 06/09/21 0813   baclofen (LIORESAL) tablet 5 mg  5 mg Oral QHS 06/11/21, MD   5 mg at 06/08/21 2106   enoxaparin (LOVENOX) injection 40 mg  40 mg Subcutaneous Q24H 2107, RPH   40 mg at 06/08/21 1647   folic acid (FOLVITE) tablet 1 mg  1 mg Oral Daily 08/06/21, MD   1 mg at 06/09/21 0813   multivitamin with minerals tablet 1 tablet  1 tablet Oral Daily 06/11/21, MD  1 tablet at 06/09/21 0813     Discharge Medications: Please see discharge summary for a list of discharge medications.  Relevant Imaging Results:  Relevant Lab Results:   Additional Information SS# [id number], [place]

## 2021-06-09 NOTE — Discharge Summary (Addendum)
Name: [name] MRN: [id number] DOB: 1966-03-11 56 y.o. PCP: [facility] [place]  Date of Admission: 06/06/2021  1:19 PM Date of Discharge: 06/10/2021 Attending Physician: Dr. Jimmye Norman  Discharge Diagnosis: Principal Problem:   AMS (altered mental status) Active Problems:   AKI (acute kidney injury) ([place])   Toxic metabolic encephalopathy    Discharge Medications: Allergies as of 06/10/2021       Reactions   Morphine And Related Hives   Morphine And Related Hives        Medication List     STOP taking these medications    lisinopril-hydrochlorothiazide 20-12.5 MG tablet Commonly known as: ZESTORETIC   STOOL SOFTENER PO   tiZANidine 2 MG tablet Commonly known as: ZANAFLEX       TAKE these medications    acetaminophen 325 MG tablet Commonly known as: TYLENOL Take 650 mg by mouth every 8 (eight) hours as needed for moderate pain. What changed: Another medication with the same name was added. Make sure you understand how and when to take each.   acetaminophen 325 MG tablet Commonly known as: TYLENOL Take 2 tablets (650 mg total) by mouth every 6 (six) hours as needed for mild pain (or Fever >/= 101). What changed: You were already taking a medication with the same name, and this prescription was added. Make sure you understand how and when to take each.   amLODipine 10 MG tablet Commonly known as: NORVASC Take 1 tablet (10 mg total) by mouth daily. Start taking on: June 11, 2021   aspirin EC 81 MG tablet Take 81 mg by mouth daily.   atorvastatin 40 MG tablet Commonly known as: LIPITOR Take 40 mg by mouth daily.   Baclofen 5 MG Tabs Take 5 mg by mouth 2 (two) times daily. What changed:  medication strength how much to take when to take this   FLUoxetine 40 MG capsule Commonly known as: PROZAC Take 40 mg by mouth every morning.   folic acid 1 MG tablet Commonly known as: FOLVITE Take 1 tablet (1 mg total) by  mouth daily. Start taking on: June 11, 2021   gabapentin 300 MG capsule Commonly known as: NEURONTIN Take 1 capsule (300 mg total) by mouth 2 (two) times daily.   multivitamin with minerals Tabs tablet Take 1 tablet by mouth daily. Start taking on: June 11, 2021   ocrelizumab 600 mg in sodium chloride 0.9 % 500 mL Inject 600 mg into the vein every 6 (six) months.   senna-docusate 8.6-50 MG tablet Commonly known as: Senokot-S Take 1 tablet by mouth daily as needed for mild constipation.   sildenafil 20 MG tablet Commonly known as: REVATIO Take 20 mg by mouth daily as needed (erectile dysfunction).   VITAMIN D3 PO Take 25 mcg by mouth daily.               Durable Medical Equipment  (From admission, onward)           Start     Ordered   06/09/21 1313  For home use only DME lightweight manual wheelchair with seat cushion  Once       Comments: Patient suffers from [place] which impairs their ability to perform daily activities like bathing, dressing, feeding, grooming, and toileting in the home.  A cane, crutch, or walker will not resolve  issue with performing activities of daily living. A wheelchair will allow patient to safely perform daily activities. Patient is not able to  propel themselves in the home using a standard weight wheelchair due to arm weakness, endurance, and general weakness. Patient can self propel in the lightweight wheelchair. Length of need Lifetime. Accessories: elevating leg rests (ELRs), wheel locks, extensions and anti-tippers.   06/09/21 1313            Disposition and follow-up:   Mr.[name] was discharged from [facility] in Stable condition.  At the hospital follow up visit please address:  1.  Follow-up:  a.  Regarding your acute kidney injury.    b.  Baclofen, tizanidine, gabapentin dose   c.  Blood pressure medications, when to resume lisinopril/hydrochlorothiazide   d.  2.  Labs / imaging  needed at time of follow-up: BMP (sCr)   3.  Pending labs/ test needing follow-up: None  4.  Medication Changes  Started:  Stopped:   Changed: Baclofen, reduce dose to 5 mg twice daily, patient will need slow up titration of this medication   Follow-up Appointments: [facility] Course by problem list:  #Altered mental status - Patient presented with altered mental status. He was noted to have an AKI with serum creatinine to 4. Otherwise his labs were unrevealing for etiology of his symptoms. Head imaging (CT head) obtained this hospitalization was also negative for bleed or mass. It was felt that the patient became drowsy with his anti spasmodic medications (baclofen, gabapentin, flexeril) and had decreased oral intake. He then developed an acute kidney injury which decreased the excretion of these medications, making him increasingly more confused and drowsy. Holding these medications while the patient was hospitalized resulted in resolution of his altered mentation and he returned to his baseline rapidly.   His baclofen was added back at a decreased dose of 5mg  two times daily and the patient will follow up with his PACE provider and neurologist for further up-titration of these medications.    # AKI Hx BPH Likely prerenal in the setting of decreased oral intake. Worsened by continued use of his antihypertensives lisinopril/HCTZ. His kidney function improved with IV fluids. His lisinopril/HCTz were held and patient was started on amlodipine. He will need a follow up with his PCP to check his BMP to ensure his serum creatinine is normalizing. Baseline appears to be around 1.2. And to decide if resumption of his lisinopril/HCTz is necessary.     #MS Spastic paresis Uses electric wheel chair, spastic paresis. States he no longer walks much at home, even with assistive devices. Follows with neurology. On ocrevus. On gabapentin, tizanidine, and baclofen for his  spasticity. Held these medications for gabapentin, tizanidine, and baclofen. But by the time of discharge increased his baclofen to 5mg  BID by the time of discharge.    HTN Home meds lisinopril hctz. These were held because of his AKI. He was started on amlodipine and his blood pressures were well controlled with SBPs 119 to 125. We will discharge patient on amlodipine 10mg  and patient will follow up with his PCP to decide if his other medications should be resumed.   HLD Continued his home Lipitor 40mg        Discharge Subjective:  Patient is back to his baseline. He denies any shortness of breath, chest pain, difficulty eating or drinking, he is stooling and urinating without difficulty.   Discharge Exam:   BP 127/75 (BP Location: Left Arm)    Pulse 67    Temp 98.6 F (37 C) (Oral)    Resp 20  Ht 6\' 2"  (1.88 m)    Wt 77.1 kg    SpO2 100%    BMI 21.83 kg/m  Constitutional: well-appearing sitting in bed, in no acute distress HENT: normocephalic atraumatic, mucous membranes moist Eyes: conjunctiva non-erythematous Neck: supple Cardiovascular: regular rate and rhythm, no m/r/g Pulmonary/Chest: normal work of breathing on room air, lungs clear to auscultation bilaterally Abdominal: soft, non-tender, non-distended MSK: normal bulk and tone Neurological: alert & oriented x3, 5/5 strength in bilateral upper extremities, 1/5 BLE Psych: Broad affect.   Pertinent Labs, Studies, and Procedures:  CBC Latest Ref Rng & Units 06/09/2021 06/08/2021 06/07/2021  WBC 4.0 - 10.5 K/uL 6.6 8.4 7.1  Hemoglobin 13.0 - 17.0 g/dL 11.2(L) 11.0(L) 11.1(L)  Hematocrit 39.0 - 52.0 % 33.4(L) 33.1(L) 33.3(L)  Platelets 150 - 400 K/uL 190 200 201    CMP Latest Ref Rng & Units 06/09/2021 06/08/2021 06/07/2021  Glucose 70 - 99 mg/dL 93 104(H) 102(H)  BUN 6 - 20 mg/dL 25(H) 29(H) 36(H)  Creatinine 0.61 - 1.24 mg/dL 1.66(H) 1.70(H) 2.06(H)  Sodium 135 - 145 mmol/L 139 138 140  Potassium 3.5 - 5.1 mmol/L 4.0 3.8  4.0  Chloride 98 - 111 mmol/L 108 108 111  CO2 22 - 32 mmol/L 20(L) 20(L) 22  Calcium 8.9 - 10.3 mg/dL 9.7 9.4 9.4  Total Protein 6.5 - 8.1 g/dL - - -  Total Bilirubin 0.3 - 1.2 mg/dL - - -  Alkaline Phos 38 - 126 U/L - - -  AST 15 - 41 U/L - - -  ALT 0 - 44 U/L - - -    CT Head Wo Contrast  Result Date: 06/06/2021 CLINICAL DATA:  Mental status change, unknown cause EXAM: CT HEAD WITHOUT CONTRAST TECHNIQUE: Contiguous axial images were obtained from the base of the skull through the vertex without intravenous contrast. RADIATION DOSE REDUCTION: This exam was performed according to the departmental dose-optimization program which includes automated exposure control, adjustment of the mA and/or kV according to patient size and/or use of iterative reconstruction technique. COMPARISON:  Brain MRI 04/02/2020 FINDINGS: Brain: No evidence of acute intracranial hemorrhage or extra-axial collection.No evidence of mass lesion/concern mass effect.The ventricles are normal in size.Confluent periventricular and subcortical white matter hypoattenuation, which is nonspecific but likely sequela of chronic small vessel ischemic disease.Mild cerebral atrophy Vascular: No hyperdense vessel or unexpected calcification. Skull: Normal. Negative for fracture or focal lesion. Sinuses/Orbits: Mucous retention cyst in left maxillary sinus. Other: None. IMPRESSION: No acute intracranial abnormality. Chronic small vessel ischemic disease. Electronically Signed   By: Maurine Simmering M.D.   On: 06/06/2021 15:07   US RENAL  Result Date: 06/06/2021 CLINICAL DATA:  [place]ki eye. EXAM: RENAL / URINARY TRACT ULTRASOUND COMPLETE COMPARISON:  None FINDINGS: Right Kidney: Renal measurements: 11.6 x 4.3 x 4.9 cm = volume: 126 mL. Echogenicity increased. No mass or hydronephrosis visualized. Left Kidney: Renal measurements: 10.1 x 6.8 x 5.1 cm = volume: 183 mL. Echogenicity increased. No mass or hydronephrosis visualized. Urinary bladder: Not  visualized.  Per ultrasound technologist, Foley catheter. Other: None. IMPRESSION: Increased renal echogenicity bilaterally suggestive of renal parenchymal disease. Electronically Signed   By: Iven Finn M.D.   On: 06/06/2021 17:34   DG Chest Port 1 View  Result Date: 06/06/2021 CLINICAL DATA:  Altered mental status EXAM: PORTABLE CHEST 1 VIEW COMPARISON:  Previous chest radiographs done on 08/12/2017 and CT done on 05/02/2020 FINDINGS: There is marked elevation of left hemidiaphragm. Cardiac size is within normal limits. There are  no signs of pulmonary edema or new focal infiltrates. There is minimal blunting of left lateral CP angle. There is no pneumothorax. There is previous internal fixation in the right humerus. IMPRESSION: There are no signs of pulmonary edema or new focal infiltrates. Electronically Signed   By: Elmer Picker M.D.   On: 06/06/2021 13:53   EEG adult  Result Date: 06/07/2021 Robyne Peers, MD     06/07/2021  9:03 AM [facility] Routine EEG Report Date and Time of Study: 06/06/21 at 2254. Duration: 23 minutes Indication: Spells, Eval for Seizures or altered mentation. Technical Summary: A routine 20-channel electroencephalogram using the international 10-20 system of electrode placement was performed. Background: Symmetric 8.5 Hz posterior dominant rhythm (PDR) that attenuates with eye-opening. The background is reactive to stimulation. States: Awake Abnormalities: No abnormalities. No epileptic activity was seen. No seizures. Activation Procedures: Hyperventilation: Not performed Photic Stimulation: Performed and no abnormalities Classification: Normal Diagnosis: This is a normal awake EEG. There were no epileptiform discharges seen. No seizures were seen. Clinical Correlation: This EEG study is within normal limits. The absence of interictal epileptiform abnormalities does not exclude the diagnosis of a seizure disorder. Allena Earing, MD CNP/Epilepsy [facility] [phone] Case: [id number]    Discharge Instructions:  Mr. Sawinski it was a pleasure taking care of you.  For your muscle relaxant, baclofen, we have reduced this dose as we believe this is what contributed to your confusion when he came to the hospital.  You have follow-up with your pace doctor this week and we are leaving it up to their discretion to increase this medication.  You also had some damage to your kidneys that was likely due to you not eating and drinking enough as well as your blood pressure medications.  We stopped your lisinopril/hydrochlorothiazide medication and started you on a medication called amlodipine for your blood pressure.  You will need to get labs to check your kidney function and your medications can be changed per your pace doctor.    Signed: Rick Duff, MD PGY-2 Internal Medicine  Pager [phone]

## 2021-06-09 NOTE — Evaluation (Signed)
Physical Therapy Evaluation Patient Details Name: [name] MRN: [id number] DOB: 1965/07/22 Today's Date: 06/09/2021  History of Present Illness  Pt is a 56 y/o male admitted secondary to AMS likely from medications. PMH includes MS, spastic quadriparesis, and HTN.  Clinical Impression  Pt admitted secondary to problem above with deficits below. Pt requiring mod to max A +2 to stand at EOB. HEavy posterior lean noted. Was unable to take steps to transfer to chair this session. Pt continues to present with some cognitive deficits and decreased safety awareness. Recommending SNF level therapies at d/c, however, pt may refuse. If pt refuses, will need max HH services. Will continue to follow acutely.      Recommendations for follow up therapy are one component of a multi-disciplinary discharge planning process, led by the attending physician.  Recommendations may be updated based on patient status, additional functional criteria and insurance authorization.  Follow Up Recommendations Skilled nursing-short term rehab (<3 hours/day) (if pt refuses, max HH services)    Assistance Recommended at Discharge Frequent or constant Supervision/Assistance  Patient can return home with the following  Two people to help with walking and/or transfers;Two people to help with bathing/dressing/bathroom;Assist for transportation;Assistance with cooking/housework    Equipment Recommendations Hospital bed  Recommendations for Other Services       Functional Status Assessment Patient has had a recent decline in their functional status and demonstrates the ability to make significant improvements in function in a reasonable and predictable amount of time.     Precautions / Restrictions Precautions Precautions: Fall Restrictions Weight Bearing Restrictions: No      Mobility  Bed Mobility Overal bed mobility: Needs Assistance Bed Mobility: Supine to Sit, Sit to Supine     Supine to sit: Mod  assist Sit to supine: Mod assist   General bed mobility comments: Assist with trunk and LEs. Required min to mod A to maintain sitting balance once sitting.    Transfers Overall transfer level: Needs assistance Equipment used: 2 person hand held assist Transfers: Sit to/from Stand Sit to Stand: Mod assist, Max assist, +2 physical assistance           General transfer comment: Required mod to max A +2 to come up to stand. Unable to take steps to pivot to chair    Ambulation/Gait                  Stairs            Wheelchair Mobility    Modified Rankin (Stroke Patients Only)       Balance Overall balance assessment: Needs assistance Sitting-balance support: Feet supported, Bilateral upper extremity supported Sitting balance-Leahy Scale: Poor Sitting balance - Comments: Reliant on BUE and min to mod A for sitting balance.   Standing balance support: Bilateral upper extremity supported Standing balance-Leahy Scale: Poor Standing balance comment: Reliant on BUE and external support. Posterior lean in standing.                             Pertinent Vitals/Pain Pain Assessment Pain Assessment: No/denies pain    Home Living Family/patient expects to be discharged to:: Private residence Living Arrangements: Alone Available Help at Discharge: Family;Available PRN/intermittently Type of Home: Apartment Home Access: Elevator (on the 9th floor)       Home Layout: One level Home Equipment: Wheelchair - power;Tub bench      Prior Function Prior Level of Function : Independent/Modified Independent  Mobility Comments: Normally independent with transfers to/from [facility] ADLs Comments: Normally independent with ADL tasks, but has been having trouble cooking and cleaning lately     Hand Dominance        Extremity/Trunk Assessment   Upper Extremity Assessment Upper Extremity Assessment: Defer to OT evaluation    Lower Extremity  Assessment Lower Extremity Assessment: Generalized weakness (spastic quadriparesis at baseline)    Cervical / Trunk Assessment Cervical / Trunk Assessment: Kyphotic  Communication   Communication: No difficulties  Cognition Arousal/Alertness: Awake/alert Behavior During Therapy: WFL for tasks assessed/performed Overall Cognitive Status: No family/caregiver present to determine baseline cognitive functioning                                 General Comments: Pt with slow processing and decreased safety awareness. Pt adamant his WC is on the [place].        General Comments      Exercises     Assessment/Plan    PT Assessment Patient needs continued PT services  PT Problem List Decreased strength;Decreased activity tolerance;Decreased balance;Decreased mobility;Decreased range of motion;Decreased cognition;Decreased safety awareness;Decreased coordination       PT Treatment Interventions DME instruction;Functional mobility training;Therapeutic exercise;Therapeutic activities;Balance training;Patient/family education;Neuromuscular re-education;Cognitive remediation;Wheelchair mobility training    PT Goals (Current goals can be found in the Care Plan section)  Acute Rehab PT Goals Patient Stated Goal: to go home PT Goal Formulation: With patient Time For Goal Achievement: 06/23/21 Potential to Achieve Goals: Fair    Frequency Min 3X/week     Co-evaluation               AM-PAC PT "6 Clicks" Mobility  Outcome Measure Help needed turning from your back to your side while in a flat bed without using bedrails?: A Lot Help needed moving from lying on your back to sitting on the side of a flat bed without using bedrails?: A Lot Help needed moving to and from a bed to a chair (including a wheelchair)?: Total Help needed standing up from a chair using your arms (e.g., wheelchair or bedside chair)?: Total Help needed to walk in hospital room?: Total Help  needed climbing 3-5 steps with a railing? : Total 6 Click Score: 8    End of Session Equipment Utilized During Treatment: Gait belt Activity Tolerance: Patient tolerated treatment well Patient left: in bed;with call bell/phone within reach;with nursing/sitter in room;with bed alarm set Nurse Communication: Mobility status PT Visit Diagnosis: Other abnormalities of gait and mobility (R26.89);Difficulty in walking, not elsewhere classified (R26.2)    Time: [id number] PT Time Calculation (min) (ACUTE ONLY): 20 min   Charges:   PT Evaluation $PT Eval Moderate Complexity: 1 Mod          Reuel Derby, PT, DPT  Acute Rehabilitation Services  Pager: [phone] Office: [phone]   Rudean Hitt 06/09/2021, 12:43 PM
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
## 2021-06-09 NOTE — Progress Notes (Signed)
HD#2 SUBJECTIVE:  Patient Summary: [name] is a 14 yom with a hx of MS who presents to the emergency department via EMS with altered mental status  Overnight Events: no acute events overnight    Interm History:  Patient states he is feeling well. Feels back to normal. No complaints at this time.   OBJECTIVE:  Vital Signs: Vitals:   06/09/21 0343 06/09/21 0751 06/09/21 0828 06/09/21 1108  BP: 129/84 122/87 124/81 127/75  Pulse: (!) 53 62 (!) 55 67  Resp: 16 20 18 20   Temp: 98.3 F (36.8 C) 97.9 F (36.6 C) 98.3 F (36.8 C) 98.6 F (37 C)  TempSrc: Oral Oral Oral Oral  SpO2: 98% 99% 100% 100%  Weight:      Height:       SpO2: 100 %  Filed Weights   06/06/21 1358  Weight: 77.1 kg     Intake/Output Summary (Last 24 hours) at 06/09/2021 1438 Last data filed at 06/09/2021 1100 Gross per 24 hour  Intake 740 ml  Output 1325 ml  Net -585 ml   Net IO Since Admission: -1,751.67 mL [06/09/21 1438]  Physical Exam: Constitutional: Thin, chronically ill appearing male, in no acute distress HENT: normocephalic atraumatic, mucous membranes moist Eyes: conjunctiva non-erythematous Cardiovascular: regular rate and rhythm, no m/r/g Pulmonary/Chest: normal work of breathing on room air, lungs clear to auscultation bilaterally Abdominal: soft, non-tender, non-distended MSK: decreased bulk and tone bilateral lower extremities Neurological: AAOX3. Speech much improved, more fluent and coherent. Responding appropriately. Strength and sensation intact bilateral upper extremities. Sensation intact bilateral lower extremities, strength 2+/5 bilaterally Skin: warm and dry, poor turgor Psych: Broad affect  Patient Lines/Drains/Airways Status     Active Line/Drains/Airways     Name Placement date Placement time Site Days   Peripheral IV 06/06/21 20 G Anterior;Left Forearm 06/06/21  1346  Forearm  1   Peripheral IV 06/06/21 20 G Right Antecubital 06/06/21  1429  Antecubital   1   Urethral Catheter Gloria RN Straight-tip 16 Fr. 06/06/21  1524  Straight-tip  1            Pertinent Labs: CBC Latest Ref Rng & Units 06/09/2021 06/08/2021 06/07/2021  WBC 4.0 - 10.5 K/uL 6.6 8.4 7.1  Hemoglobin 13.0 - 17.0 g/dL 11.2(L) 11.0(L) 11.1(L)  Hematocrit 39.0 - 52.0 % 33.4(L) 33.1(L) 33.3(L)  Platelets 150 - 400 K/uL 190 200 201    CMP Latest Ref Rng & Units 06/09/2021 06/08/2021 06/07/2021  Glucose 70 - 99 mg/dL 93 104(H) 102(H)  BUN 6 - 20 mg/dL 25(H) 29(H) 36(H)  Creatinine 0.61 - 1.24 mg/dL 1.66(H) 1.70(H) 2.06(H)  Sodium 135 - 145 mmol/L 139 138 140  Potassium 3.5 - 5.1 mmol/L 4.0 3.8 4.0  Chloride 98 - 111 mmol/L 108 108 111  CO2 22 - 32 mmol/L 20(L) 20(L) 22  Calcium 8.9 - 10.3 mg/dL 9.7 9.4 9.4  Total Protein 6.5 - 8.1 g/dL - - -  Total Bilirubin 0.3 - 1.2 mg/dL - - -  Alkaline Phos 38 - 126 U/L - - -  AST 15 - 41 U/L - - -  ALT 0 - 44 U/L - - -    No results for input(s): GLUCAP in the last 72 hours.   Pertinent Imaging: No results found.  ASSESSMENT/PLAN:  Assessment: Principal Problem:   AMS (altered mental status) Active Problems:   AKI (acute kidney injury) ([place])   Toxic metabolic encephalopathy  #altered mental status - Patient appears  to be at his baseline level of mentation this AM. Though remains not oriented to time. Thought process is linear and able to converse appropriately. He states that he feels he will need additional help at home, currently he has an aide that comes to his home three times a day.  Etiology likely medication overdose in the setting of AKI due to dehydration. Resolved.  - baclofen has been restarted at low dose. Uptitrating every other day depending on how patient tolerates it.     # AKI ? UTI Hx BPH Cr and BUN appears to be improving with IV fluids.  Electrolytes have remained stable.  - PO fluids. Daily BMP  #MS Spastic paresis - uses electric wheel chair, spastic paresis. States he no longer walks much  at home, even with assistive devices. Patient's wheelchair was likely left at home when he was brought to the ED via EMS.  - Follows with neurology. On ocrevus - Discussed with neuro, okay to increased Baclofen to 2.5mg  4 times daily starting tomorrow. After that, can double dose every two days depending on how patient tolerates.  - after discussing with patient and social work, it was decided that patient will go to SNF. He is medically stable for discharge. DC tomorrow pending SNF   ? UTI - UA with bacteria and leukocytes. Given one dose CTX. He denies urinary symptoms. He has been afebrile with no leukocytosis. - urine culture is positive for E faecalis. Remains asymptomatic. Mental status has returned to baseline - will hold off on starting IV abx for now. F/u Ucx.    HTN Home meds lisinopril hctz Amlodipine increased to 10mg  2/11. His SBP in 110s to 130s.  -Continue current antihypertensive regimen.     HLD Lipitor 40mg    Depression - fluoxetine 40  Code: Full Prior to Admission Living Arrangement: Home, living self, will reach out to [facility] if patient qualifies for additional assistance at home.  Dispo: Admit patient to Inpatient with expected length of stay greater than 2 midnights.    Signature: Delene Ruffini, PGY1 Internal Medicine  Pager [phone]  2:38 PM, 06/09/2021   Please contact the on call pager after 5 pm and on weekends at [phone].

## 2021-06-09 NOTE — TOC Initial Note (Addendum)
Transition of Care [facility]) - Initial/Assessment Note    Patient Details  Name: [name] MRN: [id number] Date of Birth: [birth year]  Transition [facility] Care [facility]) CM/SW Contact:    Coralee Pesa, [place] Phone Number: 06/09/2021, 1:16 PM  Clinical Narrative:                 3:30 CSW spoke with son and pace Education officer, museum, plan is for pt to got to [facility] rehab if facility has a bed available in the AM. TOC will continue to follow for DC needs. Son is also under the impression that pt was brought in by EMS, so chair is likely at home. Pt's brother lives in [place], unable to leave a VM for him.  CSW spoke with Claudia Desanctis and they noted they will follow for SNF placement and they requested pt [facility] to [facility] for [facility]. [facility] SW is Ernestine Mcmurray [phone]) who would like a follow up call with any updates and if pt is accepted to [facility].  CSW spoke with pt at bedside who states he is agreeable to SNF and just wants to receive help. He states that his wheelchair is missing in the hospital. Pt came in with EMS, not likely the Wheelchair is here. CSW called son to follow up, VM was left. TOC will continue to follow for DC needs.  Expected Discharge Plan: Skilled Nursing Facility Barriers to Discharge: SNF Pending bed offer, Insurance Authorization, Continued Medical Work up   Patient Goals and CMS Choice Patient states their goals for this hospitalization and ongoing recovery are:: Pt states his goal is to "get some help" CMS Medicare.gov Compare Post Acute Care list provided to:: Patient Choice offered to / list presented to : Patient  Expected Discharge Plan and Services Expected Discharge Plan: [place] Choice: [place] Living arrangements for the past 2 months: Apartment                                      Prior Living Arrangements/Services Living arrangements for the past 2 months: Apartment Lives  with:: Self Patient language and need for interpreter reviewed:: Yes Do you feel safe going back to the place where you live?: Yes      Need for Family Participation in Patient Care: Yes (Comment) Care giver support system in place?: No (comment)   Criminal Activity/Legal Involvement Pertinent to Current Situation/Hospitalization: No - Comment as needed  Activities of Daily Living      Permission Sought/Granted Permission sought to share information with : Family Supports Permission granted to share information with : Yes, Verbal Permission Granted  Share Information with NAME: Elberta Fortis "Alphonzo [place]" Rafiq     Permission granted to share info w Relationship: Son  Permission granted to share info w Contact Information: [phone]  Emotional Assessment Appearance:: Appears stated age Attitude/Demeanor/Rapport: Engaged Affect (typically observed): Frustrated Orientation: : Oriented to Self, Oriented to Place, Oriented to Situation Alcohol / Substance Use: Not Applicable Psych Involvement: No (comment)  Admission diagnosis:  AKI (acute kidney injury) ([place]) [N17.9] Urinary tract infection without hematuria, site unspecified [N39.0] Altered mental status, unspecified altered mental status type [R41.82] AMS (altered mental status) [R41.82] Patient Active Problem List   Diagnosis Date Noted   AKI (acute kidney injury) ([employer])    Toxic metabolic encephalopathy    AMS (altered  mental status) 06/06/2021   Multiple sclerosis ([place]) 09/25/2020   Displaced fracture of lateral end of right clavicle, initial encounter for closed fracture 08/30/2017   Multiple sclerosis, 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

## 2021-06-10 DIAGNOSIS — N179 Acute kidney failure, unspecified: Secondary | ICD-10-CM

## 2021-06-10 DIAGNOSIS — G929 Unspecified toxic encephalopathy: Secondary | ICD-10-CM

## 2021-06-10 LAB — BASIC METABOLIC PANEL
Anion gap: 8 (ref 5–15)
BUN: 19 mg/dL (ref 6–20)
CO2: 27 mmol/L (ref 22–32)
Calcium: 9.8 mg/dL (ref 8.9–10.3)
Chloride: 104 mmol/L (ref 98–111)
Creatinine, Ser: 1.62 mg/dL — ABNORMAL HIGH (ref 0.61–1.24)
GFR, Estimated: 49 mL/min — ABNORMAL LOW (ref >60)
Glucose, Bld: 96 mg/dL (ref 70–99)
Potassium: 4.1 mmol/L (ref 3.5–5.1)
Sodium: 139 mmol/L (ref 135–145)

## 2021-06-10 LAB — MAGNESIUM: Magnesium: 2 mg/dL (ref 1.7–2.4)

## 2021-06-10 LAB — IRON AND TIBC
Iron: 57 ug/dL (ref 45–182)
Saturation Ratios: 27 % (ref 17.9–39.5)
TIBC: 211 ug/dL — ABNORMAL LOW (ref 250–450)
UIBC: 154 ug/dL

## 2021-06-10 LAB — FERRITIN: Ferritin: 332 ng/mL (ref 24–336)

## 2021-06-10 LAB — VITAMIN B12: Vitamin B-12: 328 pg/mL (ref 180–914)

## 2021-06-10 MED ORDER — FOLIC ACID 1 MG PO TABS: 1.0000 mg | ORAL_TABLET | Freq: Every day | ORAL | Status: AC

## 2021-06-10 MED ORDER — ADULT MULTIVITAMIN W/MINERALS CH: 1.0000 | ORAL_TABLET | Freq: Every day | ORAL | Status: AC

## 2021-06-10 MED ORDER — ACETAMINOPHEN 325 MG PO TABS: 650.0000 mg | ORAL_TABLET | Freq: Four times a day (QID) | ORAL | Status: AC | PRN

## 2021-06-10 MED ORDER — AMLODIPINE BESYLATE 10 MG PO TABS: 10.0000 mg | ORAL_TABLET | Freq: Every day | ORAL | Status: DC

## 2021-06-10 MED ORDER — SENNOSIDES-DOCUSATE SODIUM 8.6-50 MG PO TABS: 1.0000 | ORAL_TABLET | Freq: Every day | ORAL | Status: DC | PRN

## 2021-06-10 MED ORDER — BACLOFEN 5 MG PO TABS: 5.0000 mg | ORAL_TABLET | Freq: Two times a day (BID) | ORAL | 0 refills | Status: DC

## 2021-06-10 NOTE — Plan of Care (Signed)
°  Problem: Education: Goal: Knowledge of General Education information will improve Description: Including pain rating scale, medication(s)/side effects and non-pharmacologic comfort measures 06/10/2021 1437 by Coralie Common, RN Outcome: Adequate for Discharge 06/10/2021 1237 by Coralie Common, RN Outcome: Progressing   Problem: Clinical Measurements: Goal: Ability to maintain clinical measurements within normal limits will improve 06/10/2021 1437 by Coralie Common, RN Outcome: Adequate for Discharge 06/10/2021 1237 by Coralie Common, RN Outcome: Progressing Goal: Will remain free from infection 06/10/2021 1437 by Coralie Common, RN Outcome: Adequate for Discharge 06/10/2021 1237 by Coralie Common, RN Outcome: Progressing Goal: Diagnostic test results will improve 06/10/2021 1437 by Coralie Common, RN Outcome: Adequate for Discharge 06/10/2021 1237 by Coralie Common, RN Outcome: Progressing Goal: Respiratory complications will improve 06/10/2021 1437 by Coralie Common, RN Outcome: Adequate for Discharge 06/10/2021 1237 by Coralie Common, RN Outcome: Progressing Goal: Cardiovascular complication will be avoided 06/10/2021 1437 by Coralie Common, RN Outcome: Adequate for Discharge 06/10/2021 1237 by Coralie Common, RN Outcome: Progressing   Problem: Health Behavior/Discharge Planning: Goal: Ability to manage health-related needs will improve 06/10/2021 1437 by Coralie Common, RN Outcome: Adequate for Discharge 06/10/2021 1237 by Coralie Common, RN Outcome: Progressing   Problem: Coping: Goal: Level of anxiety will decrease 06/10/2021 1437 by Coralie Common, RN Outcome: Adequate for Discharge 06/10/2021 1237 by Coralie Common, RN Outcome: Progressing   Problem: Elimination: Goal: Will not experience complications related to bowel motility 06/10/2021 1437 by Coralie Common, RN Outcome: Adequate for Discharge 06/10/2021 1237 by Coralie Common,  RN Outcome: Progressing Goal: Will not experience complications related to urinary retention 06/10/2021 1437 by Coralie Common, RN Outcome: Adequate for Discharge 06/10/2021 1237 by Coralie Common, RN Outcome: Progressing   Problem: Nutrition: Goal: Adequate nutrition will be maintained 06/10/2021 1437 by Coralie Common, RN Outcome: Adequate for Discharge 06/10/2021 1237 by Coralie Common, RN Outcome: Progressing   Problem: Skin Integrity: Goal: Risk for impaired skin integrity will decrease 06/10/2021 1437 by Coralie Common, RN Outcome: Adequate for Discharge 06/10/2021 1237 by Coralie Common, RN Outcome: Progressing   Problem: Safety: Goal: Ability to remain free from injury will improve 06/10/2021 1437 by Coralie Common, RN Outcome: Adequate for Discharge 06/10/2021 1237 by Coralie Common, RN Outcome: Progressing

## 2021-06-10 NOTE — Discharge Instructions (Signed)
Mr. Rushing it was a pleasure taking care of you.  For your muscle relaxant, baclofen, we have reduced this dose as we believe this is what contributed to your confusion when he came to the hospital.  You have follow-up with your pace doctor this week and we are leaving it up to their discretion to increase this medication.  You also had some damage to your kidneys that was likely due to you not eating and drinking enough as well as your blood pressure medications.  We stopped your lisinopril/hydrochlorothiazide medication and started you on a medication called amlodipine for your blood pressure.  You will need to get labs to check your kidney function and your medications can be changed per your pace doctor.

## 2021-06-10 NOTE — Care Management Important Message (Signed)
Important Message  Patient Details  Name: [name] MRN: [id number] Date of Birth: 10-26-1965   Medicare Important Message Given:  Yes     Dorena Bodo 06/10/2021, 2:29 PM

## 2021-06-10 NOTE — Plan of Care (Signed)
°  Problem: Education: °Goal: Knowledge of General Education information will improve °Description: Including pain rating scale, medication(s)/side effects and non-pharmacologic comfort measures °Outcome: Progressing °  °Problem: Health Behavior/Discharge Planning: °Goal: Ability to manage health-related needs will improve °Outcome: Progressing °  °Problem: Clinical Measurements: °Goal: Ability to maintain clinical measurements within normal limits will improve °Outcome: Progressing °Goal: Will remain free from infection °Outcome: Progressing °Goal: Diagnostic test results will improve °Outcome: Progressing °Goal: Respiratory complications will improve °Outcome: Progressing °Goal: Cardiovascular complication will be avoided °Outcome: Progressing °  °Problem: Safety: °Goal: Ability to remain free from injury will improve °Outcome: Progressing °  °Problem: Skin Integrity: °Goal: Risk for impaired skin integrity will decrease °Outcome: Progressing °  °

## 2021-06-10 NOTE — TOC Transition Note (Addendum)
Transition of Care [facility]) - CM/SW Discharge Note   Patient Details  Name: [name] MRN: [id number] Date of Birth: 03/17/1966  [facility]) CM/SW Contact:  Carley Hammed, LCSWA Phone Number: 06/10/2021, 12:40 PM   Clinical Narrative:     Pt is agreeable to receiving rehab services at [facility].  Call to report # [phone]. [facility] will provide transportation at 3pm.  Final next level of care: Skilled Nursing Facility Barriers to Discharge: SNF Pending discharge summary, SNF Pending discharge orders   Patient Goals and CMS Choice Patient states their goals for this hospitalization and ongoing recovery are:: Pt states his goal is to "get some help" CMS Medicare.gov Compare Post Acute Care list provided to:: Patient Choice offered to / list presented to : Patient  Discharge Placement              Patient chooses bed at: [facility] Patient to be transferred to facility by: [facility] Name of family member notified: Molli Barrows Patient and family notified of of transfer: 06/10/21  Discharge Plan and Services     Post Acute Care Choice: Skilled Nursing Facility                               Social Determinants of Health (SDOH) Interventions     Readmission Risk Interventions No flowsheet data found.

## 2021-06-10 NOTE — Care Management Important Message (Signed)
Important Message  Patient Details  Name: [name] MRN: [id number] Date of Birth: 1965/09/01   Medicare Important Message Given:        Orbie Pyo 06/10/2021, 1:44 PM

## 2021-06-10 NOTE — Progress Notes (Signed)
Report called to [employer] and given to [place], [facility] transported patient.

## 2021-06-11 LAB — CULTURE, BLOOD (ROUTINE X 2)
Culture: NO GROWTH
Culture: NO GROWTH

## 2021-06-22 ENCOUNTER — Emergency Department (HOSPITAL_COMMUNITY): Payer: Medicaid Other

## 2021-06-22 ENCOUNTER — Observation Stay (HOSPITAL_COMMUNITY)
Admission: EM | Admit: 2021-06-22 | Discharge: 2021-06-24 | Disposition: A | Discharge status: Home Health | Payer: Medicaid Other | Attending: Internal Medicine | Admitting: Internal Medicine

## 2021-06-22 DIAGNOSIS — R400 Somnolence: Secondary | ICD-10-CM

## 2021-06-22 DIAGNOSIS — N179 Acute kidney failure, unspecified: Secondary | ICD-10-CM | POA: Diagnosis not present

## 2021-06-22 DIAGNOSIS — G9340 Encephalopathy, unspecified: Secondary | ICD-10-CM | POA: Diagnosis not present

## 2021-06-22 DIAGNOSIS — J9811 Atelectasis: Secondary | ICD-10-CM | POA: Insufficient documentation

## 2021-06-22 DIAGNOSIS — K5900 Constipation, unspecified: Secondary | ICD-10-CM | POA: Insufficient documentation

## 2021-06-22 DIAGNOSIS — T50901A Poisoning by unspecified drugs, medicaments and biological substances, accidental (unintentional), initial encounter: Secondary | ICD-10-CM | POA: Diagnosis present

## 2021-06-22 DIAGNOSIS — Z20822 Contact with and (suspected) exposure to covid-19: Secondary | ICD-10-CM | POA: Insufficient documentation

## 2021-06-22 DIAGNOSIS — G35 Multiple sclerosis: Secondary | ICD-10-CM | POA: Diagnosis not present

## 2021-06-22 DIAGNOSIS — Z7901 Long term (current) use of anticoagulants: Secondary | ICD-10-CM | POA: Insufficient documentation

## 2021-06-22 DIAGNOSIS — I10 Essential (primary) hypertension: Secondary | ICD-10-CM | POA: Diagnosis not present

## 2021-06-22 DIAGNOSIS — Z79899 Other long term (current) drug therapy: Secondary | ICD-10-CM | POA: Diagnosis not present

## 2021-06-22 DIAGNOSIS — N320 Bladder-neck obstruction: Secondary | ICD-10-CM | POA: Diagnosis not present

## 2021-06-22 DIAGNOSIS — E785 Hyperlipidemia, unspecified: Secondary | ICD-10-CM | POA: Diagnosis not present

## 2021-06-22 DIAGNOSIS — Z7982 Long term (current) use of aspirin: Secondary | ICD-10-CM | POA: Insufficient documentation

## 2021-06-22 MED ORDER — SODIUM CHLORIDE 0.9 % IV SOLN: INTRAVENOUS | Status: DC

## 2021-06-22 NOTE — ED Triage Notes (Signed)
Pt BIB EMS, reported similar symptoms to prior overdose on trazodone. Per report pt has been sedated appearing since ~1400. No incontinence noted on this encounter.

## 2021-06-22 NOTE — ED Provider Notes (Signed)
[facility] EMERGENCY DEPARTMENT Provider Note  CSN: [id number] Arrival date & time: 06/22/21 2218  Chief Complaint(s) Drug Overdose ED Triage Notes Ronald Pippins, RN (Registered Nurse)   Emergency Medicine   Date of Service: 06/22/2021 10:19 PM   Signed   Pt BIB EMS, reported similar symptoms to prior overdose on trazodone. Per report pt has been sedated appearing since ~1400. No incontinence noted on this encounter.     HPI [name] is a 56 y.o. 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  Patient was brought by EMS.   Patient is sleepy but arousable to verbal stimuli.  Oriented to self only.  Remainder of history, ROS, and physical exam limited due to patient's condition (altered mental status). Additional information was obtained from EMS.   Level V Caveat.    The history is provided by the EMS personnel.   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   Patient Active Problem List   Diagnosis Date Noted   Encephalopathy acute 06/23/2021   AKI (acute kidney injury) (HCC)    Toxic metabolic encephalopathy    AMS (altered mental status) 06/06/2021   Multiple sclerosis (HCC) 09/25/2020   Displaced fracture of lateral end of right clavicle, initial encounter for closed fracture 08/30/2017   Multiple sclerosis, 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 Authorizing Provider  acetaminophen (TYLENOL) 325 MG tablet Take 650 mg by mouth every 8 (eight) hours as needed for moderate pain.    [provider]  acetaminophen (TYLENOL) 325 MG tablet Take 2 tablets (650 mg total) by mouth every 6 (six) hours as needed for mild pain (or Fever >/= 101). 06/10/21   Marolyn Haller, MD  amLODipine (NORVASC) 10 MG tablet Take 1 tablet (10 mg total) by mouth daily. 06/11/21   Marolyn Haller, MD  aspirin EC 81 MG tablet Take 81 mg by mouth daily.    [provider]  atorvastatin (LIPITOR) 40 MG tablet Take 40 mg by mouth daily.    [provider]  baclofen 5 MG TABS Take 5 mg by mouth 2 (two) times daily. 06/10/21   Marolyn Haller, MD  Cholecalciferol (VITAMIN D3 PO) Take 25 mcg by mouth daily.    [provider]  FLUoxetine (PROZAC) 40 MG capsule Take 40 mg by mouth every morning.    [provider]  folic acid (FOLVITE) 1 MG tablet Take 1 tablet (1 mg total) by mouth daily. 06/11/21   Marolyn Haller, MD  gabapentin (NEURONTIN) 300 MG capsule Take 1 capsule (300 mg total) by mouth 2 (two) times daily. 03/07/20   Glean Salvo, NP  Multiple Vitamin (MULTIVITAMIN WITH MINERALS) TABS tablet Take 1 tablet by mouth daily. 06/11/21  Marolyn Haller, MD  ocrelizumab 600 mg in sodium chloride 0.9 % 500 mL Inject 600 mg into the vein every 6 (six) months.    [provider]  senna-docusate (SENOKOT-S) 8.6-50 MG tablet Take 1 tablet by mouth daily as needed for mild constipation. 06/10/21   Marolyn Haller, MD  sildenafil (REVATIO) 20 MG tablet Take 20 mg by mouth daily as needed (erectile dysfunction).    [provider]                                                                                                                                     Allergies Morphine and related and Morphine and related  Review of Systems Review of Systems As noted in HPI  Physical Exam Vital Signs  I have reviewed the triage vital signs BP 106/70    Pulse 71    Temp 97.8 F (36.6 C) (Oral)    Resp 17    SpO2 99%   Physical Exam Vitals reviewed.  Constitutional:      General: He is not in acute distress.    Appearance: He is well-developed. He is not diaphoretic.  HENT:     Head: Normocephalic and atraumatic.     Nose: Nose normal.  Eyes:     General: No scleral icterus.       Right eye: No discharge.        Left eye: No discharge.     Conjunctiva/sclera: Conjunctivae normal.     Pupils: Pupils are equal, round, and reactive to light.  Cardiovascular:     Rate and Rhythm: Normal rate and regular rhythm.     Heart sounds: No murmur heard.   No friction rub. No gallop.  Pulmonary:     Effort: Pulmonary effort is normal. No respiratory distress.     Breath sounds: Normal breath sounds. No stridor. No rales.  Abdominal:     General: There is no distension.     Palpations: Abdomen is soft.     Tenderness: There is no abdominal tenderness.  Musculoskeletal:        General: No tenderness.     Cervical back: Normal range of motion and neck supple.  Skin:    General: Skin is warm and dry.     Findings: No erythema or rash.  Neurological:     Mental Status: He is lethargic and disoriented.     Comments: Awakens to verbal stimuli, but quickly falls asleep When awake he is oriented to self only. Does not know the day, month, year, or president. Moves all extremities with limited strength ~ 2/5 throughout.    ED Results and Treatments Labs (all labs ordered are listed, but only abnormal results are displayed) Labs Reviewed  COMPREHENSIVE METABOLIC PANEL - Abnormal; Notable for the following components:      Result Value   CO2 20 (*)    Glucose, Bld 111 (*)  BUN 38 (*)     Creatinine, Ser 3.21 (*)    GFR, Estimated 22 (*)    All other components within normal limits  SALICYLATE LEVEL - Abnormal; Notable for the following components:   Salicylate Lvl <7.0 (*)    All other components within normal limits  ACETAMINOPHEN LEVEL - Abnormal; Notable for the following components:   Acetaminophen (Tylenol), Serum <10 (*)    All other components within normal limits  CBC WITH DIFFERENTIAL/PLATELET - Abnormal; Notable for the following components:   RBC 3.67 (*)    Hemoglobin 11.2 (*)    HCT 35.0 (*)    All other components within normal limits  I-STAT CHEM 8, ED - Abnormal; Notable for the following components:   BUN 42 (*)    Creatinine, Ser 3.50 (*)    Glucose, Bld 108 (*)    Calcium, Ion 1.13 (*)    Hemoglobin 11.9 (*)    HCT 35.0 (*)    All other components within normal limits  I-STAT VENOUS BLOOD GAS, ED - Abnormal; Notable for the following components:   pH, Ven 7.478 (*)    pCO2, Ven 30.7 (*)    pO2, Ven 119 (*)    Calcium, Ion 1.14 (*)    HCT 34.0 (*)    Hemoglobin 11.6 (*)    All other components within normal limits  ETHANOL  AMMONIA  CK  PROTIME-INR  LACTIC ACID, PLASMA  CBC WITH DIFFERENTIAL/PLATELET  URINALYSIS, ROUTINE W REFLEX MICROSCOPIC  RAPID URINE DRUG SCREEN, HOSP PERFORMED  PHOSPHORUS  MAGNESIUM  COMPREHENSIVE METABOLIC PANEL  CBC  CBG MONITORING, ED  CBG MONITORING, ED                                                                                                                         EKG  EKG Interpretation  Date/Time:  Sunday June 22 2021 22:37:09 EST Ventricular Rate:  77 PR Interval:  165 QRS Duration: 128 QT Interval:  384 QTC Calculation: 435 R Axis:   -35 Text Interpretation: Sinus rhythm Probable left atrial enlargement Left ventricular hypertrophy Anterior Q waves, possibly due to LVH Minimal ST elevation, lateral leads No acute changes Confirmed by Drema Pry [id number]) on 06/23/2021 1:14:38 AM        Radiology CT Head Wo Contrast  Result Date: 06/23/2021 CLINICAL DATA:  Altered mental status EXAM: CT HEAD WITHOUT CONTRAST TECHNIQUE: Contiguous axial images were obtained from the base of the skull through the vertex without intravenous contrast. RADIATION DOSE REDUCTION: This exam was performed according to the departmental dose-optimization program which includes automated exposure control, adjustment of the mA and/or kV according to patient size and/or use of iterative reconstruction technique. COMPARISON:  06/06/2021 FINDINGS: Brain: Normal anatomic configuration. Parenchymal volume loss is commensurate with the patient's age. Moderate subcortical and periventricular white matter changes are present likely reflecting the sequela of small vessel ischemia, stable since prior examination. No abnormal intra or extra-axial mass lesion or fluid collection.  No abnormal mass effect or midline shift. No evidence of acute intracranial hemorrhage or infarct. Ventricular size is normal. Cerebellum unremarkable. Vascular: No asymmetric hyperdense vasculature at the skull base. Skull: Intact Sinuses/Orbits: Paranasal sinuses are clear. Orbits are unremarkable. Other: Mastoid air cells and middle ear cavities are clear. IMPRESSION: Stable senescent change.  No acute intracranial abnormality. Electronically Signed   By: Helyn Numbers M.D.   On: 06/23/2021 00:00   DG Chest Portable 1 View  Result Date: 06/22/2021 CLINICAL DATA:  Altered mental status EXAM: PORTABLE CHEST 1 VIEW COMPARISON:  06/06/2021 FINDINGS: Mild elevation of the left hemidiaphragm. Left base atelectasis. Right lung clear. No effusions. Heart is normal size. No acute bony abnormality. IMPRESSION: Mild elevation of the left hemidiaphragm with left base atelectasis. Electronically Signed   By: Charlett Nose M.D.   On: 06/22/2021 23:18    Pertinent labs & imaging results that were available during my care of the patient were reviewed by me and  considered in my medical decision making (see MDM for details).  Medications Ordered in ED Medications  0.9 %  sodium chloride infusion (has no administration in time range)  enoxaparin (LOVENOX) injection 30 mg (has no administration in time range)  lactated ringers infusion (has no administration in time range)  acetaminophen (TYLENOL) tablet 650 mg (has no administration in time range)    Or  acetaminophen (TYLENOL) suppository 650 mg (has no administration in time range)  polyethylene glycol (MIRALAX / GLYCOLAX) packet 17 g (has no administration in time range)  senna-docusate (Senokot-S) tablet 1 tablet (has no administration in time range)  aspirin EC tablet 81 mg (has no administration in time range)  atorvastatin (LIPITOR) tablet 40 mg (has no administration in time range)  baclofen (LIORESAL) tablet 5 mg (has no administration in time range)  FLUoxetine (PROZAC) capsule 40 mg (has no administration in time range)  amLODipine (NORVASC) tablet 10 mg (has no administration in time range)  sodium chloride 0.9 % bolus 1,000 mL (1,000 mLs Intravenous New Bag/Given 06/23/21 0207)                                                                                                                                     Procedures .1-3 Lead EKG Interpretation Performed by: Nira Conn, MD Authorized by: Nira Conn, MD     Interpretation: normal     ECG rate:  78   ECG rate assessment: normal     Rhythm: sinus rhythm     Ectopy: none     Conduction: normal    (including critical care time)  Medical Decision Making / ED Course        Somnolence. On review of records, patient was admitted earlier this month for similar presentation.  He is on several sedating medications and believed that his increased somnolence was due to polypharmacy and decreased p.o. intake resulting in AKI.  Prior to  being discharged the patient's mental status has significantly improved.  It is  noted on progress note from February 13 that the patient is alert and oriented x3. Tried calling family but no initial answer. I was called back by the son at 12a, who mentioned that he himself(the son) lives in [place].  He reports that the patient was just sent home from skilled nursing facility 2 days ago after being discharged from the hospital.  Reports that the patient lives by himself but has an aide that comes by.  He is not sure how often but he knows is not every day.Marland Kitchen  He last spoke to the patient yesterday and reports that he seemed at his normal state of health.  He reports that he he was told previously that his altered mental status was related to urinary tract infection.  We will obtain screening labs, urine, and get CT head to assess for any evidence of ICH, infection, metabolic derangements.  Also considering polypharmacy given his history of sedating medication.   Work-up ordered to assess concerns above.  Labs and imaging independently interpreted by me and noted below: CT head negative for ICH or mass effect. CBC without leukocytosis or significant anemia. No significant electrolyte derangement. Patient does have AKI with a creatinine of 3.2, up from 1.6 during previous admission. Alcohol and ammonia levels negative  no evidence of hypercarbia CXR w/o pneumonia  Management: IVF monitor  Reassessment: After several hours, patient mental status improved.  Patient is more responsive and coherent.  Patient able to tell me the day, but still not able to tell me the month, year, or president.  Patient does tell me that he takes his medicines as prescribed stating that he receives them in packets and takes them in the morning and at night.  No evidence of infectious process but still waiting on UA.  Patient does have greater than 400 cc in the bladder on bladder scan.  Foley placed.   Somnolence is likely related to polypharmacy.  Given the AKI, may be prerenal from oversedation  and lack of oral hydration versus postrenal.  Patient requires admission for further work-up and management.  Spoke with internal medicine who agreed to admit patient.    Final Clinical Impression(s) / ED Diagnoses Final diagnoses:  AKI (acute kidney injury) (HCC)  Somnolence           This chart was dictated using voice recognition software.  Despite best efforts to proofread,  errors can occur which can change the documentation meaning.    Nira Conn, MD 06/23/21 [phone]

## 2021-06-23 ENCOUNTER — Observation Stay (HOSPITAL_COMMUNITY): Payer: Medicaid Other

## 2021-06-23 DIAGNOSIS — G9340 Encephalopathy, unspecified: Secondary | ICD-10-CM

## 2021-06-23 DIAGNOSIS — N320 Bladder-neck obstruction: Secondary | ICD-10-CM | POA: Diagnosis not present

## 2021-06-23 DIAGNOSIS — N179 Acute kidney failure, unspecified: Secondary | ICD-10-CM | POA: Diagnosis not present

## 2021-06-23 LAB — I-STAT VENOUS BLOOD GAS, ED
Acid-Base Excess: 0 mmol/L (ref 0.0–2.0)
Bicarbonate: 22.7 mmol/L (ref 20.0–28.0)
Calcium, Ion: 1.14 mmol/L — ABNORMAL LOW (ref 1.15–1.40)
HCT: 34 % — ABNORMAL LOW (ref 39.0–52.0)
Hemoglobin: 11.6 g/dL — ABNORMAL LOW (ref 13.0–17.0)
O2 Saturation: 99 %
Potassium: 4.6 mmol/L (ref 3.5–5.1)
Sodium: 138 mmol/L (ref 135–145)
TCO2: 24 mmol/L (ref 22–32)
pCO2, Ven: 30.7 mmHg — ABNORMAL LOW (ref 44–60)
pH, Ven: 7.478 — ABNORMAL HIGH (ref 7.25–7.43)
pO2, Ven: 119 mmHg — ABNORMAL HIGH (ref 32–45)

## 2021-06-23 LAB — CBC WITH DIFFERENTIAL/PLATELET
Abs Immature Granulocytes: 0.02 10*3/uL (ref 0.00–0.07)
Basophils Absolute: 0.1 10*3/uL (ref 0.0–0.1)
Basophils Relative: 1 %
Eosinophils Absolute: 0 10*3/uL (ref 0.0–0.5)
Eosinophils Relative: 0 %
HCT: 35 % — ABNORMAL LOW (ref 39.0–52.0)
Hemoglobin: 11.2 g/dL — ABNORMAL LOW (ref 13.0–17.0)
Immature Granulocytes: 0 %
Lymphocytes Relative: 19 %
Lymphs Abs: 1.3 10*3/uL (ref 0.7–4.0)
MCH: 30.5 pg (ref 26.0–34.0)
MCHC: 32 g/dL (ref 30.0–36.0)
MCV: 95.4 fL (ref 80.0–100.0)
Monocytes Absolute: 0.4 10*3/uL (ref 0.1–1.0)
Monocytes Relative: 5 %
Neutro Abs: 5.3 10*3/uL (ref 1.7–7.7)
Neutrophils Relative %: 75 %
Platelets: 301 10*3/uL (ref 150–400)
RBC: 3.67 MIL/uL — ABNORMAL LOW (ref 4.22–5.81)
RDW: 13 % (ref 11.5–15.5)
WBC: 7 10*3/uL (ref 4.0–10.5)
nRBC: 0 % (ref 0.0–0.2)

## 2021-06-23 LAB — CBC
HCT: 35.3 % — ABNORMAL LOW (ref 39.0–52.0)
Hemoglobin: 11 g/dL — ABNORMAL LOW (ref 13.0–17.0)
MCH: 30.6 pg (ref 26.0–34.0)
MCHC: 31.2 g/dL (ref 30.0–36.0)
MCV: 98.1 fL (ref 80.0–100.0)
Platelets: 291 10*3/uL (ref 150–400)
RBC: 3.6 MIL/uL — ABNORMAL LOW (ref 4.22–5.81)
RDW: 12.9 % (ref 11.5–15.5)
WBC: 7.3 10*3/uL (ref 4.0–10.5)
nRBC: 0 % (ref 0.0–0.2)

## 2021-06-23 LAB — URINALYSIS, ROUTINE W REFLEX MICROSCOPIC
Bilirubin Urine: NEGATIVE
Glucose, UA: NEGATIVE mg/dL
Hgb urine dipstick: NEGATIVE
Ketones, ur: NEGATIVE mg/dL
Leukocytes,Ua: NEGATIVE
Nitrite: NEGATIVE
Protein, ur: NEGATIVE mg/dL
Specific Gravity, Urine: 1.017 (ref 1.005–1.030)
pH: 5 (ref 5.0–8.0)

## 2021-06-23 LAB — RAPID URINE DRUG SCREEN, HOSP PERFORMED
Amphetamines: NOT DETECTED
Barbiturates: NOT DETECTED
Benzodiazepines: NOT DETECTED
Cocaine: NOT DETECTED
Opiates: NOT DETECTED
Tetrahydrocannabinol: POSITIVE — AB

## 2021-06-23 LAB — I-STAT CHEM 8, ED
BUN: 42 mg/dL — ABNORMAL HIGH (ref 6–20)
Calcium, Ion: 1.13 mmol/L — ABNORMAL LOW (ref 1.15–1.40)
Chloride: 108 mmol/L (ref 98–111)
Creatinine, Ser: 3.5 mg/dL — ABNORMAL HIGH (ref 0.61–1.24)
Glucose, Bld: 108 mg/dL — ABNORMAL HIGH (ref 70–99)
HCT: 35 % — ABNORMAL LOW (ref 39.0–52.0)
Hemoglobin: 11.9 g/dL — ABNORMAL LOW (ref 13.0–17.0)
Potassium: 4.6 mmol/L (ref 3.5–5.1)
Sodium: 138 mmol/L (ref 135–145)
TCO2: 23 mmol/L (ref 22–32)

## 2021-06-23 LAB — COMPREHENSIVE METABOLIC PANEL
ALT: 21 U/L (ref 0–44)
ALT: 17 U/L (ref 0–44)
AST: 18 U/L (ref 15–41)
AST: 18 U/L (ref 15–41)
Albumin: 4.2 g/dL (ref 3.5–5.0)
Albumin: 3.7 g/dL (ref 3.5–5.0)
Alkaline Phosphatase: 45 U/L (ref 38–126)
Alkaline Phosphatase: 40 U/L (ref 38–126)
Anion gap: 13 (ref 5–15)
Anion gap: 9 (ref 5–15)
BUN: 38 mg/dL — ABNORMAL HIGH (ref 6–20)
BUN: 36 mg/dL — ABNORMAL HIGH (ref 6–20)
CO2: 20 mmol/L — ABNORMAL LOW (ref 22–32)
CO2: 21 mmol/L — ABNORMAL LOW (ref 22–32)
Calcium: 9.8 mg/dL (ref 8.9–10.3)
Calcium: 9.2 mg/dL (ref 8.9–10.3)
Chloride: 106 mmol/L (ref 98–111)
Chloride: 111 mmol/L (ref 98–111)
Creatinine, Ser: 3.21 mg/dL — ABNORMAL HIGH (ref 0.61–1.24)
Creatinine, Ser: 2.63 mg/dL — ABNORMAL HIGH (ref 0.61–1.24)
GFR, Estimated: 22 mL/min — ABNORMAL LOW (ref >60)
GFR, Estimated: 28 mL/min — ABNORMAL LOW (ref >60)
Glucose, Bld: 111 mg/dL — ABNORMAL HIGH (ref 70–99)
Glucose, Bld: 100 mg/dL — ABNORMAL HIGH (ref 70–99)
Potassium: 4.7 mmol/L (ref 3.5–5.1)
Potassium: 4.4 mmol/L (ref 3.5–5.1)
Sodium: 139 mmol/L (ref 135–145)
Sodium: 141 mmol/L (ref 135–145)
Total Bilirubin: 0.5 mg/dL (ref 0.3–1.2)
Total Bilirubin: 0.7 mg/dL (ref 0.3–1.2)
Total Protein: 7.1 g/dL (ref 6.5–8.1)
Total Protein: 6.4 g/dL — ABNORMAL LOW (ref 6.5–8.1)

## 2021-06-23 LAB — RESP PANEL BY RT-PCR (FLU A&B, COVID) ARPGX2
Influenza A by PCR: NEGATIVE
Influenza B by PCR: NEGATIVE
SARS Coronavirus 2 by RT PCR: NEGATIVE

## 2021-06-23 LAB — PHOSPHORUS: Phosphorus: 4.1 mg/dL (ref 2.5–4.6)

## 2021-06-23 LAB — MAGNESIUM: Magnesium: 2.1 mg/dL (ref 1.7–2.4)

## 2021-06-23 LAB — AMMONIA: Ammonia: 34 umol/L (ref 9–35)

## 2021-06-23 LAB — LACTIC ACID, PLASMA: Lactic Acid, Venous: 0.9 mmol/L (ref 0.5–1.9)

## 2021-06-23 LAB — PROTIME-INR
INR: 1.1 (ref 0.8–1.2)
Prothrombin Time: 14.3 seconds (ref 11.4–15.2)

## 2021-06-23 LAB — CK: Total CK: 173 U/L (ref 49–397)

## 2021-06-23 LAB — ACETAMINOPHEN LEVEL: Acetaminophen (Tylenol), Serum: <10 ug/mL — ABNORMAL LOW (ref 10–30)

## 2021-06-23 LAB — SALICYLATE LEVEL: Salicylate Lvl: <7 mg/dL — ABNORMAL LOW (ref 7.0–30.0)

## 2021-06-23 LAB — ETHANOL: Alcohol, Ethyl (B): <10 mg/dL (ref <10)

## 2021-06-23 LAB — CBG MONITORING, ED: Glucose-Capillary: 80 mg/dL (ref 70–99)

## 2021-06-23 MED ORDER — ATORVASTATIN CALCIUM 40 MG PO TABS
40.0000 mg | ORAL_TABLET | Freq: Every day | ORAL | Status: DC
  Administered 2021-06-23 – 2021-06-24 (×2): 40 mg via ORAL
  Filled 2021-06-23 (×2): qty 1

## 2021-06-23 MED ORDER — ACETAMINOPHEN 650 MG RE SUPP: 650.0000 mg | Freq: Four times a day (QID) | RECTAL | Status: DC | PRN

## 2021-06-23 MED ORDER — TAMSULOSIN HCL 0.4 MG PO CAPS: 0.4000 mg | ORAL_CAPSULE | Freq: Every day | ORAL | Status: DC

## 2021-06-23 MED ORDER — ASPIRIN EC 81 MG PO TBEC
81.0000 mg | DELAYED_RELEASE_TABLET | Freq: Every day | ORAL | Status: DC
Start: 2021-06-23 — End: 2021-06-24
  Administered 2021-06-23 – 2021-06-24 (×2): 81 mg via ORAL
  Filled 2021-06-23 (×2): qty 1

## 2021-06-23 MED ORDER — AMLODIPINE BESYLATE 10 MG PO TABS
10.0000 mg | ORAL_TABLET | Freq: Every day | ORAL | Status: DC
  Administered 2021-06-23 – 2021-06-24 (×2): 10 mg via ORAL
  Filled 2021-06-23: qty 2
  Filled 2021-06-23: qty 1

## 2021-06-23 MED ORDER — SENNOSIDES-DOCUSATE SODIUM 8.6-50 MG PO TABS: 1.0000 | ORAL_TABLET | Freq: Every day | ORAL | Status: DC

## 2021-06-23 MED ORDER — SODIUM CHLORIDE 0.9 % IV BOLUS (SEPSIS)
1000.0000 mL | Freq: Once | INTRAVENOUS | Status: AC
  Administered 2021-06-23: 1000 mL via INTRAVENOUS

## 2021-06-23 MED ORDER — POLYETHYLENE GLYCOL 3350 17 G PO PACK: 17.0000 g | PACK | Freq: Every day | ORAL | Status: DC | PRN

## 2021-06-23 MED ORDER — FLUOXETINE HCL 20 MG PO CAPS
40.0000 mg | ORAL_CAPSULE | Freq: Every morning | ORAL | Status: DC
  Administered 2021-06-23 – 2021-06-24 (×2): 40 mg via ORAL
  Filled 2021-06-23 (×2): qty 2

## 2021-06-23 MED ORDER — LACTATED RINGERS IV SOLN: INTRAVENOUS | Status: AC

## 2021-06-23 MED ORDER — BACLOFEN 10 MG PO TABS
5.0000 mg | ORAL_TABLET | Freq: Two times a day (BID) | ORAL | Status: DC
  Filled 2021-06-23 (×2): qty 0.5

## 2021-06-23 MED ORDER — TAMSULOSIN HCL 0.4 MG PO CAPS
0.4000 mg | ORAL_CAPSULE | Freq: Every day | ORAL | Status: DC
  Administered 2021-06-23 – 2021-06-24 (×2): 0.4 mg via ORAL
  Filled 2021-06-23 (×2): qty 1

## 2021-06-23 MED ORDER — ACETAMINOPHEN 325 MG PO TABS: 650.0000 mg | ORAL_TABLET | Freq: Four times a day (QID) | ORAL | Status: DC | PRN

## 2021-06-23 MED ORDER — ENOXAPARIN SODIUM 30 MG/0.3ML IJ SOSY
30.0000 mg | PREFILLED_SYRINGE | INTRAMUSCULAR | Status: DC
  Administered 2021-06-23 – 2021-06-24 (×2): 30 mg via SUBCUTANEOUS
  Filled 2021-06-23 (×2): qty 0.3

## 2021-06-23 NOTE — Progress Notes (Addendum)
° °  Subjective: No overnight events.   Patient was seen at bedside during rounds today. Pt reports feeling well. Pt denies ongoing confusion. Reports hx of urinary retention; slow stream and dribbling.   Pt is updated on the plan for today, and all questions and concerns are addressed.   Objective:  Vital signs in last 24 hours: Vitals:   06/23/21 0230 06/23/21 0300 06/23/21 0315 06/23/21 0330  BP: 103/70 109/76 106/70 109/74  Pulse: 73 74 71 80  Resp: 15 20 17  (!) 22  Temp:      TempSrc:      SpO2: 96% 98% 99% 97%   General: NAD, nl appearance HE: Normocephalic, atraumatic, EOMI, Conjunctivae normal ENT: No congestion, no rhinorrhea, no exudate or erythema, dry mucous membranes Cardiovascular: RRR. No murmurs, rubs, or gallops Pulmonary: Effort normal, breath sounds normal. No wheezes, rales, or rhonchi Abdominal: soft, nontender, bowel sounds present Musculoskeletal: no swelling, deformity, injury or tenderness in extremities Skin: Warm, dry, no bruising, erythema, or rash Neuro: A&O x 3, follows commands  Psychiatric/Behavioral: normal mood, normal behavior   Assessment/Plan:  Principal Problem:   Encephalopathy acute  Acute metabolic encephalopathy likely d/t polypharmacy in the s/o AKI Similar presentation to 2 weeks ago when pt had AMS d/t decreased clearance of anti spasmodic medications (baclofen, gabapentin, flexeril) in s/o AKI; resolution of AMS with rapid return to baseline with holding those medications and IVF. CT head and EEG 2 weeks ago negative. CT head negative today. Suspect same etiology today; AKI (see below) on presentation contributing to reduced medication excretion resulting in AMS (now resolved). He is A&Ox3 and follows commands appropriately. There are no significant electrolyte abnormalities. UA negative. UDS positive for THC only. Acetaminophen, salicylate, and EtOH levels are negative, and ammonia wnl. Seizure less likely given not postictal appearing  and recent EEG negative. Suspect DC in 1-2 days pending AKI resolution, given mental status return to baseline.  -Eating and drinking well; continued to encourage intake  -Continue IVF: LR 100 cc/hr for 10 hours  -Holding gabapentin -Continued baclofen 5 mg BID  -Telemetry -TOC consulted for medication management help    AKI  Hx of BPH s/p transurethral resection of prostate 2017 Acute Urinary Retention  Significant elevated in Cr 3.21 from baseline of 1.2-1.5. Renal U/S negative 2 weeks ago with repeat today unchanged with no hydro (obtained after placing foley for complaints of urinary retention). Suspect this is more likely from post renal obstruction given hx of BPH, urinary complaints requiring foley today, and exaggerated increase in Cr from baseline. Could also be mixed prerenal and postrenal given reported poor po intake.  -Started Flomax  -IV LR at 125 mL/hour -Continue foley catheter -Trend I's/O's -Avoid nephrotoxins    MS Spastic paresis Not mobile at baseline; uses an electric wheelchair. Follows with neurology. On ocrevus. On gabapentin and baclofen for spasticity. -Held gabapentin in the setting of AMS -Continue baclofen   HTN Chronic and stable. Normotensive today.  -Continue amlodipine 2 g p.o. daily   HLD -Continue lipitor 40 mg daily   Depression -Continue fluoxetine 40 mg daily  Best Practice: Diet: Normal IVF: NS,125cc/hr VTE: Enoxaparin Code: Full   2018, MD  Internal Medicine Resident, PGY-1 Pager: [phone] After 5pm on weekdays and 1pm on weekends: On Call pager [phone]

## 2021-06-23 NOTE — H&P (Signed)
Date: 06/23/2021               Patient Name:  [name] MRN: [id number]  DOB: April 08, 1966 Age / Sex: 56 y.o., male   [facility]         Medical Service: Internal Medicine Teaching Service         Attending Physician: Dr. Reymundo Poll, MD    First Contact: Dr. Burnice Logan Pager: [phone]  Second Contact: Dr. Montez Morita Pager: [phone]       After Hours (After 5p/  First Contact Pager: [phone]  weekends / holidays): Second Contact Pager: [phone]   Chief Complaint: AMS  History of Present Illness: [name] is a 56 y.o. male with a PMHx of MS who presents to the ED today for AMS.   Patient was recently discharged from our service for AMS and AKI.  It was felt that the patient was confused and drowsy due to some of his medications that he was taking (baclofen, gabapentin, Flexeril).  These medications were held, and the patient returned to his baseline.  His AKI was likely prerenal in the setting of decreased oral intake.  He presents today with similar presentation from his last admission.  He is alert and responding appropriately to questions at this time.  He states that, yesterday, he was at a friend's house around 3 PM when one of his friends said that he "was not able to talk."  His friend called EMS.  Since then, the patient states that he is not having difficulty talking and that he has improved from a mental status standpoint.  His main complaint is difficulty urinating, which started in the ED.  He endorses also being constipated, however this is a chronic issue for him.  He has a home health nurse that comes by every other day, however the patient says that he manages his own medications at home.  States that he has not been eating and drinking well at home.  Denies shortness of breath, chest pain, abdominal pain, headaches, vision problems, presyncope, or falls.  No other complaints or concerns today.  Meds:  No outpatient  medications have been marked as taking for the 06/22/21 encounter [facility] Encounter).    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   Physical Exam: Blood pressure 106/70, pulse 71, temperature 97.8 F (36.6 C), temperature source Oral, resp. rate 17, SpO2 99 %. General: NAD, nl appearance HE: Normocephalic, atraumatic, EOMI, Conjunctivae normal ENT: No congestion, no rhinorrhea, no exudate or erythema, dry mucous membranes Cardiovascular: Normal rate, regular rhythm. No murmurs, rubs, or gallops Pulmonary: Effort normal, breath sounds normal. No wheezes, rales, or rhonchi Abdominal: soft, nontender, bowel  sounds present Musculoskeletal: no swelling, deformity, injury or tenderness in extremities Skin: Warm, dry, no bruising, erythema, or rash Neuro: Patient is alert and oriented to self, location, day of the week. Does not know who the current president is. Psychiatric/Behavioral: normal mood, normal behavior    EKG: personally reviewed my interpretation is NSR, LVH, probable left atrial enlargement; overall unchanged from prior  CXR: personally reviewed my interpretation is mild elevation of the left hemidiaphragm with left base  atelectasis.  CT head without contrast: Stable senescent change.  No acute intracranial abnormality.   Assessment & Plan by Problem: Principal Problem:   Encephalopathy acute  Acute encephalopathy The patient had a similar presentation earlier this month, at which time the etiology of his AMS was consistent with polypharmacy in the setting of poor clearance from his AKI.  At this time, the patient is alert and oriented x3 and is following commands appropriately.  It is likely that the patient again had decreased p.o. intake causing AKI and decreased clearance of his medications.  There are no significant electrolyte abnormalities.  Acetaminophen, salicylate, and EtOH levels are negative.  No elevation in ammonia level to suggest hepatic encephalopathy.  No acute intracranial abnormalities or any acute cardiopulmonary processes.  Seizure is also less likely given that he does not appear to be postictal.  The patient revealed to [place] that he manages his own medications at home, will therefore place consult to social work to help the patient better manage his medications. -Continue IV LR as below -Holding gabapentin  -UA, UDS pending -Telemetry  AKI Hx of BPH Creatinine of 3.21 and BUN of 38 on admit.  Baseline creatinine appears to be around 1.5 without elevation in BUN.  Patient admits to decreased p.o. intake and appears dehydrated on exam.  Likely prerenal in the setting of decreased p.o. intake.  Postrenal is a consideration, however patient does not endorse difficulty urinating prior to being in the ED. -IV LR at 125 mL/hour -Continue foley catheter -Trend I's/O's  MS Spastic paresis The patient uses an electric wheelchair.  He no longer walks much at home, even with assistive devices.  Follows with neurology. On ocrevus. On gabapentin and baclofen for his spasticity. -Held gabapentin in the setting of AMS -Continue baclofen  HTN The patient is on amlodipine at home.  Systolic BPs have  actually been ranging in the 100s while in the ED. -Continue amlodipine 2 g p.o. daily  HLD - Continue lipitor 40 mg daily  Depression -Continue fluoxetine 40 mg daily  Dispo: Admit patient to Observation with expected length of stay less than 2 midnights.  Signed: Andrey Campanile, MD 06/23/2021, 3:51 AM  Pager: [phone] After 5pm on weekdays and 1pm on weekends: On Call pager: [phone]

## 2021-06-23 NOTE — ED Notes (Signed)
Spoke with RN [place] and informed him patient is coming up , he expressed his questions.This RN answered them appropriately and will send patient up

## 2021-06-23 NOTE — ED Notes (Signed)
Breakfast order placed ?

## 2021-06-23 NOTE — ED Notes (Signed)
Called floor for purple man  

## 2021-06-23 NOTE — Progress Notes (Incomplete)
° °  Subjective: No overnight events.   Patient was seen at bedside during rounds today. Pt reports feeling well. Pt denies ongoing confusion. Urine ***  Objective:  Vital signs in last 24 hours: Vitals:   06/23/21 0315 06/23/21 0330 06/23/21 0939 06/23/21 1230  BP: 106/70 109/74 97/64 (!) 107/95  Pulse: 71 80 66 69  Resp: 17 (!) 22 18 (!) 23  Temp:      TempSrc:      SpO2: 99% 97% 100% 92%   General: NAD, nl appearance HE: Normocephalic, atraumatic, EOMI, Conjunctivae normal ENT: No congestion, no rhinorrhea, no exudate or erythema, dry mucous membranes Cardiovascular: RRR. No murmurs, rubs, or gallops Pulmonary: Effort normal, breath sounds normal. No wheezes, rales, or rhonchi Abdominal: soft, nontender, bowel sounds present Musculoskeletal: no swelling, deformity, injury or tenderness in extremities Skin: Warm, dry, no bruising, erythema, or rash Neuro: A&O x 3, follows commands  Psychiatric/Behavioral: normal mood, normal behavior   Assessment/Plan:  Principal Problem:   Encephalopathy acute  Acute metabolic encephalopathy likely d/t polypharmacy in the s/o AKI Resolved AMS with improvement of AKI and holding Gabapentin and Baclofen. He is A&Ox3 and follows commands appropriately. Eating and drinking well. Plan for voiding trial and possible discharge with close urology follow up. *** -Restart Gabapentin given GFR improved *** -Start baclofen 5 mg BID *** -Telemetry -TOC consulted for medication management help    AKI  Hx of BPH s/p transurethral resection of prostate 2017 Acute Urinary Retention  Significant elevated in Cr 3.21 from baseline of 1.2-1.5. Renal U/S negative for hydro s/p inserting foley. Suspect this is more likely from post renal obstruction given hx of BPH, urinary complaints requiring foley today, and exaggerated increase in Cr from baseline. Could also be mixed prerenal and postrenal given reported poor po intake. Plan for voiding trial and possible  discharge with close urology follow up. 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 Follows with neurology. On ocrevus. On gabapentin and baclofen for spasticity. -Resumed gabapentin given improved GFR *** -Resumed baclofen given ***   HTN Chronic and stable. Normotensive today.  -Continue amlodipine 10 mg p.o. daily   HLD -Continue lipitor 40 mg daily   Depression -Continue fluoxetine 40 mg daily  Best Practice: Diet: Normal IVF: NS,125cc/hr VTE: Enoxaparin Code: Full   Carmel [place], MD  Internal Medicine Resident, PGY-1 Pager: [phone] After 5pm on weekdays and 1pm on weekends: On Call pager [phone]

## 2021-06-23 NOTE — Hospital Course (Addendum)
Acute metabolic encephalopathy likely d/t polypharmacy in the s/o AKI Similar presentation to 2 weeks ago when pt had AMS d/t decreased clearance of anti spasmodic medications (baclofen, gabapentin, flexeril) in s/o AKI; resolution of AMS with rapid return to baseline with holding those medications and IVF. CT head and EEG 2 weeks ago negative. CT head negative today. Suspect same etiology today; AKI (see below) on presentation contributing to reduced medication excretion resulting in AMS (now resolved). He is A&Ox3 and follows commands appropriately. There are no significant electrolyte abnormalities. UA negative. UDS positive for THC only. Acetaminophen, salicylate, and EtOH levels are negative, and ammonia wnl. Seizure less likely given not postictal appearing and recent EEG negative. Gave IVF. Held baclofen and gabapentin. Rapid return of mental status to baseline with holding medications and tx of AKI. He will need a repeat BMP in 1 week.    AKI  Hx of BPH s/p transurethral resection of prostate 2017 Acute Urinary Retention  Significant elevated in Cr 3.21 from baseline of 1.2-1.5. Renal U/S negative 2 weeks ago with repeat today unchanged with no hydro (obtained after placing foley for complaints of urinary retention). Suspect this is more likely from post renal obstruction given hx of BPH, urinary complaints requiring foley today, and exaggerated increase in Cr from baseline. Could also be mixed prerenal and postrenal given reported poor po intake. GFR improved 28 -> 45. Cr improved from 3.5 -> 2.6 -> 1.75 tx as mentioned as well, including foley, IVF, and Flomax.  -Started Flomax, with plan to continue at discharge.  -Continue foley catheter at time discharge. Have scheduled a urology follow up appt for trial of void on 3/21 at 8 am.   He will need a repeat BMP in 1 week.   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 Follows with neurology. On ocrevus. On gabapentin  and baclofen for spasticity.  -Held gabapentin and baclofen in the setting of AMS  Consider dose reduction in these central acting medications during PCP visit if possible given recurrent admission for AKI leading to AMS d/t these medications.   HTN Chronic and stable. Normotensive today.  -Continue amlodipine 2 g p.o. daily   HLD -Continue lipitor 40 mg daily   Depression -Continue fluoxetine 40 mg daily

## 2021-06-24 ENCOUNTER — Other Ambulatory Visit (HOSPITAL_COMMUNITY): Payer: Self-pay

## 2021-06-24 DIAGNOSIS — G9340 Encephalopathy, unspecified: Secondary | ICD-10-CM | POA: Diagnosis not present

## 2021-06-24 LAB — BASIC METABOLIC PANEL
Anion gap: 8 (ref 5–15)
BUN: 26 mg/dL — ABNORMAL HIGH (ref 6–20)
CO2: 23 mmol/L (ref 22–32)
Calcium: 9.4 mg/dL (ref 8.9–10.3)
Chloride: 108 mmol/L (ref 98–111)
Creatinine, Ser: 1.74 mg/dL — ABNORMAL HIGH (ref 0.61–1.24)
GFR, Estimated: 45 mL/min — ABNORMAL LOW (ref >60)
Glucose, Bld: 96 mg/dL (ref 70–99)
Potassium: 4.1 mmol/L (ref 3.5–5.1)
Sodium: 139 mmol/L (ref 135–145)

## 2021-06-24 LAB — GLUCOSE, CAPILLARY: Glucose-Capillary: 130 mg/dL — ABNORMAL HIGH (ref 70–99)

## 2021-06-24 MED ORDER — TAMSULOSIN HCL 0.4 MG PO CAPS
0.4000 mg | ORAL_CAPSULE | Freq: Every day | ORAL | 0 refills | Status: AC
  Filled 2021-06-24: qty 30, 30d supply, fill #0

## 2021-06-24 MED ORDER — CHLORHEXIDINE GLUCONATE CLOTH 2 % EX PADS
6.0000 | MEDICATED_PAD | Freq: Every day | CUTANEOUS | Status: DC
  Administered 2021-06-24: 6 via TOPICAL

## 2021-06-24 MED ORDER — BENZONATATE 100 MG PO CAPS
100.0000 mg | ORAL_CAPSULE | Freq: Three times a day (TID) | ORAL | 0 refills | Status: AC | PRN
  Filled 2021-06-24: qty 20, 7d supply, fill #0

## 2021-06-24 MED ORDER — SENNOSIDES-DOCUSATE SODIUM 8.6-50 MG PO TABS
1.0000 | ORAL_TABLET | Freq: Every day | ORAL | 0 refills | Status: AC
  Filled 2021-06-24: qty 30, 30d supply, fill #0

## 2021-06-24 MED ORDER — POLYETHYLENE GLYCOL 3350 17 GM/SCOOP PO POWD
17.0000 g | Freq: Every day | ORAL | 0 refills | Status: AC | PRN
  Filled 2021-06-24: qty 238, 14d supply, fill #0

## 2021-06-24 MED ORDER — BENZONATATE 100 MG PO CAPS
100.0000 mg | ORAL_CAPSULE | Freq: Three times a day (TID) | ORAL | Status: DC | PRN
  Administered 2021-06-24: 100 mg via ORAL
  Filled 2021-06-24: qty 1

## 2021-06-24 NOTE — Discharge Summary (Addendum)
Name: [name] MRN: [id number] DOB: 10/27/1965 56 y.o. PCP: [facility] [place]  Date of Admission: 06/22/2021 10:31 PM Date of Discharge:  06/24/21 Attending Physician: Dr. Philipp Ovens  DISCHARGE DIAGNOSIS:  Primary Problem: Encephalopathy acute   Hospital Problems: Principal Problem:   Encephalopathy acute    DISCHARGE MEDICATIONS:   Allergies as of 06/24/2021       Reactions   Morphine And Related Hives   Morphine And Related Hives        Medication List     TAKE these medications    acetaminophen 325 MG tablet Commonly known as: TYLENOL Take 2 tablets (650 mg total) by mouth every 6 (six) hours as needed for mild pain (or Fever >/= 101). What changed: when to take this   amLODipine 10 MG tablet Commonly known as: NORVASC Take 10 mg by mouth daily.   aspirin EC 81 MG tablet Take 81 mg by mouth daily.   atorvastatin 40 MG tablet Commonly known as: LIPITOR Take 40 mg by mouth at bedtime.   Baclofen 5 MG Tabs Take 5 mg by mouth 2 (two) times daily.   benzonatate 100 MG capsule Commonly known as: TESSALON Take 1 capsule (100 mg total) by mouth 3 (three) times daily as needed for cough.   bisacodyl 10 MG suppository Commonly known as: DULCOLAX Place 10 mg rectally daily as needed for moderate constipation.   FLUoxetine 20 MG tablet Commonly known as: PROZAC Take 20 mg by mouth daily.   folic acid 1 MG tablet Commonly known as: FOLVITE Take 1 tablet (1 mg total) by mouth daily.   gabapentin 300 MG capsule Commonly known as: NEURONTIN Take 1 capsule (300 mg total) by mouth 2 (two) times daily.   latanoprost 0.005 % ophthalmic solution Commonly known as: XALATAN 1 drop at bedtime.   multivitamin with minerals Tabs tablet Take 1 tablet by mouth daily.   nicotine 21 mg/24hr patch Commonly known as: NICODERM CQ - dosed in mg/24 hours Place 21 mg onto the skin daily.   ocrelizumab 300 mg in sodium chloride 0.9 %  250 mL Inject 300 mg into the vein every 6 (six) months.   polyethylene glycol powder 17 GM/SCOOP powder Commonly known as: GLYCOLAX/MIRALAX Take 17 g by mouth daily as needed for mild constipation.   senna-docusate 8.6-50 MG tablet Commonly known as: Senokot-S Take 1 tablet by mouth at bedtime.   tamsulosin 0.4 MG Caps capsule Commonly known as: FLOMAX Take 1 capsule (0.4 mg total) by mouth daily after breakfast. Start taking on: June 25, 2021   vardenafil 20 MG tablet Commonly known as: LEVITRA Take 20 mg by mouth See admin instructions. Take 1-2 hours prior to intercourse.   VITAMIN D3 PO Take 25 mcg by mouth daily.        DISPOSITION AND FOLLOW-UP:  Mr.Paxon V Caver was discharged from [facility] in stable condition. At the hospital follow up visit please address:  Follow-up Recommendations: Consults: urology, neurology  Labs: BMP  Studies: trial of void at urology appt  Medications: Started Flomax daily   Follow-up Appointments:  Follow-up Information     [facility]. Go in 20 day(s).   Why: go to your urology appointment on 3/21 at 8 am with [place] information: [facility] [place], [place]. Call in 2 day(s).  Why: Call in 2 days if you do not hear back from [facility]. I have called them and they said they will schedule a folow up with your PCP and call you for when that is. Contact information: [place] [address] [phone]                 HOSPITAL COURSE:  Patient Summary: Acute metabolic encephalopathy likely d/t polypharmacy in the s/o AKI Similar presentation to 2 weeks ago when pt had AMS d/t decreased clearance of anti spasmodic medications (baclofen, gabapentin, flexeril) in s/o AKI; resolution of AMS with rapid return to baseline with holding those medications and IVF. CT head and  EEG 2 weeks ago negative. CT head negative today. Suspect same etiology today; AKI (see below) on presentation contributing to reduced medication excretion resulting in AMS (now resolved). He is A&Ox3 and follows commands appropriately. There are no significant electrolyte abnormalities. UA negative. UDS positive for THC only. Acetaminophen, salicylate, and EtOH levels are negative, and ammonia wnl. Seizure less likely given not postictal appearing and recent EEG negative. Gave IVF. Held baclofen and gabapentin. Rapid return of mental status to baseline with holding medications and tx of AKI. He will need a repeat BMP in 1 week.    AKI  Hx of BPH s/p transurethral resection of prostate 2017 Acute Urinary Retention  ? 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 Renal U/S negative 2 weeks ago with repeat today unchanged with no hydro (obtained after placing foley for complaints of urinary retention). Suspect this is more likely from post renal obstruction given hx of BPH, urinary complaints requiring foley today, and exaggerated increase in Cr from baseline. Could also be mixed prerenal and postrenal given reported poor po intake. GFR improved 28 -> 45. Cr improved from 3.5 -> 2.6 -> 1.75 tx as mentioned as well, including foley, IVF, and Flomax.  -Started Flomax, with plan to continue at discharge.  -Continue foley catheter at time discharge. Have scheduled a urology follow up appt for trial of void on 3/21 at 8 am.   He will need a repeat BMP in 1 week.   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 Follows with neurology. On ocrevus. On gabapentin and baclofen for spasticity.  -Held gabapentin and baclofen in the setting of AMS  Consider dose reduction in these central acting medications during PCP visit if possible given recurrent admission for AKI leading to AMS d/t these medications.   HTN Chronic and stable. Normotensive  today.  -Continue amlodipine 2 g p.o. daily   HLD -Continue lipitor 40 mg daily   Depression -Continue fluoxetine 40 mg daily   DISCHARGE INSTRUCTIONS:   Discharge Instructions     Call MD for:  difficulty breathing, headache or visual disturbances   Complete by: As directed    Call MD for:  extreme fatigue   Complete by: As directed    Call MD for:  hives   Complete by: As directed    Call MD for:  persistant dizziness or light-headedness   Complete by: As directed    Call MD for:  persistant nausea and vomiting   Complete by: As directed    Call MD for:  redness, tenderness, or signs of infection (pain, swelling, redness, odor or green/yellow discharge around incision site)   Complete by: As directed    Call MD for:  severe uncontrolled pain   Complete by: As  directed    Call MD for:  temperature >100.4   Complete by: As directed    Diet - low sodium heart healthy   Complete by: As directed    Increase activity slowly   Complete by: As directed    No wound care   Complete by: As directed        SUBJECTIVE:  No acute overnight events. Patient was seen at bedside during rounds this morning. Pt reports feeling well and much better this morning. He is eager to go home. He denies abdominal pain. He is ready to go home.   No other complains or concerns at this time.   All questions were addressed with patient prior to being discharged.   Discharge Vitals:   BP 120/76 (BP Location: Left Arm)    Pulse 76    Temp 98.3 F (36.8 C) (Oral)    Resp (!) 22    Wt 83.7 kg    SpO2 100%    BMI 23.69 kg/m   OBJECTIVE:  General: NAD, nl appearance HE: Normocephalic, atraumatic, EOMI, Conjunctivae normal ENT: No congestion, no rhinorrhea, no exudate or erythema, dry mucous membranes Cardiovascular: RRR. No murmurs, rubs, or gallops Pulmonary: Effort normal, breath sounds normal. No wheezes, rales, or rhonchi Abdominal: soft, nontender, bowel sounds present Musculoskeletal: no  swelling, deformity, injury or tenderness in extremities Skin: Warm, dry, no bruising, erythema, or rash Neuro: A&O x 3, follows commands  Psychiatric/Behavioral: normal mood, normal behavior   Pertinent Labs, Studies, and Procedures:  CBC Latest Ref Rng & Units 06/23/2021 06/23/2021 06/23/2021  WBC 4.0 - 10.5 K/uL 7.3 - -  Hemoglobin 13.0 - 17.0 g/dL 11.0(L) 11.9(L) 11.6(L)  Hematocrit 39.0 - 52.0 % 35.3(L) 35.0(L) 34.0(L)  Platelets 150 - 400 K/uL 291 - -    CMP Latest Ref Rng & Units 06/24/2021 06/23/2021 06/23/2021  Glucose 70 - 99 mg/dL 96 100(H) 108(H)  BUN 6 - 20 mg/dL 26(H) 36(H) 42(H)  Creatinine 0.61 - 1.24 mg/dL 1.74(H) 2.63(H) 3.50(H)  Sodium 135 - 145 mmol/L 139 141 138  Potassium 3.5 - 5.1 mmol/L 4.1 4.4 4.6  Chloride 98 - 111 mmol/L 108 111 108  CO2 22 - 32 mmol/L 23 21(L) -  Calcium 8.9 - 10.3 mg/dL 9.4 9.2 -  Total Protein 6.5 - 8.1 g/dL - 6.4(L) -  Total Bilirubin 0.3 - 1.2 mg/dL - 0.7 -  Alkaline Phos 38 - 126 U/L - 40 -  AST 15 - 41 U/L - 18 -  ALT 0 - 44 U/L - 17 -    CT Head Wo Contrast  Result Date: 06/23/2021 CLINICAL DATA:  Altered mental status EXAM: CT HEAD WITHOUT CONTRAST TECHNIQUE: Contiguous axial images were obtained from the base of the skull through the vertex without intravenous contrast. RADIATION DOSE REDUCTION: This exam was performed according to the departmental dose-optimization program which includes automated exposure control, adjustment of the mA and/or kV according to patient size and/or use of iterative reconstruction technique. COMPARISON:  06/06/2021 FINDINGS: Brain: Normal anatomic configuration. Parenchymal volume loss is commensurate with the patient's age. Moderate subcortical and periventricular white matter changes are present likely reflecting the sequela of small vessel ischemia, stable since prior examination. No abnormal intra or extra-axial mass lesion or fluid collection. No abnormal mass effect or midline shift. No evidence of  acute intracranial hemorrhage or infarct. Ventricular size is normal. Cerebellum unremarkable. Vascular: No asymmetric hyperdense vasculature at the skull base. Skull: Intact Sinuses/Orbits: Paranasal sinuses are clear. Orbits are  unremarkable. Other: Mastoid air cells and middle ear cavities are clear. IMPRESSION: Stable senescent change.  No acute intracranial abnormality. Electronically Signed   By: Fidela Salisbury M.D.   On: 06/23/2021 00:00   US RENAL  Result Date: 06/23/2021 CLINICAL DATA:  Obstruction acquired of bladder neck or vesicourethral EXAM: RENAL / URINARY TRACT ULTRASOUND COMPLETE COMPARISON:  Renal ultrasound 06/06/2021 FINDINGS: Right Kidney: Renal measurements: 10.2 x 3.6 x 5.2 cm = volume: 100 mL. Increased cortical echogenicity, unchanged. No mass or hydronephrosis visualized. Left Kidney: Renal measurements: 10.2 x 6.0 x 4.6 cm = volume: 145 mL. Increased cortical echogenicity, unchanged. No mass or hydronephrosis visualized. Bladder: Nonvisualized due to indwelling Foley catheter. Other: None. IMPRESSION: Unchanged bilateral increased cortical echogenicity as can be seen with medical renal disease. No hydronephrosis. Electronically Signed   By: Yvonne Kendall M.D.   On: 06/23/2021 11:01   DG Chest Portable 1 View  Result Date: 06/22/2021 CLINICAL DATA:  Altered mental status EXAM: PORTABLE CHEST 1 VIEW COMPARISON:  06/06/2021 FINDINGS: Mild elevation of the left hemidiaphragm. Left base atelectasis. Right lung clear. No effusions. Heart is normal size. No acute bony abnormality. IMPRESSION: Mild elevation of the left hemidiaphragm with left base atelectasis. Electronically Signed   By: Rolm Baptise M.D.   On: 06/22/2021 23:18     Lajean Manes, MD Internal Medicine Resident, PGY-1 Pager: [phone]

## 2021-06-24 NOTE — TOC Initial Note (Addendum)
Transition of Care [facility]) - Initial/Assessment Note    Patient Details  Name: [name] MRN: [id number] Date of Birth: 1965-05-16  Transition of [facility]) CM/SW Contact:    Carles Collet, RN Phone Number: 06/24/2021, 11:36 AM  Clinical Narrative:            11:30 Patient admitted from home with overdose (presumed accidental).  Services through [employer]. LVM w Roselyn Reef CSW at [facility] to assess home resources and assistance he gets for medication management.        15:50 LVM with Darrelyn Hillock RN [facility] [phone] to notify her that patient will need a ride home. Also spoke to [place] receptionist at [phone] and she states that she will let them know. Patient's WC is at home, he states that his son works until [place]. If [facility] is unable for some reason to transport, son will need to bring [facility] and transport patient home.   Received call back from [place] at [place] she will pick up patient and bring a WC from [facility]. RN instructed to call Lisabeth Pick when patient is ready to DC   Expected Discharge Plan: [place] Barriers to Discharge: Continued Medical Work up   Patient Goals and CMS Choice        Expected Discharge Plan and Services Expected Discharge Plan: [place]                                              Prior Living Arrangements/Services                       Activities of Daily Living      Permission Sought/Granted                  Emotional Assessment              Admission diagnosis:  Somnolence [R40.0] Obstruction (acquired) of bladder neck or vesicourethral orifice [N32.0] Encephalopathy acute [G93.40] AKI (acute kidney injury) ([place]) [N17.9] Patient Active Problem List   Diagnosis Date Noted   Encephalopathy acute 06/23/2021   Obstruction (acquired) of bladder neck or vesicourethral orifice    AKI (acute kidney injury) ([place])    Toxic metabolic encephalopathy    AMS (altered mental  status) 06/06/2021   Multiple sclerosis ([place]) 09/25/2020   Displaced fracture of lateral end of right clavicle, initial encounter for closed fracture 08/30/2017   Multiple sclerosis, 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

## 2021-06-24 NOTE — Discharge Instructions (Signed)
You were admitted for confusion due to your medications building up in your system because of acute kidney problem. This was fixed with foley and IV fluids. We have scheduled a follow up appt with urology for you. Please make sure you go to them to take the foley out. Also follow up with your PCP for changes in medications that may be needed.

## 2021-09-11 ENCOUNTER — Encounter: Payer: Self-pay | Admitting: Neurology

## 2021-10-02 ENCOUNTER — Ambulatory Visit
Admission: RE | Admit: 2021-10-02 | Discharge: 2021-10-02 | Disposition: A | Discharge status: Home / Self Care | Payer: Medicaid Other | Source: Ambulatory Visit | Attending: Family Medicine | Admitting: Family Medicine

## 2021-10-02 ENCOUNTER — Other Ambulatory Visit: Payer: Self-pay | Admitting: Family Medicine

## 2021-10-02 DIAGNOSIS — S9002XA Contusion of left ankle, initial encounter: Secondary | ICD-10-CM

## 2021-10-14 ENCOUNTER — Other Ambulatory Visit: Payer: Self-pay | Admitting: Neurology

## 2021-10-14 DIAGNOSIS — G35 Multiple sclerosis: Secondary | ICD-10-CM

## 2021-10-14 NOTE — Progress Notes (Signed)
I placed the orders for Ocrevus infusion.

## 2021-10-16 ENCOUNTER — Encounter (HOSPITAL_COMMUNITY): Payer: Medicaid Other

## 2021-10-23 ENCOUNTER — Ambulatory Visit: Payer: No Typology Code available for payment source | Admitting: Neurology

## 2021-11-17 ENCOUNTER — Non-Acute Institutional Stay (HOSPITAL_COMMUNITY)
Admission: RE | Admit: 2021-11-17 | Discharge: 2021-11-17 | Disposition: A | Discharge status: Home / Self Care | Payer: Medicaid Other | Source: Ambulatory Visit | Attending: Internal Medicine | Admitting: Internal Medicine

## 2021-11-17 DIAGNOSIS — G35 Multiple sclerosis: Secondary | ICD-10-CM | POA: Insufficient documentation

## 2021-11-17 MED ORDER — ACETAMINOPHEN 325 MG PO TABS
650.0000 mg | ORAL_TABLET | Freq: Once | ORAL | Status: AC
  Administered 2021-11-17: 650 mg via ORAL
  Filled 2021-11-17: qty 2

## 2021-11-17 MED ORDER — METHYLPREDNISOLONE SODIUM SUCC 125 MG IJ SOLR
125.0000 mg | Freq: Once | INTRAMUSCULAR | Status: AC
  Administered 2021-11-17: 125 mg via INTRAVENOUS
  Filled 2021-11-17: qty 2

## 2021-11-17 MED ORDER — SODIUM CHLORIDE 0.9 % IV SOLN
600.0000 mg | Freq: Once | INTRAVENOUS | Status: AC
  Administered 2021-11-17: 600 mg via INTRAVENOUS
  Filled 2021-11-17: qty 20

## 2021-11-17 MED ORDER — DIPHENHYDRAMINE HCL 50 MG/ML IJ SOLN
50.0000 mg | Freq: Once | INTRAMUSCULAR | Status: AC
  Administered 2021-11-17: 50 mg via INTRAVENOUS
  Filled 2021-11-17: qty 1

## 2021-11-17 MED ORDER — FAMOTIDINE IN NACL 20-0.9 MG/50ML-% IV SOLN
20.0000 mg | Freq: Once | INTRAVENOUS | Status: AC
  Administered 2021-11-17: 20 mg via INTRAVENOUS
  Filled 2021-11-17: qty 50

## 2021-11-17 MED ORDER — SODIUM CHLORIDE 0.9 % IV SOLN: INTRAVENOUS | Status: DC | PRN

## 2021-11-17 NOTE — Progress Notes (Signed)
PATIENT CARE CENTER NOTE   Diagnosis: Multiple sclerosis (HCC) [G35]   Provider: Margie Ege, NP   Procedure: Emogene Morgan infusion    Note:  Patient received Ocrevus 600 mg infusion via PIV. Pre-medications given per order. Patient tolerated infusion well with no adverse reaction. Patient declined to wait for the 1 hour observation post infusion. Vital signs stable. Discharge instructions given. Patient to come back every 6 months for infusions and will schedule next appointment at the front desk. Patient alert, oriented and transfers in motorized wheelchair at discharge.

## 2021-12-22 ENCOUNTER — Ambulatory Visit (INDEPENDENT_AMBULATORY_CARE_PROVIDER_SITE_OTHER): Payer: Medicaid Other | Admitting: Neurology

## 2021-12-22 ENCOUNTER — Telehealth: Payer: Self-pay | Admitting: Neurology

## 2021-12-22 ENCOUNTER — Encounter: Payer: Self-pay | Admitting: Neurology

## 2021-12-22 VITALS — BP 123/80 | HR 50 | Ht 73.0 in

## 2021-12-22 DIAGNOSIS — G35 Multiple sclerosis: Secondary | ICD-10-CM | POA: Diagnosis not present

## 2021-12-22 DIAGNOSIS — R269 Unspecified abnormalities of gait and mobility: Secondary | ICD-10-CM
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
## 2021-12-22 NOTE — Telephone Encounter (Signed)
Sent to Aaron with [facility]  to see if he would be able to take the patient.  

## 2021-12-22 NOTE — Telephone Encounter (Signed)
[name] is not able to take the patient.  I sent it to [place] with [facility]) to see if she would be able to take the patient.

## 2021-12-23 LAB — IGG, IGA, IGM
IgA/Immunoglobulin A, Serum: 195 mg/dL (ref 90–386)
IgG (Immunoglobin G), Serum: 1357 mg/dL (ref 603–1613)
IgM (Immunoglobulin M), Srm: 53 mg/dL (ref 20–172)

## 2021-12-23 LAB — CBC WITH DIFFERENTIAL/PLATELET
Basophils Absolute: 0.1 10*3/uL (ref 0.0–0.2)
Basos: 2 %
EOS (ABSOLUTE): 0.1 10*3/uL (ref 0.0–0.4)
Eos: 3 %
Hematocrit: 41.7 % (ref 37.5–51.0)
Hemoglobin: 14.3 g/dL (ref 13.0–17.7)
Immature Grans (Abs): 0 10*3/uL (ref 0.0–0.1)
Immature Granulocytes: 0 %
Lymphocytes Absolute: 2 10*3/uL (ref 0.7–3.1)
Lymphs: 46 %
MCH: 30.6 pg (ref 26.6–33.0)
MCHC: 34.3 g/dL (ref 31.5–35.7)
MCV: 89 fL (ref 79–97)
Monocytes Absolute: 0.3 10*3/uL (ref 0.1–0.9)
Monocytes: 8 %
Neutrophils Absolute: 1.7 10*3/uL (ref 1.4–7.0)
Neutrophils: 41 %
Platelets: 224 10*3/uL (ref 150–450)
RBC: 4.68 x10E6/uL (ref 4.14–5.80)
RDW: 13.1 % (ref 11.6–15.4)
WBC: 4.2 10*3/uL (ref 3.4–10.8)

## 2021-12-23 NOTE — Telephone Encounter (Signed)
Spoke with pt on this he is agreeable to out patient physical therapy. I have placed referral.

## 2021-12-23 NOTE — Telephone Encounter (Signed)
[place] sent me a message stating:  Per insurance patient is active with [facility] therefore they will not cover home health.    He would have to be outpatient.

## 2021-12-23 NOTE — Addendum Note (Signed)
Addended by: Ann Maki on: 12/23/2021 09:57 AM   Modules accepted: Orders

## 2021-12-24 ENCOUNTER — Telehealth: Payer: Self-pay

## 2021-12-24 NOTE — Telephone Encounter (Signed)
I called the patient and informed him of normal lab findings. He verbalized understanding and expressed appreciation for the call.

## 2021-12-24 NOTE — Telephone Encounter (Signed)
-----   Message from Levert Feinstein, MD sent at 12/23/2021  4:56 PM EDT ----- Please call patient, laboratory evaluation showed no significant abnormalities.

## 2021-12-31 ENCOUNTER — Ambulatory Visit: Payer: Self-pay | Admitting: Rehabilitation

## 2022-05-18 ENCOUNTER — Encounter (HOSPITAL_COMMUNITY): Payer: Medicaid Other

## 2022-05-19 ENCOUNTER — Other Ambulatory Visit: Payer: Self-pay

## 2022-06-01 ENCOUNTER — Other Ambulatory Visit: Payer: Self-pay

## 2022-06-01 DIAGNOSIS — G35 Multiple sclerosis: Secondary | ICD-10-CM

## 2022-06-02 ENCOUNTER — Non-Acute Institutional Stay (HOSPITAL_COMMUNITY)
Admission: RE | Admit: 2022-06-02 | Discharge: 2022-06-02 | Disposition: A | Discharge status: Home / Self Care | Payer: Medicaid Other | Source: Ambulatory Visit | Attending: Internal Medicine | Admitting: Internal Medicine

## 2022-06-02 DIAGNOSIS — G35 Multiple sclerosis: Secondary | ICD-10-CM | POA: Insufficient documentation

## 2022-06-02 MED ORDER — DIPHENHYDRAMINE HCL 50 MG/ML IJ SOLN
50.0000 mg | Freq: Once | INTRAMUSCULAR | Status: AC
  Administered 2022-06-02: 50 mg via INTRAVENOUS
  Filled 2022-06-02: qty 1

## 2022-06-02 MED ORDER — FAMOTIDINE IN NACL 20-0.9 MG/50ML-% IV SOLN
20.0000 mg | Freq: Once | INTRAVENOUS | Status: AC
  Administered 2022-06-02: 20 mg via INTRAVENOUS
  Filled 2022-06-02: qty 50

## 2022-06-02 MED ORDER — SODIUM CHLORIDE 0.9 % IV SOLN
600.0000 mg | Freq: Once | INTRAVENOUS | Status: AC
  Administered 2022-06-02: 600 mg via INTRAVENOUS
  Filled 2022-06-02: qty 20

## 2022-06-02 MED ORDER — ACETAMINOPHEN 325 MG PO TABS
650.0000 mg | ORAL_TABLET | Freq: Once | ORAL | Status: AC
  Administered 2022-06-02: 650 mg via ORAL
  Filled 2022-06-02: qty 2

## 2022-06-02 MED ORDER — SODIUM CHLORIDE 0.9 % IV SOLN
INTRAVENOUS | Status: DC | PRN
Start: 2022-06-02 — End: 2022-06-03

## 2022-06-02 MED ORDER — METHYLPREDNISOLONE SODIUM SUCC 125 MG IJ SOLR
125.0000 mg | Freq: Once | INTRAMUSCULAR | Status: AC
  Administered 2022-06-02: 125 mg via INTRAVENOUS
  Filled 2022-06-02: qty 2

## 2022-06-02 NOTE — Progress Notes (Signed)
PATIENT CARE CENTER NOTE  Diagnosis: Multiple sclerosis ([place]) [G35]    Provider: Marcial Pacas MD  Procedure: Ocrevus infusion  Note: Patient received Ocrevus infusion (dose #1 of 1) via PIV. Pt pre- medicated with PO Tylenol 650 mg, IV Benadryl 50 mg, IV Solumedrol 125 mg, and 20 mg IVPB Pepcid. During infusion, pt pointed out that he had some welts on his right arm. Pt reported that he was itching last night. Hydrocortisone cream given to pt, and bumps resolved. Vital signs stable. Discharge instructions given. Patient declined to stay for the 1 hour post infusion observation.Pt to come back every 6 months for infusion.  Patient advised to schedule next appointment at front desk. Alert, oriented and transfers in motorized wheelchair at discharge.

## 2022-09-23 ENCOUNTER — Ambulatory Visit: Payer: No Typology Code available for payment source | Admitting: Neurology

## 2022-09-29 ENCOUNTER — Ambulatory Visit (INDEPENDENT_AMBULATORY_CARE_PROVIDER_SITE_OTHER): Payer: Medicaid Other | Admitting: Neurology

## 2022-09-29 ENCOUNTER — Encounter: Payer: Self-pay | Admitting: Neurology

## 2022-09-29 VITALS — BP 117/73 | HR 65 | Resp 14 | Ht 73.0 in | Wt 184.5 lb

## 2022-09-29 DIAGNOSIS — G35 Multiple sclerosis: Secondary | ICD-10-CM | POA: Diagnosis not present
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
## 2022-09-30 ENCOUNTER — Telehealth: Payer: Self-pay | Admitting: Neurology

## 2022-09-30 LAB — IGG, IGA, IGM
IgA/Immunoglobulin A, Serum: 186 mg/dL (ref 90–386)
IgG (Immunoglobin G), Serum: 1263 mg/dL (ref 603–1613)
IgM (Immunoglobulin M), Srm: 50 mg/dL (ref 20–172)

## 2022-09-30 LAB — CBC WITH DIFFERENTIAL/PLATELET
Basophils Absolute: 0.1 10*3/uL (ref 0.0–0.2)
Basos: 1 %
EOS (ABSOLUTE): 0.1 10*3/uL (ref 0.0–0.4)
Eos: 2 %
Hematocrit: 42.3 % (ref 37.5–51.0)
Hemoglobin: 14.2 g/dL (ref 13.0–17.7)
Immature Grans (Abs): 0 10*3/uL (ref 0.0–0.1)
Immature Granulocytes: 0 %
Lymphocytes Absolute: 1.7 10*3/uL (ref 0.7–3.1)
Lymphs: 41 %
MCH: 30.3 pg (ref 26.6–33.0)
MCHC: 33.6 g/dL (ref 31.5–35.7)
MCV: 90 fL (ref 79–97)
Monocytes Absolute: 0.4 10*3/uL (ref 0.1–0.9)
Monocytes: 9 %
Neutrophils Absolute: 1.9 10*3/uL (ref 1.4–7.0)
Neutrophils: 47 %
Platelets: 243 10*3/uL (ref 150–450)
RBC: 4.69 x10E6/uL (ref 4.14–5.80)
RDW: 12.9 % (ref 11.6–15.4)
WBC: 4.1 10*3/uL (ref 3.4–10.8)

## 2022-09-30 NOTE — Telephone Encounter (Signed)
She is asking if it can be email. Stated her fax is down today.

## 2022-09-30 NOTE — Telephone Encounter (Signed)
[name] called stated she spoke with nurse about form yesterday. She is asking if for can be email to [email or web address]

## 2022-09-30 NOTE — Progress Notes (Signed)
Sent note to [facility]

## 2022-09-30 NOTE — Telephone Encounter (Signed)
Email of paperwork sent to [email or web address] as requested

## 2022-10-20 ENCOUNTER — Encounter (HOSPITAL_COMMUNITY): Payer: Self-pay

## 2022-10-20 ENCOUNTER — Emergency Department (HOSPITAL_COMMUNITY): Payer: Medicaid Other

## 2022-10-20 ENCOUNTER — Other Ambulatory Visit: Payer: Self-pay

## 2022-10-20 ENCOUNTER — Emergency Department (HOSPITAL_COMMUNITY)
Admission: EM | Admit: 2022-10-20 | Discharge: 2022-10-21 | Disposition: A | Discharge status: Home / Self Care | Payer: Medicaid Other | Attending: Student | Admitting: Student

## 2022-10-20 DIAGNOSIS — Z7982 Long term (current) use of aspirin: Secondary | ICD-10-CM | POA: Insufficient documentation

## 2022-10-20 DIAGNOSIS — R944 Abnormal results of kidney function studies: Secondary | ICD-10-CM | POA: Insufficient documentation

## 2022-10-20 DIAGNOSIS — R531 Weakness: Secondary | ICD-10-CM | POA: Insufficient documentation

## 2022-10-20 DIAGNOSIS — Z79899 Other long term (current) drug therapy: Secondary | ICD-10-CM | POA: Diagnosis not present

## 2022-10-20 DIAGNOSIS — D72829 Elevated white blood cell count, unspecified: Secondary | ICD-10-CM | POA: Insufficient documentation

## 2022-10-20 DIAGNOSIS — I10 Essential (primary) hypertension: Secondary | ICD-10-CM | POA: Diagnosis not present

## 2022-10-20 DIAGNOSIS — F17210 Nicotine dependence, cigarettes, uncomplicated: Secondary | ICD-10-CM | POA: Diagnosis not present

## 2022-10-20 LAB — C-REACTIVE PROTEIN: CRP: <0.5 mg/dL (ref <1.0)

## 2022-10-20 LAB — COMPREHENSIVE METABOLIC PANEL
ALT: 16 U/L (ref 0–44)
AST: 27 U/L (ref 15–41)
Albumin: 4.5 g/dL (ref 3.5–5.0)
Alkaline Phosphatase: 58 U/L (ref 38–126)
Anion gap: 7 (ref 5–15)
BUN: 24 mg/dL — ABNORMAL HIGH (ref 6–20)
CO2: 20 mmol/L — ABNORMAL LOW (ref 22–32)
Calcium: 9.5 mg/dL (ref 8.9–10.3)
Chloride: 111 mmol/L (ref 98–111)
Creatinine, Ser: 1.44 mg/dL — ABNORMAL HIGH (ref 0.61–1.24)
GFR, Estimated: 56 mL/min — ABNORMAL LOW (ref >60)
Glucose, Bld: 113 mg/dL — ABNORMAL HIGH (ref 70–99)
Potassium: 4.6 mmol/L (ref 3.5–5.1)
Sodium: 138 mmol/L (ref 135–145)
Total Bilirubin: 1.4 mg/dL — ABNORMAL HIGH (ref 0.3–1.2)
Total Protein: 8 g/dL (ref 6.5–8.1)

## 2022-10-20 LAB — CBC WITH DIFFERENTIAL/PLATELET
Abs Immature Granulocytes: 0.04 10*3/uL (ref 0.00–0.07)
Basophils Absolute: 0 10*3/uL (ref 0.0–0.1)
Basophils Relative: 0 %
Eosinophils Absolute: 0 10*3/uL (ref 0.0–0.5)
Eosinophils Relative: 0 %
HCT: 41.9 % (ref 39.0–52.0)
Hemoglobin: 13.8 g/dL (ref 13.0–17.0)
Immature Granulocytes: 0 %
Lymphocytes Relative: 9 %
Lymphs Abs: 1.1 10*3/uL (ref 0.7–4.0)
MCH: 29.9 pg (ref 26.0–34.0)
MCHC: 32.9 g/dL (ref 30.0–36.0)
MCV: 90.9 fL (ref 80.0–100.0)
Monocytes Absolute: 0.7 10*3/uL (ref 0.1–1.0)
Monocytes Relative: 5 %
Neutro Abs: 11.3 10*3/uL — ABNORMAL HIGH (ref 1.7–7.7)
Neutrophils Relative %: 86 %
Platelets: 236 10*3/uL (ref 150–400)
RBC: 4.61 MIL/uL (ref 4.22–5.81)
RDW: 13 % (ref 11.5–15.5)
WBC: 13.2 10*3/uL — ABNORMAL HIGH (ref 4.0–10.5)
nRBC: 0 % (ref 0.0–0.2)

## 2022-10-20 LAB — SEDIMENTATION RATE: Sed Rate: 16 mm/hr (ref 0–16)

## 2022-10-20 MED ORDER — GADOBUTROL 1 MMOL/ML IV SOLN
9.0000 mL | Freq: Once | INTRAVENOUS | Status: AC | PRN
  Administered 2022-10-20: 9 mL via INTRAVENOUS

## 2022-10-20 NOTE — ED Provider Notes (Signed)
[place] EMERGENCY DEPARTMENT AT [facility] Provider Note  CSN: [id number] Arrival date & time: 10/20/22 1605  Chief Complaint(s) Weakness  HPI [name] is a 57 y.o. 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  He states that he has been stuck in his wheelchair for the last 2 days and now feels like he is completely unable to move his legs.  He states that he normally has more strength in his legs despite being in a wheelchair and has been unable to get physical therapy to come to his house and help him.  He currently endorses bilateral lower extremity pain but denies chest pain, shortness of breath, abdominal pain, nausea, vomiting or other systemic symptoms.   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   Patient Active Problem List   Diagnosis Date Noted   Gait abnormality 12/22/2021   Encephalopathy acute 06/23/2021   Obstruction (acquired) of bladder neck or vesicourethral orifice    AKI (acute kidney injury) (HCC)    Toxic metabolic encephalopathy    AMS (altered mental status) 06/06/2021   Multiple sclerosis (HCC) 09/25/2020   Displaced fracture of lateral end of right clavicle, initial encounter for closed fracture 08/30/2017   Multiple sclerosis, 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 Authorizing Provider  acetaminophen (TYLENOL) 325 MG tablet Take 2 tablets (650 mg total) by mouth every 6 (six) hours as needed for mild pain (or Fever >/= 101). Patient taking differently: Take 650 mg by mouth every 8 (eight) hours as needed for mild pain (or Fever >/= 101). 06/10/21   Marolyn Haller, MD  amLODipine (NORVASC) 10 MG tablet Take 10 mg by mouth daily.    [provider]  aspirin EC 81 MG tablet Take 81 mg by mouth daily.    [provider]  atorvastatin (LIPITOR) 40 MG tablet Take 40 mg by mouth at bedtime.    [provider]  baclofen 5 MG TABS Take 5 mg by mouth 2 (two) times daily. 06/10/21   Marolyn Haller, MD  benzonatate (TESSALON) 100 MG capsule Take 1 capsule (100 mg total) by mouth 3 (three) times daily as needed for cough. 06/24/21   Carmel Sacramento, MD  bisacodyl (DULCOLAX) 10 MG suppository Place 10 mg rectally daily as needed for moderate constipation.    [provider]  Cholecalciferol (VITAMIN D3 PO) Take 25 mcg by mouth daily.    [provider]  FLUoxetine (PROZAC) 20 MG tablet Take 20 mg by mouth daily.    [provider]  folic acid (FOLVITE) 1 MG tablet Take 1 tablet (1 mg total) by mouth daily. 06/11/21   Marolyn Haller, MD  gabapentin (NEURONTIN) 300 MG capsule Take 1 capsule (300 mg total) by mouth 2 (two)  times daily. 03/07/20   Glean Salvo, NP  latanoprost (XALATAN) 0.005 % ophthalmic solution 1 drop at bedtime.    [provider]  Multiple Vitamin (MULTIVITAMIN WITH MINERALS) TABS tablet Take 1 tablet by mouth daily. 06/11/21   Marolyn Haller, MD  nicotine (NICODERM CQ - DOSED IN MG/24 HOURS) 21 mg/24hr patch Place 21 mg onto the skin daily.    [provider]  ocrelizumab 300 mg in sodium chloride 0.9 % 250 mL Inject 300 mg into the vein every 6 (six)  months.    [provider]  polyethylene glycol powder (GLYCOLAX/MIRALAX) 17 GM/SCOOP powder Take 17 g by mouth daily as needed for mild constipation. 06/24/21   Carmel Sacramento, MD  senna-docusate (SENOKOT-S) 8.6-50 MG tablet Take 1 tablet by mouth at bedtime. 06/24/21   Carmel Sacramento, MD  tamsulosin (FLOMAX) 0.4 MG CAPS capsule Take 1 capsule (0.4 mg total) by mouth daily after breakfast. 06/25/21   Carmel Sacramento, MD  vardenafil (LEVITRA) 20 MG tablet Take 20 mg by mouth See admin instructions. Take 1-2 hours prior to intercourse.    [provider]                                                                                                                                    Past Surgical History Past Surgical History:  Procedure Laterality Date   APPENDECTOMY     CLOSED REDUCTION NASAL FRACTURE  10-08-2004   and Septoplasty   HERNIA REPAIR     pt states has had hiatal hernia repair    HERNIA REPAIR     left ankle surgery      LUMBAR DISC SURGERY  11-02-2000   L4 -- L5   LUMBAR DISC SURGERY  2002   ORIF ANKLE FRACTURE Right 2008   ORIF HUMERUS FRACTURE Right 2008   ORIF RIGHT PROXIMAL HUMERUS FX AND ORIF RIGHT ANKLE FX  01-27-2007   RIGHT SHOULDER MANIPULATION WITH HARDWARE REMOVAL/  RIGHT TIBIA NONUNION SITE  HARDWARE REMOVAL AND EXCHANGED AND BONE GRAFT  AND ALLOGRAFT  05-10-2007   SEPTOPLASTY  2006   SHOULDER SURGERY Right 2009   TRANSURETHRAL RESECTION OF PROSTATE N/A 10/28/2015   Procedure: TRANSURETHRAL RESECTION OF THE PROSTATE WITH GYRUS INSTRUMENTS;  Surgeon: Malen Gauze, MD;  Location: [facility];  Service: Urology;  Laterality: N/A;   TRANSURETHRAL RESECTION OF PROSTATE  10/28/2015   Family History Family History  Problem Relation Age of Onset   Colon cancer Mother     Social History Social History   Tobacco Use   Smoking status: Every Day    Packs/day: 0.50    Years: 30.00    Additional pack years: 0.00    Total pack years: 15.00    Types:  Cigarettes   Smokeless tobacco: Never  Substance Use Topics   Alcohol use: Not Currently    Comment: Drinks beer occasionally   Drug use: Not  Currently    Types: Cocaine, Marijuana    Comment:  cocaine use--  per pt last cocaine Feb 2017   Allergies Morphine, Morphine and codeine, and Morphine and codeine  Review of Systems Review of Systems  Musculoskeletal:  Positive for arthralgias and myalgias.  Neurological:  Positive for weakness.    Physical Exam Vital Signs  I have reviewed the triage vital signs BP 127/88   Pulse 90   Temp 98.5 F (36.9 C) (Oral)   Resp 18   Ht 6\' 1"  (1.854 m)   Wt 92.5 kg   SpO2 99%   BMI 26.91 kg/m   Physical Exam Constitutional:      General: He is not in acute distress.    Appearance: Normal appearance.  HENT:     Head: Normocephalic and atraumatic.     Nose: No congestion or rhinorrhea.  Eyes:     General:        Right eye: No discharge.        Left eye: No discharge.     Extraocular Movements: Extraocular movements intact.     Pupils: Pupils are equal, round, and reactive to light.  Cardiovascular:     Rate and Rhythm: Normal rate and regular rhythm.     Heart sounds: No murmur heard. Pulmonary:     Effort: No respiratory distress.     Breath sounds: No wheezing or rales.  Abdominal:     General: There is no distension.     Tenderness: There is no abdominal tenderness.  Musculoskeletal:        General: Tenderness present. Normal range of motion.     Cervical back: Normal range of motion.  Skin:    General: Skin is warm and dry.  Neurological:     General: No focal deficit present.     Mental Status: He is alert.     Motor: Weakness present.     ED Results and Treatments Labs (all labs ordered are listed, but only abnormal results are displayed) Labs Reviewed  CBC WITH DIFFERENTIAL/PLATELET - Abnormal; Notable for the following components:      Result Value   WBC 13.2 (*)    Neutro Abs 11.3 (*)    All other  components within normal limits  COMPREHENSIVE METABOLIC PANEL  SEDIMENTATION RATE  C-REACTIVE PROTEIN  URINALYSIS, ROUTINE W REFLEX MICROSCOPIC                                                                                                                          Radiology No results found.  Pertinent labs & imaging results that were available during my care of the patient were reviewed by me and considered in my medical decision making (see MDM for details).  Medications Ordered in ED Medications - No data to display  Procedures Procedures  (including critical care time)  Medical Decision Making / ED Course   This patient presents to the ED for concern of lower extremity weakness, this involves an extensive number of treatment options, and is a complaint that carries with it a high risk of complications and morbidity.  The differential diagnosis includes progression of underlying MS, MS flare, peripheral nerve compression from extended wheelchair use, electrolyte abnormality  MDM: Patient seen emergency room for evaluation of lower extremity weakness.  Physical exam initially with significant lower extremity weakness and inability to engage the proximal muscles.  Spasticity in the lower extremities consistent with his MS.  Laboratory evaluation with a BUN of 24, creatinine 1.44 which is improved from baseline, mild leukocytosis to 13.2, ESR CRP is negative.  Spoke with Dr. Otelia Limes of neurology who is recommending MRI T and L-spine with and without contrast to evaluate for MS flare.  These are reassuringly negative for worsening MS lesions and does show some foraminal stenosis but no canal stenosis that would explain his lower extremity weakness.  On my reevaluation, patient's symptoms appear to have resolved.  He feels that he is back to his normal functional  status and is able to engage his proximal muscles.  With symptoms resolved, patient does not meet inpatient criteria for admission and he is safe for discharge with outpatient neurology follow-up.  Leading theory is that pts symptoms today are secondary to peripheral sciatic nerve compression from extended wheelchair use, and as he has been taken out of his wheelchair and comfortably laying in the bed for multiple hours here, his symptoms have resolved.  Patient then discharged with outpatient neurology follow-up   Additional history obtained:  -External records from outside source obtained and reviewed including: Chart review including previous notes, labs, imaging, consultation notes   Lab Tests: -I ordered, reviewed, and interpreted labs.   The pertinent results include:   Labs Reviewed  CBC WITH DIFFERENTIAL/PLATELET - Abnormal; Notable for the following components:      Result Value   WBC 13.2 (*)    Neutro Abs 11.3 (*)    All other components within normal limits  COMPREHENSIVE METABOLIC PANEL  SEDIMENTATION RATE  C-REACTIVE PROTEIN  URINALYSIS, ROUTINE W REFLEX MICROSCOPIC        Imaging Studies ordered: I ordered imaging studies including MRI T and L-spine I independently visualized and interpreted imaging. I agree with the radiologist interpretation   Medicines ordered and prescription drug management: No orders of the defined types were placed in this encounter.   -I have reviewed the patients home medicines and have made adjustments as needed  Critical interventions none  Consultations Obtained: I requested consultation with the neurologist on-call Dr. Otelia Limes,  and discussed lab and imaging findings as well as pertinent plan - they recommend: MRI T and L-spine   Cardiac Monitoring: The patient was maintained on a cardiac monitor.  I personally viewed and interpreted the cardiac monitored which showed an underlying rhythm of: NSR  Social Determinants of  Health:  Factors impacting patients care include: Has been having difficulty getting physical therapy to come to his home   Reevaluation: After the interventions noted above, I reevaluated the patient and found that they have :improved  Co morbidities that complicate the patient evaluation  Past Medical History:  Diagnosis Date   Abnormality of gait 11/28/2015   BPH (benign prostatic hyperplasia)    Depression    Environmental and seasonal allergies    History of hiatal hernia  Hypertension    Multiple sclerosis (HCC)    Multiple sclerosis, 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

## 2022-10-20 NOTE — ED Triage Notes (Signed)
BIB EMS from home for weakness in bilateral legs over the last 12 hours. Pt too walk to transfer himself to bed last night.  Hx of MS

## 2022-10-21 NOTE — ED Notes (Signed)
[facility] contacted for transport home.  

## 2022-10-21 NOTE — ED Notes (Signed)
Pt's brief and linen changed.  

## 2022-10-22 ENCOUNTER — Telehealth: Payer: Self-pay | Admitting: Neurology

## 2022-10-22 NOTE — Telephone Encounter (Signed)
Returned call to pt and they stated [facility] told them they had bed bugs. And they cannot see them until they are cleared. He stated he hasn't been cleared. But that he is doing much better and has a bed and an aide now.

## 2022-10-22 NOTE — Telephone Encounter (Signed)
I reviewed ER note 10/20/2022 for evaluation of lower extremity weakness.  Appears having difficulty getting an aide or physical therapy at his home.  He was in his wheelchair for 2 days.  Discussed with on-call neurology who recommended MRI thoracic and lumbar spine to evaluate for MS flare.  Imaging was negative.  After resting comfortably in the bed his symptoms resolved and he was back to normal functional status.  Labs showed creatinine 1.44 (improved from baseline), WBC 13.2, normal ESR and CRP.   IMPRESSION: Normal MRI of the thoracic spine and spinal cord. No evidence for demyelinating disease.  IMPRESSION: 1. Normal MRI appearance of the conus medullaris and nerve roots of the cauda equina. 2. Left eccentric disc bulge with facet hypertrophy at L4-5 with resultant mild to moderate left and mild right L4 foraminal stenosis. 3. Mild degenerative disc disease at L3-4 and L5-S1 without significant stenosis or neural impingement. 4. Remote left hemi laminectomy at L4-5.  Can we call to check on him, he likely needs  to follow up with [facility] about home health services but I did complete paperwork last visit for him to get state assistance with an in home aide. Thanks

## 2022-10-22 NOTE — Telephone Encounter (Signed)
Results given.

## 2022-11-30 ENCOUNTER — Other Ambulatory Visit: Payer: Self-pay

## 2022-11-30 DIAGNOSIS — G35 Multiple sclerosis: Secondary | ICD-10-CM

## 2022-11-30 NOTE — Addendum Note (Signed)
Addended by: Deatra James on: 11/30/2022 09:49 AM   Modules accepted: Orders

## 2022-12-01 ENCOUNTER — Encounter (HOSPITAL_COMMUNITY): Payer: Medicaid Other

## 2022-12-07 ENCOUNTER — Encounter (HOSPITAL_COMMUNITY): Payer: Medicaid Other

## 2022-12-15 ENCOUNTER — Non-Acute Institutional Stay (HOSPITAL_COMMUNITY)
Admission: RE | Admit: 2022-12-15 | Discharge: 2022-12-15 | Disposition: A | Discharge status: Home / Self Care | Payer: Medicaid Other | Source: Ambulatory Visit | Attending: Internal Medicine | Admitting: Internal Medicine

## 2022-12-15 DIAGNOSIS — G35 Multiple sclerosis: Secondary | ICD-10-CM | POA: Diagnosis present

## 2022-12-15 MED ORDER — DIPHENHYDRAMINE HCL 50 MG/ML IJ SOLN
50.0000 mg | Freq: Once | INTRAMUSCULAR | Status: AC
  Administered 2022-12-15: 50 mg via INTRAVENOUS
  Filled 2022-12-15: qty 1

## 2022-12-15 MED ORDER — ACETAMINOPHEN 325 MG PO TABS
650.0000 mg | ORAL_TABLET | Freq: Once | ORAL | Status: AC
  Administered 2022-12-15: 650 mg via ORAL
  Filled 2022-12-15: qty 2

## 2022-12-15 MED ORDER — FAMOTIDINE IN NACL 20-0.9 MG/50ML-% IV SOLN
20.0000 mg | Freq: Once | INTRAVENOUS | Status: AC
  Administered 2022-12-15: 20 mg via INTRAVENOUS
  Filled 2022-12-15: qty 50

## 2022-12-15 MED ORDER — SODIUM CHLORIDE 0.9 % IV SOLN
600.0000 mg | Freq: Once | INTRAVENOUS | Status: AC
  Administered 2022-12-15: 600 mg via INTRAVENOUS
  Filled 2022-12-15: qty 20

## 2022-12-15 MED ORDER — SODIUM CHLORIDE 0.9 % IV SOLN: INTRAVENOUS | Status: DC | PRN

## 2022-12-15 MED ORDER — METHYLPREDNISOLONE SODIUM SUCC 125 MG IJ SOLR
125.0000 mg | Freq: Once | INTRAMUSCULAR | Status: AC
  Administered 2022-12-15: 125 mg via INTRAVENOUS
  Filled 2022-12-15: qty 2

## 2022-12-15 NOTE — Progress Notes (Signed)
PATIENT CARE CENTER NOTE   Diagnosis: Multiple sclerosis (HCC) [G35]    Provider: Margie Ege, NP   Procedure: Emogene Morgan infusion    Note: Patient received 600 mg Ocrevus infusion via PIV. Pre-meds given per order (Pepcid IVPB, Tylenol PO, Benadryl IV and Benadryl IV). Infusion titrated per protocol. Patient tolerated infusion well with no adverse reaction. Vital signs remained stable. Patient unable to wait the 1 hour post infusion observation. Discharge instructions given. Patient alert, oriented and transfers in personal motorized wheelchair at discharge.

## 2023-03-23 ENCOUNTER — Telehealth: Payer: Self-pay | Admitting: Neurology

## 2023-03-23 ENCOUNTER — Encounter: Payer: Self-pay | Admitting: Neurology

## 2023-03-23 NOTE — Telephone Encounter (Signed)
Unable to reach pt using both phone numbers on file. Sent letter in mail informing pt of need to reschedule 04/15/23 appt - NP out

## 2023-04-15 ENCOUNTER — Ambulatory Visit: Payer: Medicaid Other | Admitting: Neurology

## 2023-06-08 ENCOUNTER — Telehealth: Payer: Self-pay | Admitting: Neurology

## 2023-06-08 ENCOUNTER — Telehealth: Payer: Self-pay

## 2023-06-08 ENCOUNTER — Other Ambulatory Visit: Payer: Self-pay | Admitting: Neurology

## 2023-06-08 DIAGNOSIS — G35 Multiple sclerosis: Secondary | ICD-10-CM

## 2023-06-08 DIAGNOSIS — R269 Unspecified abnormalities of gait and mobility: Secondary | ICD-10-CM

## 2023-06-08 NOTE — Progress Notes (Signed)
I placed orders for patient for Ocrevus. Patient is due for revisit. Schedule 2/20 for Ocrevus, can you get him in to see me before? Thanks

## 2023-06-08 NOTE — Telephone Encounter (Signed)
Call to patient to schedule visit prior to 2/*20 ocrevus infusion. LVM to call back. Spot held on 2/13.
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
## 2023-06-09 NOTE — Telephone Encounter (Signed)
Returned call to number provided. Patient in agreement for office visit 2/13 at 11:15

## 2023-06-09 NOTE — Telephone Encounter (Signed)
Pt's son called stating that he received a message regarding his father. Son updated pt's information and would like the RN to call the mobile number provided so that the information can be relayed to the pt. Please advise.

## 2023-06-09 NOTE — Telephone Encounter (Signed)
Multiple calls to all contacts listed, no answer. Attempted to get patient scheduled prior to 2/20 before ocrevus infusion. Also needs labs

## 2023-06-09 NOTE — Telephone Encounter (Signed)
Open in error

## 2023-06-10 ENCOUNTER — Ambulatory Visit (INDEPENDENT_AMBULATORY_CARE_PROVIDER_SITE_OTHER): Payer: Medicaid Other | Admitting: Neurology

## 2023-06-10 ENCOUNTER — Encounter: Payer: Self-pay | Admitting: Neurology

## 2023-06-10 VITALS — BP 117/82 | HR 81 | Resp 17 | Ht 72.0 in | Wt 203.9 lb

## 2023-06-10 DIAGNOSIS — R269 Unspecified abnormalities of gait and mobility: Secondary | ICD-10-CM

## 2023-06-10 DIAGNOSIS — G35 Multiple sclerosis: Secondary | ICD-10-CM

## 2023-06-10 DIAGNOSIS — G8250 Quadriplegia, unspecified: Secondary | ICD-10-CM

## 2023-06-10 NOTE — Patient Instructions (Signed)
Please keep appointment next week for a progress.  We will check routine labs and MRI of the brain.  Continue physical therapy, send note to [facility] to discuss adding lower extremity PT. follow-up in 6 months

## 2023-06-11 LAB — IGG, IGA, IGM
IgA/Immunoglobulin A, Serum: 190 mg/dL (ref 90–386)
IgG (Immunoglobin G), Serum: 1224 mg/dL (ref 603–1613)
IgM (Immunoglobulin M), Srm: 55 mg/dL (ref 20–172)

## 2023-06-11 LAB — COMPREHENSIVE METABOLIC PANEL
ALT: 12 [IU]/L (ref 0–44)
AST: 16 [IU]/L (ref 0–40)
Albumin: 4.7 g/dL (ref 3.8–4.9)
Alkaline Phosphatase: 68 [IU]/L (ref 44–121)
BUN/Creatinine Ratio: 12 (ref 9–20)
BUN: 17 mg/dL (ref 6–24)
Bilirubin Total: 0.7 mg/dL (ref 0.0–1.2)
CO2: 17 mmol/L — ABNORMAL LOW (ref 20–29)
Calcium: 10.2 mg/dL (ref 8.7–10.2)
Chloride: 107 mmol/L — ABNORMAL HIGH (ref 96–106)
Creatinine, Ser: 1.41 mg/dL — ABNORMAL HIGH (ref 0.76–1.27)
Globulin, Total: 2.5 g/dL (ref 1.5–4.5)
Glucose: 91 mg/dL (ref 70–99)
Potassium: 4.5 mmol/L (ref 3.5–5.2)
Sodium: 142 mmol/L (ref 134–144)
Total Protein: 7.2 g/dL (ref 6.0–8.5)
eGFR: 57 mL/min/{1.73_m2} — ABNORMAL LOW (ref >59)

## 2023-06-11 LAB — CBC WITH DIFFERENTIAL/PLATELET
Basophils Absolute: 0.1 10*3/uL (ref 0.0–0.2)
Basos: 2 %
EOS (ABSOLUTE): 0.1 10*3/uL (ref 0.0–0.4)
Eos: 3 %
Hematocrit: 42.6 % (ref 37.5–51.0)
Hemoglobin: 14.3 g/dL (ref 13.0–17.7)
Immature Grans (Abs): 0 10*3/uL (ref 0.0–0.1)
Immature Granulocytes: 0 %
Lymphocytes Absolute: 2 10*3/uL (ref 0.7–3.1)
Lymphs: 56 %
MCH: 30.2 pg (ref 26.6–33.0)
MCHC: 33.6 g/dL (ref 31.5–35.7)
MCV: 90 fL (ref 79–97)
Monocytes Absolute: 0.3 10*3/uL (ref 0.1–0.9)
Monocytes: 8 %
Neutrophils Absolute: 1.1 10*3/uL — ABNORMAL LOW (ref 1.4–7.0)
Neutrophils: 31 %
Platelets: 224 10*3/uL (ref 150–450)
RBC: 4.73 x10E6/uL (ref 4.14–5.80)
RDW: 13.4 % (ref 11.6–15.4)
WBC: 3.6 10*3/uL (ref 3.4–10.8)

## 2023-06-11 LAB — VITAMIN D 25 HYDROXY (VIT D DEFICIENCY, FRACTURES): Vit D, 25-Hydroxy: 44.2 ng/mL (ref 30.0–100.0)

## 2023-06-16 ENCOUNTER — Telehealth: Payer: Self-pay

## 2023-06-16 NOTE — Telephone Encounter (Signed)
Pt aware send results to [facility] when back in office

## 2023-06-16 NOTE — Telephone Encounter (Signed)
-----   Message from Glean Salvo sent at 06/15/2023  4:54 PM EST ----- Please call, labs are overall stable, continued elevated creatinine 1.41, immunoglobulin levels are normal, vitamin D levels normal. Please send over to [facility]. Thanks

## 2023-06-17 ENCOUNTER — Non-Acute Institutional Stay (HOSPITAL_COMMUNITY): Payer: Medicaid Other

## 2023-06-18 ENCOUNTER — Telehealth: Payer: Self-pay | Admitting: Neurology

## 2023-06-18 NOTE — Telephone Encounter (Signed)
 no auth required sent to [facility] [phone]

## 2023-06-22 ENCOUNTER — Non-Acute Institutional Stay (HOSPITAL_COMMUNITY)
Admission: RE | Admit: 2023-06-22 | Discharge: 2023-06-22 | Disposition: A | Discharge status: Home / Self Care | Payer: Medicaid Other | Source: Ambulatory Visit | Attending: Internal Medicine | Admitting: Internal Medicine

## 2023-06-22 ENCOUNTER — Telehealth: Payer: Self-pay | Admitting: *Deleted

## 2023-06-22 DIAGNOSIS — G35 Multiple sclerosis: Secondary | ICD-10-CM | POA: Insufficient documentation

## 2023-06-22 MED ORDER — DIPHENHYDRAMINE HCL 50 MG/ML IJ SOLN
50.0000 mg | Freq: Once | INTRAMUSCULAR | Status: AC
  Administered 2023-06-22: 50 mg via INTRAVENOUS
  Filled 2023-06-22: qty 1

## 2023-06-22 MED ORDER — METHYLPREDNISOLONE SODIUM SUCC 125 MG IJ SOLR
125.0000 mg | Freq: Once | INTRAMUSCULAR | Status: AC
  Administered 2023-06-22: 125 mg via INTRAVENOUS
  Filled 2023-06-22: qty 2

## 2023-06-22 MED ORDER — SODIUM CHLORIDE 0.9 % IV SOLN
600.0000 mg | Freq: Once | INTRAVENOUS | Status: AC
  Administered 2023-06-22: 600 mg via INTRAVENOUS
  Filled 2023-06-22: qty 20

## 2023-06-22 MED ORDER — FAMOTIDINE IN NACL 20-0.9 MG/50ML-% IV SOLN
20.0000 mg | Freq: Once | INTRAVENOUS | Status: AC
  Administered 2023-06-22: 20 mg via INTRAVENOUS
  Filled 2023-06-22: qty 50

## 2023-06-22 MED ORDER — ACETAMINOPHEN 325 MG PO TABS
650.0000 mg | ORAL_TABLET | Freq: Once | ORAL | Status: AC
  Administered 2023-06-22: 650 mg via ORAL
  Filled 2023-06-22: qty 2

## 2023-06-22 MED ORDER — SODIUM CHLORIDE 0.9 % IV SOLN: INTRAVENOUS | Status: DC | PRN

## 2023-06-22 NOTE — Telephone Encounter (Signed)
-----   Message from Glean Salvo sent at 06/15/2023  4:54 PM EST ----- Please call, labs are overall stable, continued elevated creatinine 1.41, immunoglobulin levels are normal, vitamin D levels normal. Please send over to [facility]. Thanks

## 2023-06-22 NOTE — Telephone Encounter (Signed)
Tried to call pt and VM not set up.

## 2023-06-22 NOTE — Progress Notes (Signed)
 Patient Care [facility] Note   Provider: Margie Ege, NP  Procedure: Emogene Morgan infusion 600mg  in sodium chloride 0.9%   Note: Pt arrived at day hospital for Ocrevus infusion. IV placed in left forearm. Pt was given IV Benadryl 50mg , PO tylenol 650mg , IV solu-medrol 125mg , and IVPB pepcid 20mg , for premeds 30 minutes prior to Ocrevus infusion. Infusion titrated per protocol. Pt tolerated infusion well and vitals remained stable. Pt is alert, oriented and transfers in person motorized wheelchair upon discharge. AVS given to patient. Pt advised to stop at front desk to schedule next infusion for 6 months.

## 2023-06-24 NOTE — Telephone Encounter (Signed)
 Results faxed to [facility], see other message.

## 2023-07-24 ENCOUNTER — Other Ambulatory Visit: Payer: Medicaid Other

## 2023-07-27 ENCOUNTER — Ambulatory Visit
Admission: RE | Admit: 2023-07-27 | Discharge: 2023-07-27 | Disposition: A | Discharge status: Home / Self Care | Payer: Medicaid Other | Source: Ambulatory Visit | Attending: Neurology | Admitting: Neurology

## 2023-07-27 DIAGNOSIS — G35 Multiple sclerosis: Secondary | ICD-10-CM

## 2023-07-27 MED ORDER — GADOPICLENOL 0.5 MMOL/ML IV SOLN
9.0000 mL | Freq: Once | INTRAVENOUS | Status: AC | PRN
  Administered 2023-07-27: 9 mL via INTRAVENOUS

## 2023-12-16 ENCOUNTER — Telehealth: Payer: Self-pay | Admitting: Neurology

## 2023-12-16 ENCOUNTER — Other Ambulatory Visit: Payer: Self-pay | Admitting: Neurology

## 2023-12-16 DIAGNOSIS — G35 Multiple sclerosis: Secondary | ICD-10-CM

## 2023-12-16 NOTE — Progress Notes (Signed)
 SABRA

## 2023-12-16 NOTE — Telephone Encounter (Signed)
 Ocrevus  infusion on August 25th, need order.

## 2023-12-20 ENCOUNTER — Other Ambulatory Visit (HOSPITAL_COMMUNITY): Payer: Self-pay

## 2023-12-20 ENCOUNTER — Non-Acute Institutional Stay (HOSPITAL_COMMUNITY)
Admission: RE | Admit: 2023-12-20 | Discharge: 2023-12-20 | Disposition: A | Discharge status: Home / Self Care | Payer: Medicaid Other | Source: Ambulatory Visit | Attending: Internal Medicine | Admitting: Internal Medicine

## 2023-12-20 DIAGNOSIS — G35 Multiple sclerosis: Secondary | ICD-10-CM | POA: Diagnosis present

## 2023-12-20 MED ORDER — METHYLPREDNISOLONE SODIUM SUCC 125 MG IJ SOLR
125.0000 mg | Freq: Once | INTRAMUSCULAR | Status: AC
  Administered 2023-12-20: 125 mg via INTRAVENOUS
  Filled 2023-12-20: qty 2

## 2023-12-20 MED ORDER — ACETAMINOPHEN 325 MG PO TABS: 650.0000 mg | ORAL_TABLET | Freq: Four times a day (QID) | ORAL | Status: AC | PRN

## 2023-12-20 MED ORDER — METHYLPREDNISOLONE SODIUM SUCC 125 MG IJ SOLR
125.0000 mg | Freq: Once | INTRAMUSCULAR | 0 refills | Status: AC
  Filled 2023-12-20: qty 1, 1d supply, fill #0

## 2023-12-20 MED ORDER — FAMOTIDINE IN NACL 20-0.9 MG/50ML-% IV SOLN
20.0000 mg | Freq: Once | INTRAVENOUS | Status: AC
  Filled 2023-12-20: qty 50

## 2023-12-20 MED ORDER — SODIUM CHLORIDE 0.9 % IV SOLN: INTRAVENOUS | Status: DC | PRN

## 2023-12-20 MED ORDER — SODIUM CHLORIDE 0.9 % IV SOLN
600.0000 mg | Freq: Once | INTRAVENOUS | Status: AC
  Administered 2023-12-20: 600 mg via INTRAVENOUS
  Filled 2023-12-20: qty 20

## 2023-12-20 MED ORDER — ACETAMINOPHEN 325 MG PO TABS
650.0000 mg | ORAL_TABLET | Freq: Once | ORAL | Status: AC
  Administered 2023-12-20: 650 mg via ORAL
  Filled 2023-12-20: qty 2

## 2023-12-20 MED ORDER — DIPHENHYDRAMINE HCL 50 MG/ML IJ SOLN
50.0000 mg | Freq: Once | INTRAMUSCULAR | 0 refills | Status: AC
  Filled 2023-12-20: qty 1, 1d supply, fill #0

## 2023-12-20 MED ORDER — DIPHENHYDRAMINE HCL 50 MG/ML IJ SOLN
50.0000 mg | Freq: Once | INTRAMUSCULAR | Status: AC
  Administered 2023-12-20: 50 mg via INTRAVENOUS
  Filled 2023-12-20: qty 1

## 2023-12-20 MED ORDER — FAMOTIDINE IN NACL 20-0.9 MG/50ML-% IV SOLN
20.0000 mg | Freq: Once | INTRAVENOUS | Status: AC
  Administered 2023-12-20: 20 mg via INTRAVENOUS

## 2023-12-20 NOTE — Progress Notes (Signed)
 PATIENT CARE CENTER NOTE     Diagnosis: Multiple sclerosis (HCC) [G35]      Provider: Onita Duos, MD     Procedure: Ocrevus  infusion      Note: Patient received 600 mg Ocrevus  infusion via PIV. Pre-meds given per order (Pepcid  IVPB, Tylenol  PO, Solumedrol IV and Benadryl  IV). Infusion titrated per protocol. Patient tolerated infusion well with no adverse reaction. Vital signs remained stable. Patient declined to wait the 1 hour post infusion observation. Discharge instructions given. Patient's next Ocrevus  infusion scheduled for 06/21/24. Patient alert, oriented and transfers in personal motorized wheelchair at discharge.

## 2023-12-21 ENCOUNTER — Ambulatory Visit (INDEPENDENT_AMBULATORY_CARE_PROVIDER_SITE_OTHER): Payer: Medicaid Other | Admitting: Neurology

## 2023-12-21 ENCOUNTER — Encounter: Payer: Self-pay | Admitting: Neurology

## 2023-12-21 VITALS — BP 133/86 | HR 53 | Ht 73.0 in | Wt 205.0 lb

## 2023-12-21 DIAGNOSIS — R269 Unspecified abnormalities of gait and mobility: Secondary | ICD-10-CM

## 2023-12-21 DIAGNOSIS — G8250 Quadriplegia, unspecified: Secondary | ICD-10-CM

## 2023-12-21 DIAGNOSIS — G35 Multiple sclerosis: Secondary | ICD-10-CM | POA: Diagnosis not present

## 2023-12-21 MED ORDER — BACLOFEN 10 MG PO TABS: 10.0000 mg | ORAL_TABLET | Freq: Two times a day (BID) | ORAL | 5 refills | Status: AC

## 2023-12-21 NOTE — Patient Instructions (Signed)
 Increase baclofen  10 mg twice daily to help with spasticity on the left side  Continue physical therapy  Discuss bladder issues with [facility], consider going back to urology. Check urine for infection  Follow up in 6 months
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
## 2023-12-23 ENCOUNTER — Ambulatory Visit: Payer: Self-pay | Admitting: Neurology

## 2023-12-23 ENCOUNTER — Telehealth: Payer: Self-pay

## 2023-12-23 LAB — COMPREHENSIVE METABOLIC PANEL WITH GFR
ALT: 15 IU/L (ref 0–44)
AST: 14 IU/L (ref 0–40)
Albumin: 4.7 g/dL (ref 3.8–4.9)
Alkaline Phosphatase: 81 IU/L (ref 44–121)
BUN/Creatinine Ratio: 17 (ref 10–24)
BUN: 26 mg/dL (ref 8–27)
Bilirubin Total: 0.3 mg/dL (ref 0.0–1.2)
CO2: 17 mmol/L — ABNORMAL LOW (ref 20–29)
Calcium: 10.4 mg/dL — ABNORMAL HIGH (ref 8.6–10.2)
Chloride: 106 mmol/L (ref 96–106)
Creatinine, Ser: 1.57 mg/dL — ABNORMAL HIGH (ref 0.76–1.27)
Globulin, Total: 2.6 g/dL (ref 1.5–4.5)
Glucose: 90 mg/dL (ref 70–99)
Potassium: 4.7 mmol/L (ref 3.5–5.2)
Sodium: 141 mmol/L (ref 134–144)
Total Protein: 7.3 g/dL (ref 6.0–8.5)
eGFR: 50 mL/min/1.73 — ABNORMAL LOW (ref >59)

## 2023-12-23 LAB — CBC WITH DIFFERENTIAL/PLATELET
Basophils Absolute: 0 x10E3/uL (ref 0.0–0.2)
Basos: 0 %
EOS (ABSOLUTE): 0 x10E3/uL (ref 0.0–0.4)
Eos: 0 %
Hematocrit: 43.8 % (ref 37.5–51.0)
Hemoglobin: 14.1 g/dL (ref 13.0–17.7)
Immature Grans (Abs): 0 x10E3/uL (ref 0.0–0.1)
Immature Granulocytes: 0 %
Lymphocytes Absolute: 1.9 x10E3/uL (ref 0.7–3.1)
Lymphs: 11 %
MCH: 30.2 pg (ref 26.6–33.0)
MCHC: 32.2 g/dL (ref 31.5–35.7)
MCV: 94 fL (ref 79–97)
Monocytes Absolute: 1.1 x10E3/uL — ABNORMAL HIGH (ref 0.1–0.9)
Monocytes: 7 %
Neutrophils Absolute: 13.7 x10E3/uL — ABNORMAL HIGH (ref 1.4–7.0)
Neutrophils: 82 %
Platelets: 236 x10E3/uL (ref 150–450)
RBC: 4.67 x10E6/uL (ref 4.14–5.80)
RDW: 13 % (ref 11.6–15.4)
WBC: 16.8 x10E3/uL — ABNORMAL HIGH (ref 3.4–10.8)

## 2023-12-23 LAB — IGG, IGA, IGM
IgA/Immunoglobulin A, Serum: 216 mg/dL (ref 90–386)
IgG (Immunoglobin G), Serum: 1488 mg/dL (ref 603–1613)
IgM (Immunoglobulin M), Srm: 66 mg/dL (ref 20–172)

## 2023-12-23 NOTE — Telephone Encounter (Signed)
 Faxed script to [facility] by Nevelyn FALCON, CMA

## 2024-02-11 ENCOUNTER — Other Ambulatory Visit: Payer: Self-pay

## 2024-02-11 DIAGNOSIS — F17200 Nicotine dependence, unspecified, uncomplicated: Secondary | ICD-10-CM

## 2024-04-06 ENCOUNTER — Ambulatory Visit
Admission: RE | Admit: 2024-04-06 | Discharge: 2024-04-06 | Disposition: A | Discharge status: Home / Self Care | Source: Ambulatory Visit

## 2024-04-06 DIAGNOSIS — F17200 Nicotine dependence, unspecified, uncomplicated: Secondary | ICD-10-CM

## 2024-04-12 ENCOUNTER — Other Ambulatory Visit (HOSPITAL_COMMUNITY): Payer: Self-pay | Admitting: Neurology

## 2024-04-17 ENCOUNTER — Other Ambulatory Visit: Payer: Self-pay

## 2024-04-17 DIAGNOSIS — F17200 Nicotine dependence, unspecified, uncomplicated: Secondary | ICD-10-CM

## 2024-05-01 ENCOUNTER — Ambulatory Visit

## 2024-05-01 ENCOUNTER — Telehealth: Payer: Self-pay

## 2024-05-01 VITALS — Ht 73.0 in | Wt 205.0 lb

## 2024-05-01 DIAGNOSIS — Z1211 Encounter for screening for malignant neoplasm of colon: Secondary | ICD-10-CM

## 2024-05-01 NOTE — Telephone Encounter (Signed)
 Spoke with patient & scheduled appointment with Colleen on Monday, 05/08/24 @ 1:30 PM. After review, patient was aslo scheduled for 05/26/24 by [facility] nurse. Called and spoke with patient. Patient requests sooner appointment and would like to keep the one set for 05/08/24. Patient will reach out to [facility] to make sure transportation is not an issue & will call back if this will not work.

## 2024-05-01 NOTE — Telephone Encounter (Signed)
 OV scheduled with [facility] for 05/26/2024

## 2024-05-01 NOTE — Telephone Encounter (Signed)
 thanks

## 2024-05-01 NOTE — Progress Notes (Signed)
 PCP MD at time of PV: [facility] Zipporah is referring MD)  __________________________________________________________________________________________________________________________________________  No egg allergy known to patient  No soy allergy known to patient No issues known to pt with past sedation with any surgeries or procedures Patient denies ever being told they had issues or difficulty with intubation  No FH of Malignant Hyperthermia Pt is not on diet pills Pt is not on  home 02  Pt is not on blood thinners  No A fib or A flutter Have any cardiac testing pending-- no LOA: wheelchair bound  No Chew or Snuff tobacco __________________________________________________________________________________________________________________________________________  Constipation: no taking stool softener as needed  __________________________________________________________________________________________________________________________________________  PV cancelled.OV 1st per MD. Patient is wheelchair bound options for screening colonoscopy will be discussed at that time. Pt is aware. [facility] will be contacted to schedule pt apt.

## 2024-05-01 NOTE — Telephone Encounter (Signed)
 If this exam is purely for screening purposes I would recommend an office visit to discuss his options for screening.  It will be at least a 38-month wait if not longer for screening procedure at the hospital.  Can you please book him an office visit with me or APP.  Thanks

## 2024-05-01 NOTE — Telephone Encounter (Signed)
 This pt had a PV today and is non ambulatory / wheelchair bound. I am going to cancel his procedure for [facility]. Can someone please assist with getting him a procedure over at the hospital.   I can complete his PV today and give a blank copy of instructions until we secure a date.   Thank you

## 2024-05-08 ENCOUNTER — Ambulatory Visit (INDEPENDENT_AMBULATORY_CARE_PROVIDER_SITE_OTHER): Admitting: Nurse Practitioner

## 2024-05-08 ENCOUNTER — Encounter: Payer: Self-pay | Admitting: Nurse Practitioner

## 2024-05-08 VITALS — BP 122/80 | HR 60

## 2024-05-08 DIAGNOSIS — Z1211 Encounter for screening for malignant neoplasm of colon: Secondary | ICD-10-CM

## 2024-05-08 DIAGNOSIS — K5900 Constipation, unspecified: Secondary | ICD-10-CM | POA: Diagnosis not present

## 2024-05-08 NOTE — Patient Instructions (Signed)
 Our office will contact you to schedule a colonoscopy at [facility].   Start Miralax  1 capful at bedtime in 8 ounces of liquid for constipation.   _______________________________________________________  If your blood pressure at your visit was 140/90 or greater, please contact your primary care physician to follow up on this.  _______________________________________________________  If you are age 61 or older, your body mass index should be between 23-30. Your There is no height or weight on file to calculate BMI. If this is out of the aforementioned range listed, please consider follow up with your Primary Care Provider.  If you are age 31 or younger, your body mass index should be between 19-25. Your There is no height or weight on file to calculate BMI. If this is out of the aformentioned range listed, please consider follow up with your Primary Care Provider.   ________________________________________________________  The [facility] providers would like to encourage you to use MYCHART to communicate with providers for non-urgent requests or questions.  Due to long hold times on the telephone, sending your provider a message by [facility] may be a faster and more efficient way to get a response.  Please allow 48 business hours for a response.  Please remember that this is for non-urgent requests.  _______________________________________________________  [facility] is using a team-based approach to care.  Your team is made up of your doctor and two to three APPS. Our APPS (Nurse Practitioners and Physician Assistants) work with your physician to ensure care continuity for you. They are fully qualified to address your health concerns and develop a treatment plan. They communicate directly with your gastroenterologist to care for you. Seeing the Advanced Practice Practitioners on your physician's team can help you by facilitating care more promptly, often allowing for earlier  appointments, access to diagnostic testing, procedures, and other specialty referrals.
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
## 2024-05-09 NOTE — Progress Notes (Signed)
 Agree with assessment and plan as outlined.  While I am happy to offer this patient a colonoscopy, obviously this will need to be done in the hospital and it sounds like he needs inpatient assistance for bowel prep.  With the current census at the hospital and the flu increasing hospital numbers, I am not sure if/when we will be able to do elective admissions for bowel prep and to do a colonoscopy.  Further, this needs to be coordinated with my schedule at a time when I am working in the hospital, earliest would not be until at least February.  Logistics of this can be challenging given the high census of the hospital and need clarity from the hospital standpoint if/when we are able to do elective admissions for bowel preparation and procedures.  POD A RN - I am currently the inpatient physician at [facility] schedule for the first week of February.  Can you check with the hospital to determine if/when they will allow elective hospital admissions for bowel prep to do colonoscopy.  We could potentially admit the patient on Monday of that first week of Feb for bowel prep and procedure on Tuesday a.m.?  I have added the patient to the hospital wait list while we await the details for determining when this may be possible.  Please let the patient know the timing of this is at the mercy of the hospital system which we cannot control in regards to bed availability etc.  Possible he may need to wait a few months until they will allow us  to do it.

## 2024-05-15 ENCOUNTER — Encounter: Admitting: Gastroenterology

## 2024-05-17 ENCOUNTER — Encounter (HOSPITAL_COMMUNITY): Payer: Self-pay | Admitting: Neurology

## 2024-05-26 ENCOUNTER — Ambulatory Visit: Admitting: Gastroenterology

## 2024-05-31 ENCOUNTER — Telehealth (HOSPITAL_COMMUNITY): Payer: Self-pay

## 2024-05-31 NOTE — Telephone Encounter (Signed)
 Auth Submission: APPROVED Site of care: Site of care: [facility] Payer: [employer] Medication & CPT/J Code(s) submitted: Ocrevus  (Ocrelizumab ) J2350 Diagnosis Code: G35.D, G35.B0 Route of submission (phone, fax, portal): phone Phone #[phone] Fax # Auth type: Buy/Bill HB Units/visits requested: 600mg  q2months Reference number: [id number] Approval from: 11/26/23 to 06/24/24

## 2024-06-21 ENCOUNTER — Ambulatory Visit (HOSPITAL_COMMUNITY)

## 2024-06-22 ENCOUNTER — Ambulatory Visit: Admitting: Neurology
